# Patient Record
Sex: Female | Born: 1935 | Race: Black or African American | Hispanic: No | Marital: Single | Smoking: Never smoker
Health system: Southern US, Community
[De-identification: ages and names within clinical notes are randomized; demographics above are authoritative.]

## PROBLEM LIST (undated history)

## (undated) DIAGNOSIS — N183 Chronic kidney disease, stage 3 unspecified: Secondary | ICD-10-CM

## (undated) DIAGNOSIS — I255 Ischemic cardiomyopathy: Secondary | ICD-10-CM

## (undated) DIAGNOSIS — Z950 Presence of cardiac pacemaker: Secondary | ICD-10-CM

## (undated) DIAGNOSIS — E7800 Pure hypercholesterolemia, unspecified: Secondary | ICD-10-CM

## (undated) DIAGNOSIS — N289 Disorder of kidney and ureter, unspecified: Secondary | ICD-10-CM

## (undated) DIAGNOSIS — I350 Nonrheumatic aortic (valve) stenosis: Secondary | ICD-10-CM

## (undated) DIAGNOSIS — R011 Cardiac murmur, unspecified: Secondary | ICD-10-CM

## (undated) DIAGNOSIS — I2510 Atherosclerotic heart disease of native coronary artery without angina pectoris: Secondary | ICD-10-CM

## (undated) DIAGNOSIS — Z8719 Personal history of other diseases of the digestive system: Secondary | ICD-10-CM

## (undated) DIAGNOSIS — R0602 Shortness of breath: Secondary | ICD-10-CM

## (undated) DIAGNOSIS — I495 Sick sinus syndrome: Secondary | ICD-10-CM

## (undated) DIAGNOSIS — I4891 Unspecified atrial fibrillation: Secondary | ICD-10-CM

## (undated) DIAGNOSIS — I340 Nonrheumatic mitral (valve) insufficiency: Secondary | ICD-10-CM

## (undated) DIAGNOSIS — I499 Cardiac arrhythmia, unspecified: Secondary | ICD-10-CM

## (undated) DIAGNOSIS — I509 Heart failure, unspecified: Secondary | ICD-10-CM

## (undated) DIAGNOSIS — D649 Anemia, unspecified: Secondary | ICD-10-CM

## (undated) HISTORY — DX: Ischemic cardiomyopathy: I25.5

## (undated) HISTORY — DX: Nonrheumatic aortic (valve) stenosis: I35.0

## (undated) HISTORY — DX: Nonrheumatic mitral (valve) insufficiency: I34.0

## (undated) HISTORY — DX: Disorder of kidney and ureter, unspecified: N28.9

## (undated) HISTORY — PX: INSERT / REPLACE / REMOVE PACEMAKER: SUR710

---

## 1979-03-24 HISTORY — PX: CORONARY ARTERY BYPASS GRAFT: SHX141

## 1998-03-30 ENCOUNTER — Encounter: Payer: Self-pay | Admitting: Family Medicine

## 1998-03-30 ENCOUNTER — Ambulatory Visit (HOSPITAL_COMMUNITY): Admission: RE | Admit: 1998-03-30 | Discharge: 1998-03-30 | Payer: Self-pay | Admitting: Family Medicine

## 1998-12-29 ENCOUNTER — Ambulatory Visit (HOSPITAL_COMMUNITY): Admission: RE | Admit: 1998-12-29 | Discharge: 1998-12-29 | Payer: Self-pay | Admitting: Obstetrics & Gynecology

## 1999-04-12 ENCOUNTER — Ambulatory Visit (HOSPITAL_COMMUNITY): Admission: RE | Admit: 1999-04-12 | Discharge: 1999-04-12 | Payer: Self-pay | Admitting: Family Medicine

## 1999-04-12 ENCOUNTER — Encounter: Payer: Self-pay | Admitting: Family Medicine

## 2000-04-15 ENCOUNTER — Encounter: Payer: Self-pay | Admitting: Family Medicine

## 2000-04-15 ENCOUNTER — Ambulatory Visit (HOSPITAL_COMMUNITY): Admission: RE | Admit: 2000-04-15 | Discharge: 2000-04-15 | Payer: Self-pay | Admitting: Family Medicine

## 2001-04-18 ENCOUNTER — Encounter: Payer: Self-pay | Admitting: Family Medicine

## 2001-04-18 ENCOUNTER — Ambulatory Visit (HOSPITAL_COMMUNITY): Admission: RE | Admit: 2001-04-18 | Discharge: 2001-04-18 | Payer: Self-pay | Admitting: Family Medicine

## 2001-10-16 ENCOUNTER — Encounter: Admission: RE | Admit: 2001-10-16 | Discharge: 2001-10-16 | Payer: Self-pay | Admitting: Family Medicine

## 2002-01-20 HISTORY — PX: URETEROLITHOTOMY: SHX71

## 2002-02-19 ENCOUNTER — Ambulatory Visit (HOSPITAL_COMMUNITY): Admission: RE | Admit: 2002-02-19 | Discharge: 2002-02-19 | Payer: Self-pay | Admitting: Urology

## 2002-02-19 ENCOUNTER — Encounter: Payer: Self-pay | Admitting: Urology

## 2002-04-20 ENCOUNTER — Encounter: Payer: Self-pay | Admitting: Family Medicine

## 2002-04-20 ENCOUNTER — Ambulatory Visit (HOSPITAL_COMMUNITY): Admission: RE | Admit: 2002-04-20 | Discharge: 2002-04-20 | Payer: Self-pay | Admitting: Family Medicine

## 2003-04-16 ENCOUNTER — Other Ambulatory Visit: Admission: RE | Admit: 2003-04-16 | Discharge: 2003-04-16 | Payer: Self-pay | Admitting: Family Medicine

## 2003-04-22 ENCOUNTER — Ambulatory Visit (HOSPITAL_COMMUNITY): Admission: RE | Admit: 2003-04-22 | Discharge: 2003-04-22 | Payer: Self-pay | Admitting: Family Medicine

## 2003-04-22 ENCOUNTER — Encounter: Payer: Self-pay | Admitting: Family Medicine

## 2004-04-24 ENCOUNTER — Ambulatory Visit (HOSPITAL_COMMUNITY): Admission: RE | Admit: 2004-04-24 | Discharge: 2004-04-24 | Payer: Self-pay | Admitting: Family Medicine

## 2004-05-03 ENCOUNTER — Encounter (INDEPENDENT_AMBULATORY_CARE_PROVIDER_SITE_OTHER): Payer: Self-pay | Admitting: *Deleted

## 2004-05-03 ENCOUNTER — Encounter: Admission: RE | Admit: 2004-05-03 | Discharge: 2004-05-03 | Payer: Self-pay | Admitting: Family Medicine

## 2004-05-09 ENCOUNTER — Encounter (HOSPITAL_COMMUNITY): Admission: RE | Admit: 2004-05-09 | Discharge: 2004-08-07 | Payer: Self-pay | Admitting: General Surgery

## 2004-05-18 ENCOUNTER — Encounter: Admission: RE | Admit: 2004-05-18 | Discharge: 2004-05-18 | Payer: Self-pay | Admitting: General Surgery

## 2004-05-22 ENCOUNTER — Encounter: Admission: RE | Admit: 2004-05-22 | Discharge: 2004-05-22 | Payer: Self-pay | Admitting: General Surgery

## 2004-05-22 ENCOUNTER — Ambulatory Visit (HOSPITAL_COMMUNITY): Admission: RE | Admit: 2004-05-22 | Discharge: 2004-05-22 | Payer: Self-pay | Admitting: General Surgery

## 2004-05-22 ENCOUNTER — Ambulatory Visit (HOSPITAL_BASED_OUTPATIENT_CLINIC_OR_DEPARTMENT_OTHER): Admission: RE | Admit: 2004-05-22 | Discharge: 2004-05-22 | Payer: Self-pay | Admitting: General Surgery

## 2004-05-22 ENCOUNTER — Encounter (INDEPENDENT_AMBULATORY_CARE_PROVIDER_SITE_OTHER): Payer: Self-pay | Admitting: Specialist

## 2004-05-23 HISTORY — PX: OTHER SURGICAL HISTORY: SHX169

## 2004-05-26 ENCOUNTER — Other Ambulatory Visit: Admission: RE | Admit: 2004-05-26 | Discharge: 2004-05-26 | Payer: Self-pay | Admitting: Family Medicine

## 2004-06-09 ENCOUNTER — Encounter (INDEPENDENT_AMBULATORY_CARE_PROVIDER_SITE_OTHER): Payer: Self-pay | Admitting: Specialist

## 2004-06-09 ENCOUNTER — Ambulatory Visit (HOSPITAL_COMMUNITY): Admission: RE | Admit: 2004-06-09 | Discharge: 2004-06-09 | Payer: Self-pay | Admitting: General Surgery

## 2005-04-22 ENCOUNTER — Emergency Department (HOSPITAL_COMMUNITY): Admission: EM | Admit: 2005-04-22 | Discharge: 2005-04-22 | Payer: Self-pay | Admitting: Family Medicine

## 2005-04-25 ENCOUNTER — Encounter: Admission: RE | Admit: 2005-04-25 | Discharge: 2005-04-25 | Payer: Self-pay | Admitting: General Surgery

## 2005-07-24 ENCOUNTER — Encounter: Admission: RE | Admit: 2005-07-24 | Discharge: 2005-07-24 | Payer: Self-pay | Admitting: Family Medicine

## 2005-07-30 ENCOUNTER — Encounter: Admission: RE | Admit: 2005-07-30 | Discharge: 2005-07-30 | Payer: Self-pay | Admitting: Family Medicine

## 2005-08-10 ENCOUNTER — Encounter: Admission: RE | Admit: 2005-08-10 | Discharge: 2005-08-10 | Payer: Self-pay | Admitting: Family Medicine

## 2005-09-13 ENCOUNTER — Ambulatory Visit: Payer: Self-pay | Admitting: Oncology

## 2005-10-29 ENCOUNTER — Encounter: Admission: RE | Admit: 2005-10-29 | Discharge: 2005-10-29 | Payer: Self-pay | Admitting: Oncology

## 2006-01-21 ENCOUNTER — Inpatient Hospital Stay (HOSPITAL_COMMUNITY): Admission: RE | Admit: 2006-01-21 | Discharge: 2006-01-26 | Payer: Self-pay | Admitting: Neurosurgery

## 2006-01-22 ENCOUNTER — Ambulatory Visit: Payer: Self-pay | Admitting: Physical Medicine & Rehabilitation

## 2006-03-08 ENCOUNTER — Ambulatory Visit: Payer: Self-pay | Admitting: Oncology

## 2006-03-15 LAB — CBC WITH DIFFERENTIAL/PLATELET
Basophils Absolute: 0 10*3/uL (ref 0.0–0.1)
Eosinophils Absolute: 0.2 10*3/uL (ref 0.0–0.5)
HCT: 29.8 % — ABNORMAL LOW (ref 34.8–46.6)
HGB: 9.9 g/dL — ABNORMAL LOW (ref 11.6–15.9)
LYMPH%: 26.6 % (ref 14.0–48.0)
MCV: 92.7 fL (ref 81.0–101.0)
MONO#: 0.4 10*3/uL (ref 0.1–0.9)
MONO%: 7.5 % (ref 0.0–13.0)
NEUT#: 3.5 10*3/uL (ref 1.5–6.5)
NEUT%: 62.4 % (ref 39.6–76.8)
Platelets: 340 10*3/uL (ref 145–400)
WBC: 5.6 10*3/uL (ref 3.9–10.0)

## 2006-03-16 LAB — COMPREHENSIVE METABOLIC PANEL
BUN: 13 mg/dL (ref 6–23)
CO2: 25 mEq/L (ref 19–32)
Glucose, Bld: 112 mg/dL — ABNORMAL HIGH (ref 70–99)
Sodium: 143 mEq/L (ref 135–145)
Total Bilirubin: 0.6 mg/dL (ref 0.3–1.2)
Total Protein: 6.6 g/dL (ref 6.0–8.3)

## 2006-03-16 LAB — FERRITIN: Ferritin: 165 ng/mL (ref 10–291)

## 2006-03-16 LAB — IRON AND TIBC
Iron: 52 ug/dL (ref 42–145)
TIBC: 243 ug/dL — ABNORMAL LOW (ref 250–470)

## 2006-03-16 LAB — CANCER ANTIGEN 27.29: CA 27.29: 26 U/mL (ref 0–39)

## 2006-05-01 ENCOUNTER — Encounter: Admission: RE | Admit: 2006-05-01 | Discharge: 2006-05-01 | Payer: Self-pay | Admitting: General Surgery

## 2006-09-09 ENCOUNTER — Ambulatory Visit: Payer: Self-pay | Admitting: Oncology

## 2006-09-12 LAB — COMPREHENSIVE METABOLIC PANEL
ALT: 29 U/L (ref 0–35)
AST: 30 U/L (ref 0–37)
Albumin: 4.1 g/dL (ref 3.5–5.2)
CO2: 23 mEq/L (ref 19–32)
Calcium: 9 mg/dL (ref 8.4–10.5)
Chloride: 111 mEq/L (ref 96–112)
Creatinine, Ser: 0.82 mg/dL (ref 0.40–1.20)
Potassium: 3.8 mEq/L (ref 3.5–5.3)
Total Protein: 6.6 g/dL (ref 6.0–8.3)

## 2006-09-12 LAB — CBC WITH DIFFERENTIAL/PLATELET
Basophils Absolute: 0.1 10*3/uL (ref 0.0–0.1)
EOS%: 2.5 % (ref 0.0–7.0)
HCT: 30.5 % — ABNORMAL LOW (ref 34.8–46.6)
HGB: 10.3 g/dL — ABNORMAL LOW (ref 11.6–15.9)
MCH: 31.2 pg (ref 26.0–34.0)
MCHC: 33.6 g/dL (ref 32.0–36.0)
MCV: 92.9 fL (ref 81.0–101.0)
MONO%: 5.5 % (ref 0.0–13.0)
NEUT%: 67.2 % (ref 39.6–76.8)
RDW: 15.6 % — ABNORMAL HIGH (ref 11.3–14.5)

## 2006-09-12 LAB — CANCER ANTIGEN 27.29: CA 27.29: 22 U/mL (ref 0–39)

## 2006-09-12 LAB — IRON AND TIBC: TIBC: 286 ug/dL (ref 250–470)

## 2006-11-28 ENCOUNTER — Ambulatory Visit (HOSPITAL_COMMUNITY): Admission: RE | Admit: 2006-11-28 | Discharge: 2006-11-28 | Payer: Self-pay | Admitting: Cardiology

## 2007-05-09 ENCOUNTER — Encounter: Admission: RE | Admit: 2007-05-09 | Discharge: 2007-05-09 | Payer: Self-pay | Admitting: General Surgery

## 2007-09-09 ENCOUNTER — Ambulatory Visit: Payer: Self-pay | Admitting: Oncology

## 2007-09-11 LAB — COMPREHENSIVE METABOLIC PANEL
AST: 20 U/L (ref 0–37)
Albumin: 4 g/dL (ref 3.5–5.2)
Alkaline Phosphatase: 57 U/L (ref 39–117)
Calcium: 8.9 mg/dL (ref 8.4–10.5)
Chloride: 108 mEq/L (ref 96–112)
Potassium: 3.7 mEq/L (ref 3.5–5.3)
Sodium: 143 mEq/L (ref 135–145)
Total Protein: 6.5 g/dL (ref 6.0–8.3)

## 2007-09-11 LAB — CBC & DIFF AND RETIC
Basophils Absolute: 0.1 10*3/uL (ref 0.0–0.1)
Eosinophils Absolute: 0.2 10*3/uL (ref 0.0–0.5)
HGB: 11.5 g/dL — ABNORMAL LOW (ref 11.6–15.9)
LYMPH%: 25.4 % (ref 14.0–48.0)
MCV: 91.7 fL (ref 81.0–101.0)
MONO#: 0.4 10*3/uL (ref 0.1–0.9)
NEUT#: 3.5 10*3/uL (ref 1.5–6.5)
Platelets: 293 10*3/uL (ref 145–400)
RBC: 3.71 10*6/uL (ref 3.70–5.32)
RDW: 15.6 % — ABNORMAL HIGH (ref 11.3–14.5)
RETIC #: 30.4 10*3/uL (ref 19.7–115.1)
Retic %: 0.8 % (ref 0.4–2.3)
WBC: 5.5 10*3/uL (ref 3.9–10.0)

## 2008-05-10 ENCOUNTER — Encounter: Admission: RE | Admit: 2008-05-10 | Discharge: 2008-05-10 | Payer: Self-pay | Admitting: General Surgery

## 2008-09-07 ENCOUNTER — Ambulatory Visit: Payer: Self-pay | Admitting: Oncology

## 2008-11-03 ENCOUNTER — Ambulatory Visit: Payer: Self-pay | Admitting: Oncology

## 2008-11-05 LAB — COMPREHENSIVE METABOLIC PANEL
Alkaline Phosphatase: 46 U/L (ref 39–117)
BUN: 15 mg/dL (ref 6–23)
CO2: 24 mEq/L (ref 19–32)
Glucose, Bld: 93 mg/dL (ref 70–99)
Sodium: 144 mEq/L (ref 135–145)
Total Bilirubin: 0.9 mg/dL (ref 0.3–1.2)
Total Protein: 6.4 g/dL (ref 6.0–8.3)

## 2008-11-05 LAB — CBC WITH DIFFERENTIAL/PLATELET
Basophils Absolute: 0 10*3/uL (ref 0.0–0.1)
Eosinophils Absolute: 0.1 10*3/uL (ref 0.0–0.5)
HCT: 33.3 % — ABNORMAL LOW (ref 34.8–46.6)
HGB: 11 g/dL — ABNORMAL LOW (ref 11.6–15.9)
LYMPH%: 27.6 % (ref 14.0–49.7)
MCV: 94.1 fL (ref 79.5–101.0)
MONO#: 0.2 10*3/uL (ref 0.1–0.9)
MONO%: 3.6 % (ref 0.0–14.0)
NEUT#: 3.1 10*3/uL (ref 1.5–6.5)
NEUT%: 65.6 % (ref 38.4–76.8)
Platelets: 180 10*3/uL (ref 145–400)
WBC: 4.7 10*3/uL (ref 3.9–10.3)

## 2009-03-27 ENCOUNTER — Ambulatory Visit: Payer: Self-pay | Admitting: Internal Medicine

## 2009-03-27 ENCOUNTER — Inpatient Hospital Stay (HOSPITAL_COMMUNITY): Admission: EM | Admit: 2009-03-27 | Discharge: 2009-03-31 | Payer: Self-pay | Admitting: Emergency Medicine

## 2009-03-29 ENCOUNTER — Encounter (INDEPENDENT_AMBULATORY_CARE_PROVIDER_SITE_OTHER): Payer: Self-pay | Admitting: Cardiology

## 2009-03-30 HISTORY — PX: PACEMAKER INSERTION: SHX728

## 2009-03-31 ENCOUNTER — Encounter: Payer: Self-pay | Admitting: Internal Medicine

## 2009-04-07 ENCOUNTER — Encounter: Payer: Self-pay | Admitting: Internal Medicine

## 2009-04-14 ENCOUNTER — Encounter: Payer: Self-pay | Admitting: Internal Medicine

## 2009-04-14 ENCOUNTER — Ambulatory Visit: Payer: Self-pay

## 2009-05-06 ENCOUNTER — Ambulatory Visit: Payer: Self-pay | Admitting: Oncology

## 2009-05-10 LAB — CBC WITH DIFFERENTIAL/PLATELET
BASO%: 1 % (ref 0.0–2.0)
EOS%: 1.5 % (ref 0.0–7.0)
HGB: 11.3 g/dL — ABNORMAL LOW (ref 11.6–15.9)
MCH: 32.7 pg (ref 25.1–34.0)
MCHC: 33.8 g/dL (ref 31.5–36.0)
RBC: 3.45 10*6/uL — ABNORMAL LOW (ref 3.70–5.45)
RDW: 16.4 % — ABNORMAL HIGH (ref 11.2–14.5)
lymph#: 0.9 10*3/uL (ref 0.9–3.3)

## 2009-05-10 LAB — COMPREHENSIVE METABOLIC PANEL
Alkaline Phosphatase: 36 U/L — ABNORMAL LOW (ref 39–117)
CO2: 21 mEq/L (ref 19–32)
Creatinine, Ser: 1.18 mg/dL (ref 0.40–1.20)
Glucose, Bld: 137 mg/dL — ABNORMAL HIGH (ref 70–99)
Sodium: 143 mEq/L (ref 135–145)
Total Bilirubin: 0.9 mg/dL (ref 0.3–1.2)
Total Protein: 6 g/dL (ref 6.0–8.3)

## 2009-05-11 ENCOUNTER — Ambulatory Visit: Payer: Self-pay | Admitting: Internal Medicine

## 2009-05-11 ENCOUNTER — Encounter: Admission: RE | Admit: 2009-05-11 | Discharge: 2009-05-11 | Payer: Self-pay | Admitting: Oncology

## 2009-05-11 DIAGNOSIS — I472 Ventricular tachycardia, unspecified: Secondary | ICD-10-CM | POA: Insufficient documentation

## 2009-05-11 DIAGNOSIS — R259 Unspecified abnormal involuntary movements: Secondary | ICD-10-CM | POA: Insufficient documentation

## 2009-05-11 DIAGNOSIS — I428 Other cardiomyopathies: Secondary | ICD-10-CM | POA: Insufficient documentation

## 2009-05-11 DIAGNOSIS — I4821 Permanent atrial fibrillation: Secondary | ICD-10-CM

## 2009-05-17 LAB — CONVERTED CEMR LAB: TSH: 1.57 microintl units/mL (ref 0.35–5.50)

## 2009-05-24 ENCOUNTER — Ambulatory Visit: Payer: Self-pay | Admitting: Internal Medicine

## 2009-06-09 ENCOUNTER — Ambulatory Visit: Payer: Self-pay | Admitting: Internal Medicine

## 2009-06-09 DIAGNOSIS — I5032 Chronic diastolic (congestive) heart failure: Secondary | ICD-10-CM

## 2009-06-28 ENCOUNTER — Encounter (INDEPENDENT_AMBULATORY_CARE_PROVIDER_SITE_OTHER): Payer: Self-pay | Admitting: *Deleted

## 2009-06-28 LAB — CONVERTED CEMR LAB
AST: 40 units/L — ABNORMAL HIGH (ref 0–37)
Alkaline Phosphatase: 55 units/L (ref 39–117)
Basophils Absolute: 0.1 10*3/uL (ref 0.0–0.1)
Bilirubin, Direct: 0.2 mg/dL (ref 0.0–0.3)
GFR calc non Af Amer: 60.05 mL/min (ref >60)
Glucose, Bld: 109 mg/dL — ABNORMAL HIGH (ref 70–99)
HCT: 36.2 % (ref 36.0–46.0)
Lymphs Abs: 0.8 10*3/uL (ref 0.7–4.0)
MCHC: 33.3 g/dL (ref 30.0–36.0)
MCV: 99.4 fL (ref 78.0–100.0)
Monocytes Absolute: 0.2 10*3/uL (ref 0.1–1.0)
Neutro Abs: 7.5 10*3/uL (ref 1.4–7.7)
Platelets: 311 10*3/uL (ref 150.0–400.0)
Potassium: 3.1 meq/L — ABNORMAL LOW (ref 3.5–5.1)
RDW: 14.2 % (ref 11.5–14.6)
Sodium: 143 meq/L (ref 135–145)
Total Bilirubin: 1.2 mg/dL (ref 0.3–1.2)

## 2009-07-01 ENCOUNTER — Encounter: Admission: RE | Admit: 2009-07-01 | Discharge: 2009-07-01 | Payer: Self-pay | Admitting: Cardiology

## 2009-07-04 ENCOUNTER — Ambulatory Visit: Payer: Self-pay | Admitting: Internal Medicine

## 2009-07-04 DIAGNOSIS — I498 Other specified cardiac arrhythmias: Secondary | ICD-10-CM | POA: Insufficient documentation

## 2009-07-04 LAB — CONVERTED CEMR LAB
Calcium: 8.7 mg/dL (ref 8.4–10.5)
Creatinine, Ser: 1 mg/dL (ref 0.4–1.2)
GFR calc non Af Amer: 67.02 mL/min (ref >60)
Sodium: 145 meq/L (ref 135–145)

## 2010-02-20 ENCOUNTER — Encounter (INDEPENDENT_AMBULATORY_CARE_PROVIDER_SITE_OTHER): Payer: Self-pay | Admitting: *Deleted

## 2010-04-21 ENCOUNTER — Encounter (INDEPENDENT_AMBULATORY_CARE_PROVIDER_SITE_OTHER): Payer: Self-pay | Admitting: *Deleted

## 2010-05-15 ENCOUNTER — Encounter: Admission: RE | Admit: 2010-05-15 | Discharge: 2010-05-15 | Payer: Self-pay | Admitting: Family Medicine

## 2010-08-12 ENCOUNTER — Encounter: Payer: Self-pay | Admitting: Family Medicine

## 2010-08-18 ENCOUNTER — Encounter
Admission: RE | Admit: 2010-08-18 | Discharge: 2010-08-18 | Payer: Self-pay | Source: Home / Self Care | Attending: Family Medicine | Admitting: Family Medicine

## 2010-08-22 NOTE — Letter (Signed)
Summary: Device-Delinquent Check  [facility], Main Office  [address]   [place], [place] [address]   Phone: [phone]  Fax: [phone]     April 21, 2010 MRN: [id number]   [name] [address] [place], [place]  [address]   Dear Ms. Tadlock,  According to our records, you have not had your implanted device checked in the recommended period of time.  We are unable to determine appropriate device function without checking your device on a regular basis.  Please call our office to schedule an appointment with Dr Johney Frame ,  as soon as possible.  If you are having your device checked by another physician, please call us so that we may update our records.  Thank you,  Letta Moynahan, EMT  April 21, 2010 9:49 AM  [facility]

## 2010-08-22 NOTE — Letter (Signed)
Summary: Appointment - Reminder 2  [facility], Main Office  [address]   [place], [place] [address]   Phone: [phone]  Fax: [phone]     February 20, 2010 MRN: [id number]   NINFA GIANNELLI [address] [place], [place]  [address]   Dear Ms. Dennen,  Our records indicate that it is time to schedule a follow-up appointment.  Dr.Allred recommended that you follow up with [place] in June 2011. It is very important that we reach you to schedule this appointment. We look forward to participating in your health care needs. Please contact us at the number listed above at your earliest convenience to schedule your appointment.  If you are unable to make an appointment at this time, give [place] a call so we can update our records.     Sincerely,  Ruel Favors Scheduling Team

## 2010-08-23 HISTORY — PX: EXPLORATORY LAPAROTOMY W/ BOWEL RESECTION: SHX1544

## 2010-08-30 ENCOUNTER — Emergency Department (HOSPITAL_COMMUNITY): Payer: MEDICARE

## 2010-08-30 ENCOUNTER — Inpatient Hospital Stay (HOSPITAL_COMMUNITY)
Admission: EM | Admit: 2010-08-30 | Discharge: 2010-09-18 | DRG: 329 | Disposition: A | Discharge status: Skilled Nursing Facility | Payer: MEDICARE | Attending: Family Medicine | Admitting: Family Medicine

## 2010-08-30 DIAGNOSIS — I472 Ventricular tachycardia, unspecified: Secondary | ICD-10-CM | POA: Diagnosis not present

## 2010-08-30 DIAGNOSIS — N289 Disorder of kidney and ureter, unspecified: Secondary | ICD-10-CM | POA: Diagnosis not present

## 2010-08-30 DIAGNOSIS — A419 Sepsis, unspecified organism: Secondary | ICD-10-CM | POA: Diagnosis not present

## 2010-08-30 DIAGNOSIS — E876 Hypokalemia: Secondary | ICD-10-CM | POA: Diagnosis not present

## 2010-08-30 DIAGNOSIS — E785 Hyperlipidemia, unspecified: Secondary | ICD-10-CM | POA: Diagnosis present

## 2010-08-30 DIAGNOSIS — I2589 Other forms of chronic ischemic heart disease: Secondary | ICD-10-CM | POA: Diagnosis present

## 2010-08-30 DIAGNOSIS — I2510 Atherosclerotic heart disease of native coronary artery without angina pectoris: Secondary | ICD-10-CM | POA: Diagnosis present

## 2010-08-30 DIAGNOSIS — R5381 Other malaise: Secondary | ICD-10-CM | POA: Diagnosis not present

## 2010-08-30 DIAGNOSIS — D62 Acute posthemorrhagic anemia: Secondary | ICD-10-CM | POA: Diagnosis not present

## 2010-08-30 DIAGNOSIS — J9600 Acute respiratory failure, unspecified whether with hypoxia or hypercapnia: Secondary | ICD-10-CM | POA: Diagnosis not present

## 2010-08-30 DIAGNOSIS — R6520 Severe sepsis without septic shock: Secondary | ICD-10-CM | POA: Diagnosis not present

## 2010-08-30 DIAGNOSIS — K659 Peritonitis, unspecified: Secondary | ICD-10-CM | POA: Diagnosis not present

## 2010-08-30 DIAGNOSIS — D72829 Elevated white blood cell count, unspecified: Secondary | ICD-10-CM | POA: Diagnosis present

## 2010-08-30 DIAGNOSIS — I10 Essential (primary) hypertension: Secondary | ICD-10-CM | POA: Diagnosis present

## 2010-08-30 DIAGNOSIS — I5043 Acute on chronic combined systolic (congestive) and diastolic (congestive) heart failure: Secondary | ICD-10-CM | POA: Diagnosis not present

## 2010-08-30 DIAGNOSIS — K560 Paralytic ileus: Secondary | ICD-10-CM | POA: Diagnosis not present

## 2010-08-30 DIAGNOSIS — N179 Acute kidney failure, unspecified: Secondary | ICD-10-CM | POA: Diagnosis not present

## 2010-08-30 DIAGNOSIS — J189 Pneumonia, unspecified organism: Secondary | ICD-10-CM | POA: Diagnosis not present

## 2010-08-30 DIAGNOSIS — I4891 Unspecified atrial fibrillation: Secondary | ICD-10-CM | POA: Diagnosis present

## 2010-08-30 DIAGNOSIS — I359 Nonrheumatic aortic valve disorder, unspecified: Secondary | ICD-10-CM | POA: Diagnosis present

## 2010-08-30 DIAGNOSIS — I4729 Other ventricular tachycardia: Secondary | ICD-10-CM | POA: Diagnosis not present

## 2010-08-30 DIAGNOSIS — Z7901 Long term (current) use of anticoagulants: Secondary | ICD-10-CM

## 2010-08-30 DIAGNOSIS — Z950 Presence of cardiac pacemaker: Secondary | ICD-10-CM

## 2010-08-30 DIAGNOSIS — K559 Vascular disorder of intestine, unspecified: Principal | ICD-10-CM | POA: Diagnosis present

## 2010-08-30 DIAGNOSIS — I509 Heart failure, unspecified: Secondary | ICD-10-CM | POA: Diagnosis present

## 2010-08-30 DIAGNOSIS — Z951 Presence of aortocoronary bypass graft: Secondary | ICD-10-CM

## 2010-08-30 DIAGNOSIS — E43 Unspecified severe protein-calorie malnutrition: Secondary | ICD-10-CM | POA: Diagnosis not present

## 2010-08-30 LAB — DIFFERENTIAL
Basophils Absolute: 0 10*3/uL (ref 0.0–0.1)
Basophils Relative: 0 % (ref 0–1)
Eosinophils Relative: 0 % (ref 0–5)
Lymphocytes Relative: 12 % (ref 12–46)
Neutro Abs: 8 10*3/uL — ABNORMAL HIGH (ref 1.7–7.7)

## 2010-08-30 LAB — LACTIC ACID, PLASMA: Lactic Acid, Venous: 2.5 mmol/L — ABNORMAL HIGH (ref 0.5–2.2)

## 2010-08-30 LAB — TROPONIN I: Troponin I: 0.01 ng/mL (ref 0.00–0.06)

## 2010-08-30 LAB — POCT CARDIAC MARKERS: Myoglobin, poc: 72.5 ng/mL (ref 12–200)

## 2010-08-30 LAB — URINALYSIS, ROUTINE W REFLEX MICROSCOPIC
Bilirubin Urine: NEGATIVE
Hgb urine dipstick: NEGATIVE
Ketones, ur: 15 mg/dL — AB
Nitrite: NEGATIVE
Protein, ur: NEGATIVE mg/dL
Specific Gravity, Urine: 1.014 (ref 1.005–1.030)
Urobilinogen, UA: 1 mg/dL (ref 0.0–1.0)

## 2010-08-30 LAB — CBC
HCT: 30.7 % — ABNORMAL LOW (ref 36.0–46.0)
Hemoglobin: 9.9 g/dL — ABNORMAL LOW (ref 12.0–15.0)
MCHC: 32.2 g/dL (ref 30.0–36.0)
RDW: 15.2 % (ref 11.5–15.5)
WBC: 9.6 10*3/uL (ref 4.0–10.5)

## 2010-08-30 LAB — CK TOTAL AND CKMB (NOT AT ARMC)
CK, MB: 1.2 ng/mL (ref 0.3–4.0)
Relative Index: INVALID (ref 0.0–2.5)
Total CK: 99 U/L (ref 7–177)

## 2010-08-30 LAB — COMPREHENSIVE METABOLIC PANEL
Albumin: 3.2 g/dL — ABNORMAL LOW (ref 3.5–5.2)
Alkaline Phosphatase: 85 U/L (ref 39–117)
BUN: 25 mg/dL — ABNORMAL HIGH (ref 6–23)
Calcium: 9.1 mg/dL (ref 8.4–10.5)
Glucose, Bld: 191 mg/dL — ABNORMAL HIGH (ref 70–99)
Potassium: 4.6 mEq/L (ref 3.5–5.1)
Total Protein: 6.7 g/dL (ref 6.0–8.3)

## 2010-08-30 LAB — LIPASE, BLOOD: Lipase: 16 U/L (ref 11–59)

## 2010-08-30 LAB — APTT: aPTT: 31 seconds (ref 24–37)

## 2010-08-30 LAB — PROTIME-INR
INR: 2.09 — ABNORMAL HIGH (ref 0.00–1.49)
Prothrombin Time: 23.6 seconds — ABNORMAL HIGH (ref 11.6–15.2)

## 2010-08-30 LAB — BRAIN NATRIURETIC PEPTIDE: Pro B Natriuretic peptide (BNP): 1023 pg/mL — ABNORMAL HIGH (ref 0.0–100.0)

## 2010-08-31 ENCOUNTER — Inpatient Hospital Stay (HOSPITAL_COMMUNITY): Payer: MEDICARE

## 2010-08-31 ENCOUNTER — Other Ambulatory Visit: Payer: Self-pay | Admitting: General Surgery

## 2010-08-31 LAB — PROTIME-INR
INR: 3.81 — ABNORMAL HIGH (ref 0.00–1.49)
INR: 1.92 — ABNORMAL HIGH (ref 0.00–1.49)
Prothrombin Time: 22.1 seconds — ABNORMAL HIGH (ref 11.6–15.2)

## 2010-08-31 LAB — GLUCOSE, CAPILLARY
Glucose-Capillary: 143 mg/dL — ABNORMAL HIGH (ref 70–99)
Glucose-Capillary: 128 mg/dL — ABNORMAL HIGH (ref 70–99)

## 2010-08-31 LAB — BASIC METABOLIC PANEL
Calcium: 8 mg/dL — ABNORMAL LOW (ref 8.4–10.5)
GFR calc Af Amer: 36 mL/min — ABNORMAL LOW (ref >60)
GFR calc non Af Amer: 30 mL/min — ABNORMAL LOW (ref >60)
Glucose, Bld: 135 mg/dL — ABNORMAL HIGH (ref 70–99)
Potassium: 3.9 mEq/L (ref 3.5–5.1)
Sodium: 138 mEq/L (ref 135–145)

## 2010-08-31 LAB — COMPREHENSIVE METABOLIC PANEL
ALT: 22 U/L (ref 0–35)
AST: 34 U/L (ref 0–37)
Albumin: 2.7 g/dL — ABNORMAL LOW (ref 3.5–5.2)
CO2: 26 mEq/L (ref 19–32)
Calcium: 8.8 mg/dL (ref 8.4–10.5)
Chloride: 103 mEq/L (ref 96–112)
Creatinine, Ser: 1.61 mg/dL — ABNORMAL HIGH (ref 0.4–1.2)
GFR calc Af Amer: 36 mL/min — ABNORMAL LOW (ref >60)
Sodium: 141 mEq/L (ref 135–145)
Total Bilirubin: 1.1 mg/dL (ref 0.3–1.2)

## 2010-08-31 LAB — SURGICAL PCR SCREEN
MRSA, PCR: NEGATIVE
Staphylococcus aureus: NEGATIVE

## 2010-08-31 LAB — CBC
HCT: 29.8 % — ABNORMAL LOW (ref 36.0–46.0)
HCT: 26 % — ABNORMAL LOW (ref 36.0–46.0)
Hemoglobin: 8.3 g/dL — ABNORMAL LOW (ref 12.0–15.0)
MCV: 97.1 fL (ref 78.0–100.0)
RBC: 3.07 MIL/uL — ABNORMAL LOW (ref 3.87–5.11)
RBC: 2.65 MIL/uL — ABNORMAL LOW (ref 3.87–5.11)
RDW: 15.1 % (ref 11.5–15.5)
WBC: 24.4 10*3/uL — ABNORMAL HIGH (ref 4.0–10.5)
WBC: 16.1 10*3/uL — ABNORMAL HIGH (ref 4.0–10.5)

## 2010-08-31 LAB — POCT I-STAT 3, ART BLOOD GAS (G3+)
pCO2 arterial: 45.1 mmHg — ABNORMAL HIGH (ref 35.0–45.0)
pH, Arterial: 7.359 (ref 7.350–7.400)

## 2010-08-31 LAB — APTT: aPTT: 45 seconds — ABNORMAL HIGH (ref 24–37)

## 2010-09-01 ENCOUNTER — Inpatient Hospital Stay (HOSPITAL_COMMUNITY): Payer: MEDICARE

## 2010-09-01 DIAGNOSIS — J9600 Acute respiratory failure, unspecified whether with hypoxia or hypercapnia: Secondary | ICD-10-CM

## 2010-09-01 DIAGNOSIS — R579 Shock, unspecified: Secondary | ICD-10-CM

## 2010-09-01 DIAGNOSIS — A419 Sepsis, unspecified organism: Secondary | ICD-10-CM

## 2010-09-01 LAB — PROTIME-INR
INR: 1.74 — ABNORMAL HIGH (ref 0.00–1.49)
Prothrombin Time: 20.5 seconds — ABNORMAL HIGH (ref 11.6–15.2)
Prothrombin Time: 21.2 seconds — ABNORMAL HIGH (ref 11.6–15.2)

## 2010-09-01 LAB — POCT I-STAT 3, ART BLOOD GAS (G3+)
O2 Saturation: 99 %
pCO2 arterial: 34.8 mmHg — ABNORMAL LOW (ref 35.0–45.0)
pH, Arterial: 7.454 — ABNORMAL HIGH (ref 7.350–7.400)

## 2010-09-01 LAB — CBC
HCT: 22.9 % — ABNORMAL LOW (ref 36.0–46.0)
MCH: 31.8 pg (ref 26.0–34.0)
MCH: 31.8 pg (ref 26.0–34.0)
MCHC: 33 g/dL (ref 30.0–36.0)
MCHC: 32.3 g/dL (ref 30.0–36.0)
MCV: 96.2 fL (ref 78.0–100.0)
MCV: 96.2 fL (ref 78.0–100.0)
Platelets: 232 10*3/uL (ref 150–400)
Platelets: 215 10*3/uL (ref 150–400)
Platelets: 221 10*3/uL (ref 150–400)
RBC: 2.42 MIL/uL — ABNORMAL LOW (ref 3.87–5.11)
RDW: 15.2 % (ref 11.5–15.5)
RDW: 15.3 % (ref 11.5–15.5)

## 2010-09-01 LAB — COMPREHENSIVE METABOLIC PANEL
ALT: 22 U/L (ref 0–35)
AST: 41 U/L — ABNORMAL HIGH (ref 0–37)
Albumin: 2.3 g/dL — ABNORMAL LOW (ref 3.5–5.2)
Calcium: 8 mg/dL — ABNORMAL LOW (ref 8.4–10.5)
Chloride: 108 mEq/L (ref 96–112)
Creatinine, Ser: 1.71 mg/dL — ABNORMAL HIGH (ref 0.4–1.2)
GFR calc Af Amer: 34 mL/min — ABNORMAL LOW (ref >60)
Sodium: 140 mEq/L (ref 135–145)

## 2010-09-01 LAB — PREPARE FRESH FROZEN PLASMA: Unit division: 0

## 2010-09-01 LAB — BASIC METABOLIC PANEL
BUN: 33 mg/dL — ABNORMAL HIGH (ref 6–23)
BUN: 35 mg/dL — ABNORMAL HIGH (ref 6–23)
CO2: 23 mEq/L (ref 19–32)
CO2: 24 mEq/L (ref 19–32)
Calcium: 8 mg/dL — ABNORMAL LOW (ref 8.4–10.5)
Chloride: 109 mEq/L (ref 96–112)
Creatinine, Ser: 1.77 mg/dL — ABNORMAL HIGH (ref 0.4–1.2)
Creatinine, Ser: 1.78 mg/dL — ABNORMAL HIGH (ref 0.4–1.2)
GFR calc non Af Amer: 27 mL/min — ABNORMAL LOW (ref >60)
Glucose, Bld: 104 mg/dL — ABNORMAL HIGH (ref 70–99)
Sodium: 139 mEq/L (ref 135–145)

## 2010-09-01 LAB — GLUCOSE, CAPILLARY
Glucose-Capillary: 106 mg/dL — ABNORMAL HIGH (ref 70–99)
Glucose-Capillary: 81 mg/dL (ref 70–99)
Glucose-Capillary: 96 mg/dL (ref 70–99)

## 2010-09-01 LAB — URINE MICROSCOPIC-ADD ON

## 2010-09-01 LAB — LACTIC ACID, PLASMA: Lactic Acid, Venous: 1.8 mmol/L (ref 0.5–2.2)

## 2010-09-01 LAB — URINALYSIS, ROUTINE W REFLEX MICROSCOPIC
Bilirubin Urine: NEGATIVE
Protein, ur: 30 mg/dL — AB
Urine Glucose, Fasting: NEGATIVE mg/dL

## 2010-09-01 LAB — OSMOLALITY, URINE: Osmolality, Ur: 477 mOsm/kg (ref 390–1090)

## 2010-09-01 LAB — BRAIN NATRIURETIC PEPTIDE: Pro B Natriuretic peptide (BNP): 2708 pg/mL — ABNORMAL HIGH (ref 0.0–100.0)

## 2010-09-02 ENCOUNTER — Inpatient Hospital Stay (HOSPITAL_COMMUNITY): Payer: MEDICARE

## 2010-09-02 LAB — POCT I-STAT 3, ART BLOOD GAS (G3+)
TCO2: 24 mmol/L (ref 0–100)
pCO2 arterial: 37.6 mmHg (ref 35.0–45.0)
pH, Arterial: 7.395 (ref 7.350–7.400)

## 2010-09-02 LAB — GLUCOSE, CAPILLARY
Glucose-Capillary: 86 mg/dL (ref 70–99)
Glucose-Capillary: 86 mg/dL (ref 70–99)
Glucose-Capillary: 115 mg/dL — ABNORMAL HIGH (ref 70–99)

## 2010-09-02 LAB — CBC
HCT: 20 % — ABNORMAL LOW (ref 36.0–46.0)
Hemoglobin: 8.7 g/dL — ABNORMAL LOW (ref 12.0–15.0)
MCV: 95.2 fL (ref 78.0–100.0)
Platelets: 219 10*3/uL (ref 150–400)
RBC: 2.1 MIL/uL — ABNORMAL LOW (ref 3.87–5.11)
RBC: 2.78 MIL/uL — ABNORMAL LOW (ref 3.87–5.11)
WBC: 11.4 10*3/uL — ABNORMAL HIGH (ref 4.0–10.5)
WBC: 16.7 10*3/uL — ABNORMAL HIGH (ref 4.0–10.5)

## 2010-09-02 LAB — BRAIN NATRIURETIC PEPTIDE: Pro B Natriuretic peptide (BNP): 361 pg/mL — ABNORMAL HIGH (ref 0.0–100.0)

## 2010-09-02 LAB — BASIC METABOLIC PANEL
BUN: 37 mg/dL — ABNORMAL HIGH (ref 6–23)
CO2: 22 mEq/L (ref 19–32)
Chloride: 112 mEq/L (ref 96–112)
Glucose, Bld: 93 mg/dL (ref 70–99)
Potassium: 3.8 mEq/L (ref 3.5–5.1)

## 2010-09-03 ENCOUNTER — Inpatient Hospital Stay (HOSPITAL_COMMUNITY): Payer: MEDICARE

## 2010-09-03 LAB — BASIC METABOLIC PANEL
BUN: 33 mg/dL — ABNORMAL HIGH (ref 6–23)
Calcium: 7.9 mg/dL — ABNORMAL LOW (ref 8.4–10.5)
Chloride: 111 mEq/L (ref 96–112)
Chloride: 109 mEq/L (ref 96–112)
Creatinine, Ser: 1.55 mg/dL — ABNORMAL HIGH (ref 0.4–1.2)
GFR calc Af Amer: 38 mL/min — ABNORMAL LOW (ref >60)
GFR calc non Af Amer: 33 mL/min — ABNORMAL LOW (ref >60)
Glucose, Bld: 117 mg/dL — ABNORMAL HIGH (ref 70–99)
Potassium: 3.3 mEq/L — ABNORMAL LOW (ref 3.5–5.1)
Sodium: 142 mEq/L (ref 135–145)

## 2010-09-03 LAB — PHOSPHORUS: Phosphorus: 3.3 mg/dL (ref 2.3–4.6)

## 2010-09-03 LAB — GLUCOSE, CAPILLARY
Glucose-Capillary: 111 mg/dL — ABNORMAL HIGH (ref 70–99)
Glucose-Capillary: 97 mg/dL (ref 70–99)
Glucose-Capillary: 112 mg/dL — ABNORMAL HIGH (ref 70–99)
Glucose-Capillary: 92 mg/dL (ref 70–99)

## 2010-09-03 LAB — CULTURE, RESPIRATORY W GRAM STAIN

## 2010-09-03 LAB — CBC
MCH: 31.4 pg (ref 26.0–34.0)
MCHC: 33.3 g/dL (ref 30.0–36.0)
MCV: 94.1 fL (ref 78.0–100.0)
Platelets: 213 10*3/uL (ref 150–400)
RBC: 2.55 MIL/uL — ABNORMAL LOW (ref 3.87–5.11)

## 2010-09-03 LAB — MAGNESIUM: Magnesium: 2.1 mg/dL (ref 1.5–2.5)

## 2010-09-03 NOTE — H&P (Signed)
NAMEJASMAINE, [name]:  [id number]  MEDICAL RECORD NO.:  [id number]           PATIENT TYPE:  LOCATION:                                 FACILITY:  PHYSICIAN:  Vania Rea, M.D.      DATE OF BIRTH:  DATE OF ADMISSION:  08/30/2010 DATE OF DISCHARGE:                             HISTORY & PHYSICAL   PRIMARY CARE PHYSICIAN:  Dr. Charlesetta Shanks at [facility].  CARDIOLOGIST:  Thereasa Solo. Little, MD  CHIEF COMPLAINT:  Abdominal pain and swelling since 2 o'clock this morning.  HISTORY OF PRESENT ILLNESS:  This is a 77 year old African American lady who is very hard of hearing, but does not suffer with dementia in fact she is still driving, teaches at a daycare and volunteers at the [facility].  She does have a history of hypertension, chronic atrial fibrillation, questionable coronary artery disease status post CABG and she had a pacemaker put in 2010 for tachybrady syndrome.  The patient has been feeling quite healthy and with no problems until she woke at about 2:00 a.m. this morning with severe right lower quadrant pain.  It progressed to swelling.  She was associated with nausea, but no vomiting.  She denied any colicky symptoms.  She has been passing gas this morning.  Because the patient is very hard of hearing it is unable to get a history of a colonoscopy, but patient's niece who is present says she has never had a colonoscopy.  The patient has never had any abdominal surgery.  She has been no passage of bloody or black stool. No diarrhea as noted.  There is no history of weight loss.  PAST MEDICAL HISTORY:  Coronary artery disease status post CABG in 1983, hypertension, hyperlipidemia, permanent atrial fibrillation, tachybrady syndrome, history of ventricular tachycardia status post pacemaker placement.  PAST SURGICAL HISTORY:  Lumpectomy by Dr. Leonie Man in 2005, cystoscopy by Dr. Lindaann Slough, ureteroscopy, laser stone  extraction. No history of abdominal surgery.  MEDICATIONS: 1. Amiodarone 200 mg twice daily. 2. Coumadin 5 mg daily. 3. Potassium 10 mEq daily. 4. Lasix 40 mEq daily. 5. Toprol-XL 25 mg daily. 6. Zetia 10 mg daily. 7. Iron tablets daily.  ALLERGIES:  ASPIRIN and PENICILLIN.  SOCIAL HISTORY:  No history of tobacco, alcohol or illicit drug use. She works as a Runner, broadcasting/film/video.  She also volunteers at [facility].  She continues to drive.  She ambulates without difficulty.  She is extremely hard-of-hearing, wears a bilateral hearing aids.  FAMILY HISTORY:  She does not know much about the medical problems in her family.  REVIEW OF SYSTEMS:  Other than noted above, significant only for no urine passed since cardiac catheterization.  PHYSICAL EXAMINATION:  GENERAL:  Small-framed elderly African American lady lying in the stretcher, dozing off. VITAL SIGNS:  Temperature is 97.2, pulse 60, respirations 16, blood pressure 148/66.  She is saturating at 99% on room air. HEENT:  Pupils are round and equal.  Mucous membranes are pink, dry, anicteric. NECK:  No cervical lymphadenopathy or thyromegaly.  No carotid bruit. CHEST:  Clear to auscultation bilaterally. CARDIOVASCULAR:  Regular rhythm.  No murmur heard. ABDOMEN:  Upper part is soft.  The lower abdomen is markedly distended and she is hard and tender, more so on the right.  Bowel sounds were not heard for the time that I listened. EXTREMITIES:  Without edema.  Toes are somewhat cold, but there is no duskiness or cyanosis.  She has arthritic deformity of the hands, knees, ankles. CENTRAL NERVOUS SYSTEM:  Apart from being extremely hard of hearing, cranial nerves II through XII are grossly intact.  She has no focal lateralizing signs.  LABORATORY DATA:  White count is 9.6, hemoglobin 9.9, MCV 98.4, platelets 371.  She had 83% neutrophils.  Her sodium is 141, potassium 4.6, chloride 102, CO2 of 25, glucose 191, BUN 25,  creatinine 1.8, total bilirubin is 1.3.  AST is slightly elevated at 38, albumin is 3.2, calcium 9.1.  Liver functions are otherwise unremarkable.  Her lipase is normal at 16.  Her lactic acid is only slightly elevated at 2.5. Cardiac enzymes are completely normal.  Troponin is 0.01.  Urinalysis shows 15 ketones, proteins, and blood negative.  Nitrite and leukocyte esterase negative.  B-type natriuretic peptide was elevated of 1023.  DIAGNOSTIC STUDIES:  CT scan was done without contrast that shows extensive atherosclerosis, but no air in the wall of the bowel suggestive of partial small-bowel obstruction with small-to-moderate amount of abdominal and pelvic ascites, borderline right hydronephrosis, cardiomegaly and probable pulmonary edema and bibasilar atelectasis, small bilateral renal calculi.  ASSESSMENT: 1. Partial small-bowel obstruction. 2. Atrial fibrillation with chronic Coumadin anticoagulation. 3. Atherosclerotic disease. 4. Mild hydronephrosis. 5. Coronary artery disease. 6. Hypertension. 7. Hyperlipidemia.  PLAN: 1. We will admit this lady for hydration and management of small-bowel     obstruction.  We will consult Surgery, but because of this lady's     age and fact that she has never had a colonoscopy, never had     surgery, there is a high likelihood that this obstruction is being     caused by a mass.  We will hydrate her cautiously despite evidence     of pulmonary edema.  We will monitor oxygenation.  We will get a 2-     D echo and if her renal function improves, we may get a CT scan     with contrast, but she will eventually need surgery to identify the     cause of this obstruction. 2. We will hold her Coumadin for now and allow it to drift down.     Other plans as per orders.     Vania Rea, M.D.     LC/MEDQ  D:  08/30/2010  T:  08/30/2010  Job:  [id number]  cc:   Thereasa Solo. Little, M.D.  Electronically Signed by Vania Rea M.D. on  09/03/2010 01:18:40 AM

## 2010-09-03 NOTE — H&P (Signed)
  Duplicated dictation Electronically Signed by Vania Rea M.D. on 09/03/2010 01:19:09 AM

## 2010-09-04 ENCOUNTER — Inpatient Hospital Stay (HOSPITAL_COMMUNITY): Payer: MEDICARE

## 2010-09-04 LAB — CBC
HCT: 25.8 % — ABNORMAL LOW (ref 36.0–46.0)
Hemoglobin: 8.4 g/dL — ABNORMAL LOW (ref 12.0–15.0)
MCH: 31.2 pg (ref 26.0–34.0)
MCV: 95.9 fL (ref 78.0–100.0)
RBC: 2.69 MIL/uL — ABNORMAL LOW (ref 3.87–5.11)

## 2010-09-04 LAB — GLUCOSE, CAPILLARY
Glucose-Capillary: 97 mg/dL (ref 70–99)
Glucose-Capillary: 101 mg/dL — ABNORMAL HIGH (ref 70–99)

## 2010-09-04 LAB — CROSSMATCH
Unit division: 0
Unit division: 0

## 2010-09-04 LAB — BASIC METABOLIC PANEL
BUN: 29 mg/dL — ABNORMAL HIGH (ref 6–23)
Chloride: 113 mEq/L — ABNORMAL HIGH (ref 96–112)
Creatinine, Ser: 1.37 mg/dL — ABNORMAL HIGH (ref 0.4–1.2)
Glucose, Bld: 95 mg/dL (ref 70–99)
Potassium: 3.2 mEq/L — ABNORMAL LOW (ref 3.5–5.1)

## 2010-09-04 LAB — PROTIME-INR: Prothrombin Time: 22.4 seconds — ABNORMAL HIGH (ref 11.6–15.2)

## 2010-09-05 LAB — RENAL FUNCTION PANEL
Albumin: 2.1 g/dL — ABNORMAL LOW (ref 3.5–5.2)
CO2: 29 mEq/L (ref 19–32)
Calcium: 8.3 mg/dL — ABNORMAL LOW (ref 8.4–10.5)
GFR calc Af Amer: 47 mL/min — ABNORMAL LOW (ref >60)
GFR calc non Af Amer: 39 mL/min — ABNORMAL LOW (ref >60)
Phosphorus: 2.4 mg/dL (ref 2.3–4.6)
Sodium: 147 mEq/L — ABNORMAL HIGH (ref 135–145)

## 2010-09-05 LAB — PROTIME-INR: INR: 3.11 — ABNORMAL HIGH (ref 0.00–1.49)

## 2010-09-05 LAB — PROCALCITONIN: Procalcitonin: 0.95 ng/mL

## 2010-09-06 ENCOUNTER — Inpatient Hospital Stay (HOSPITAL_COMMUNITY): Payer: MEDICARE

## 2010-09-06 DIAGNOSIS — K55059 Acute (reversible) ischemia of intestine, part and extent unspecified: Secondary | ICD-10-CM

## 2010-09-06 DIAGNOSIS — R5381 Other malaise: Secondary | ICD-10-CM

## 2010-09-06 LAB — BASIC METABOLIC PANEL
BUN: 28 mg/dL — ABNORMAL HIGH (ref 6–23)
Calcium: 8 mg/dL — ABNORMAL LOW (ref 8.4–10.5)
Creatinine, Ser: 1.27 mg/dL — ABNORMAL HIGH (ref 0.4–1.2)
GFR calc non Af Amer: 40 mL/min — ABNORMAL LOW (ref >60)
Glucose, Bld: 116 mg/dL — ABNORMAL HIGH (ref 70–99)

## 2010-09-06 LAB — PROTIME-INR
INR: 4.29 — ABNORMAL HIGH (ref 0.00–1.49)
Prothrombin Time: 41.1 seconds — ABNORMAL HIGH (ref 11.6–15.2)

## 2010-09-06 LAB — CBC
Hemoglobin: 9.5 g/dL — ABNORMAL LOW (ref 12.0–15.0)
MCH: 31.1 pg (ref 26.0–34.0)
MCHC: 32.4 g/dL (ref 30.0–36.0)
Platelets: 315 10*3/uL (ref 150–400)
RDW: 15.6 % — ABNORMAL HIGH (ref 11.5–15.5)

## 2010-09-06 LAB — MAGNESIUM: Magnesium: 1.9 mg/dL (ref 1.5–2.5)

## 2010-09-07 LAB — BASIC METABOLIC PANEL
BUN: 22 mg/dL (ref 6–23)
CO2: 27 mEq/L (ref 19–32)
Calcium: 8.5 mg/dL (ref 8.4–10.5)
GFR calc non Af Amer: 37 mL/min — ABNORMAL LOW (ref >60)
Glucose, Bld: 157 mg/dL — ABNORMAL HIGH (ref 70–99)
Sodium: 139 mEq/L (ref 135–145)

## 2010-09-07 LAB — CBC
HCT: 27.4 % — ABNORMAL LOW (ref 36.0–46.0)
Hemoglobin: 9.1 g/dL — ABNORMAL LOW (ref 12.0–15.0)
MCHC: 33.2 g/dL (ref 30.0–36.0)
MCV: 94.8 fL (ref 78.0–100.0)
RDW: 15.2 % (ref 11.5–15.5)

## 2010-09-07 LAB — CULTURE, BLOOD (ROUTINE X 2): Culture  Setup Time: 201202100216

## 2010-09-08 ENCOUNTER — Inpatient Hospital Stay (HOSPITAL_COMMUNITY): Payer: MEDICARE

## 2010-09-08 LAB — CBC
HCT: 21.8 % — ABNORMAL LOW (ref 36.0–46.0)
HCT: 25.5 % — ABNORMAL LOW (ref 36.0–46.0)
Hemoglobin: 7.3 g/dL — ABNORMAL LOW (ref 12.0–15.0)
MCH: 31.6 pg (ref 26.0–34.0)
MCH: 31.2 pg (ref 26.0–34.0)
MCHC: 33.5 g/dL (ref 30.0–36.0)
MCHC: 32.9 g/dL (ref 30.0–36.0)
MCV: 94.8 fL (ref 78.0–100.0)
Platelets: 322 10*3/uL (ref 150–400)
RDW: 15.5 % (ref 11.5–15.5)
RDW: 15.6 % — ABNORMAL HIGH (ref 11.5–15.5)

## 2010-09-08 LAB — BASIC METABOLIC PANEL
BUN: 28 mg/dL — ABNORMAL HIGH (ref 6–23)
Chloride: 104 mEq/L (ref 96–112)
Creatinine, Ser: 1.35 mg/dL — ABNORMAL HIGH (ref 0.4–1.2)
Glucose, Bld: 124 mg/dL — ABNORMAL HIGH (ref 70–99)
Potassium: 3.6 mEq/L (ref 3.5–5.1)

## 2010-09-08 LAB — PROTIME-INR
INR: 5.4 (ref 0.00–1.49)
Prothrombin Time: 49.1 seconds — ABNORMAL HIGH (ref 11.6–15.2)

## 2010-09-09 LAB — COMPREHENSIVE METABOLIC PANEL
ALT: 16 U/L (ref 0–35)
CO2: 29 mEq/L (ref 19–32)
Calcium: 7.9 mg/dL — ABNORMAL LOW (ref 8.4–10.5)
Creatinine, Ser: 1.41 mg/dL — ABNORMAL HIGH (ref 0.4–1.2)
GFR calc non Af Amer: 35 mL/min — ABNORMAL LOW (ref >60)
Glucose, Bld: 103 mg/dL — ABNORMAL HIGH (ref 70–99)
Sodium: 136 mEq/L (ref 135–145)

## 2010-09-09 LAB — CBC
MCH: 30.6 pg (ref 26.0–34.0)
MCV: 89 fL (ref 78.0–100.0)
Platelets: 268 10*3/uL (ref 150–400)
RDW: 17.4 % — ABNORMAL HIGH (ref 11.5–15.5)

## 2010-09-09 LAB — MAGNESIUM: Magnesium: 1.7 mg/dL (ref 1.5–2.5)

## 2010-09-10 LAB — BASIC METABOLIC PANEL
BUN: 31 mg/dL — ABNORMAL HIGH (ref 6–23)
Calcium: 8.2 mg/dL — ABNORMAL LOW (ref 8.4–10.5)
Creatinine, Ser: 1.5 mg/dL — ABNORMAL HIGH (ref 0.4–1.2)
GFR calc non Af Amer: 33 mL/min — ABNORMAL LOW (ref >60)
Glucose, Bld: 106 mg/dL — ABNORMAL HIGH (ref 70–99)
Sodium: 137 mEq/L (ref 135–145)

## 2010-09-10 LAB — CBC
MCH: 30.1 pg (ref 26.0–34.0)
Platelets: 292 10*3/uL (ref 150–400)
RBC: 3.26 MIL/uL — ABNORMAL LOW (ref 3.87–5.11)
RDW: 16.6 % — ABNORMAL HIGH (ref 11.5–15.5)

## 2010-09-10 LAB — PREPARE FRESH FROZEN PLASMA: Unit division: 0

## 2010-09-10 LAB — PROTIME-INR: Prothrombin Time: 20.5 seconds — ABNORMAL HIGH (ref 11.6–15.2)

## 2010-09-10 LAB — CROSSMATCH: Unit division: 0

## 2010-09-11 LAB — CBC
MCH: 29.8 pg (ref 26.0–34.0)
MCHC: 33.5 g/dL (ref 30.0–36.0)
MCV: 89.2 fL (ref 78.0–100.0)
Platelets: 349 10*3/uL (ref 150–400)
RDW: 16.4 % — ABNORMAL HIGH (ref 11.5–15.5)
WBC: 16.4 10*3/uL — ABNORMAL HIGH (ref 4.0–10.5)

## 2010-09-11 LAB — BASIC METABOLIC PANEL
BUN: 25 mg/dL — ABNORMAL HIGH (ref 6–23)
Calcium: 7.9 mg/dL — ABNORMAL LOW (ref 8.4–10.5)
Creatinine, Ser: 1.22 mg/dL — ABNORMAL HIGH (ref 0.4–1.2)
GFR calc non Af Amer: 41 mL/min — ABNORMAL LOW (ref >60)
Glucose, Bld: 117 mg/dL — ABNORMAL HIGH (ref 70–99)

## 2010-09-11 NOTE — Consult Note (Signed)
[name], [name]:  [id number]  MEDICAL RECORD NO.:  [id number]           PATIENT TYPE:  E  LOCATION:  [facility]                         FACILITY:  [facility]  PHYSICIAN:  Mary Sella. Andrey Campanile, MD     DATE OF BIRTH:  1917-12-27  DATE OF CONSULTATION: DATE OF DISCHARGE:                                CONSULTATION   REFERRING ER PHYSICIAN:  Kennon Rounds, MD  HOSPITALIST:  Dr. Orvan Falconer  CARDIOLOGIST:  Thereasa Solo. Little, MD  REASON FOR CONSULTATION:  Abdominal pain.  BRIEF HISTORY:  The patient is a 77 year old African American female who complained of pain which started about 2:00 a.m.  She had nausea, but no vomiting.  She said her last bowel movement was yesterday.  She denied any discomfort prior to that, but her niece who is with her says she has been feeling bad for the last 2 days, although no one is really sure what was bothering her.  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  She was ultimately brought to the emergency room for evaluation.  In the ER, lab shows her white count is 9.6, hemoglobin 9.9, hematocrit 30, platelets are 371,000. Urinalysis was normal.  Troponin was 0.01.  CK was 99, CK-MB is 1.2. Lactic acid is slightly elevated at 2.5.  Lipase is 16.  Sodium is 146, potassium is 4.6, chloride is 102, CO2 is 25, BUN is 25, creatinine is 1.82, total bilirubin 1.3, alk phos is 85.  SGOT and SGPT are 38 and 37 respectively.  Protime was 23 seconds with an INR of 2.09, PTT was 31. Albumin is 3.2.  Lipase is 16.  Flat plate acute abdomen series shows question of mild pulmonary edema.  There was left basilar atelectasis, constipation.  CT shows ascites and abdominal fluid.  There is bladder distention, borderline hydronephrosis.  The loop of the small bowel are dilated.  There is stranding in the mesentery.  Liver density appears to be up and there are some renal stones present.  IV contrast was not given  secondary to her renal function.  She has been admitted by the hospitalist.  We were asked see in consultation.  PAST MEDICAL HISTORY: 1. Hypertension. 2. She has a history of coronary artery disease with history of CABG. 3. Last cardiac catheterization was March 29, 2009 which showed     total occlusion of the LAD, circ, on RCA.  Saphenous vein graft to     the LAD and diagonal was patent.  Saphenous vein graft to OM1 and     OM2 are patent.  The saphenous vein graft PDA was patent.  EF was     40%.  2-D echo September 2010 shows an EF of 50% to 55%, mild     aortic stenosis, trivial AR, mild MR. 4. History of V-tach and tachy-brady syndrome. 5. History of atrial fibrillation with currently on amiodarone and     Coumadin therapy. 6. Permanent transvenous pacemaker in September 2010. 7. History of syncope. 8. Full code.  PAST SURGICAL HISTORY:  She has history of coronary  bypass grafting, nephrolithiasis, tooth extraction July 2003.  She has history of back surgery and screws are visible on the x-rays.  There is a questionable history of an appendectomy, but we saw no scar on evaluation.  FAMILY HISTORY:  Her parents are essentially unknown.  She has one brother who died in the hospital of "strangulation."  One sister in her 37s who is apparently in fairly good health.  SOCIAL HISTORY:  Tobacco:  None.  Alcohol:  None.  Drugs:  None.  She is widowed, lives alone, still working today as noted above.  REVIEW OF SYSTEMS:  CV:  Negative for headache, dizziness, syncope.  No history of stroke or seizure.  PULMONARY:  Positive for dyspnea on exertion.  No orthopnea, some occasional PND.  CARDIAC:  She does have occasional chest pain, the last was about 3 days ago, usually goes away with a single nitro.  GI:  Positive for pain since 2 a.m.  She had a bowel movement yesterday.  Positive for nausea, no vomiting.  No diarrhea or constipation reported.  GU:  Trouble voiding today.  She  has been to void since she took the oral contrast.  LOWER EXTREMITIES:  No edema.  She ambulates well.  SKIN:  No rashes.  FEVER:  None known. PSYCH:  No changes.  CURRENT MEDICATIONS: 1. Lasix 40 mg daily. 2. Lopressor ER 25 mg daily. 3. Zetia 10 mg daily. 4. Coumadin 5 mg daily. 5. Amiodarone 200 mg b.i.d. 6. Sublingual nitro p.r.n. 7. Potassium 20 mEq daily. 8. Iron 325 mg one daily.  ALLERGIES:  PENICILLIN, ASPIRIN, and BEE STING all cause rashes.  PHYSICAL EXAMINATION:  GENERAL:  This is a thin African American female 77 years old in extreme discomfort.  Pain is all in the right lower quadrant and the patient says feels like I have a baby. VITAL SIGNS:  Temperature on admission is 97.9, heart rate is 64, respiratory rate is 22, blood pressure is 168/73, sats 96% on room air at 06:51.  Last vital sign is around 2.  Temperature is 97.2, heart rate was 60, blood pressure is 148/66, respiratory rate is 16, sats of 99% on 2 liters although in the room she was on room air. HEAD:  Normocephalic. EARS:  She is extremely hard-of-hearing.  She has hearing aids in each ear and she still cannot hear [place]. NOSE, THROAT AND MOUTH:  All within normal limits.  Dentition is fair, good repair. NECK:  Trachea is in the midline.  Positive for JVD.  There is no palpable thyromegaly or lymphadenopathy.  No bruits. RESPIRATORY:  Effort is normal. CHEST:  Clear to auscultation.  It hurts her to move at all so I did not set her up for pulmonary exam. CARDIAC:  S1 and S2, positive S3.  There is also a murmur which is loudest at the apex.  Pulses are present both upper extremities. Femorals slightly diminished in the lower extremities. CHEST:  Nontender. ABDOMEN:  Slightly distended, extremely tender especially in the right lower quadrant.  There is no hernia or abscess, but can palpate hard and tender area in the right lower quadrant. GU/RECTAL:  Deferred. LYMPHADENOPATHY:  No palpable axillary  or femoral lymphadenopathy. MUSCULOSKELETAL:  No joint abnormalities noted. SKIN:  No rashes or skin changes. NEUROLOGIC:  She is alert, oriented, and cooperative.  Cranial nerves are intact. PSYCHIATRIC:  The patient is perfectly oriented and cooperative.  She is very hard of hearing.  IMPRESSION: 1. Abdominal pain of uncertain etiology.  2. Rule out obstruction versus ischemia versus infarct. 3. Dehydration with elevated creatinine. 4. Coronary artery disease status post CABG with stable cath in 2010. 5. History of ventricular tachycardia and tachy-brady syndrome. 6. Atrial fibrillation, on Coumadin and amiodarone. 7. Hypertension. 8. History of breast cancer. 9. History of nephrolithiasis. 10.History of back surgery.  PLAN:  The patient was seen and evaluated by Dr. Andrey Campanile.  We will plan NG placement, hydration.  We will repeat her labs.  Hold her Coumadin and repeat her films in the a.m.  Further workup and evaluation as indicated.   Alert 77yo AAF with poor hearing.  distened abdomen with TTP on Rt side. no RT. no peritoneal signs. will plan ng tube decompression, gentle IVF resuscitation overnight given underlying heart failure in setting of high BNP.  if pain worsens, develops an  elevated wbc, or becomes acidotic will require exploratory laparotomy.     Eber Hong, P.A.   ______________________________ Mary Sella. Andrey Campanile, MD    WDJ/MEDQ  D:  08/30/2010  T:  08/30/2010  Job:  [id number]  cc:   Dr. Hassan Buckler B. Little, M.D.  Electronically Signed by Sherrie George P.A. on 09/04/2010 02:49:59 PM Electronically Signed by Gaynelle Adu M.D. on 09/11/2010 08:47:59 AM

## 2010-09-11 NOTE — Op Note (Signed)
NAMEJAILANI, [name]:  [id number]  MEDICAL RECORD NO.:  [id number]           PATIENT TYPE:  I  LOCATION:  2110                         FACILITY:  [facility]  PHYSICIAN:  Mary Sella. Andrey Campanile, MD     DATE OF BIRTH:  May 28, 1918  DATE OF PROCEDURE:  08/31/2010 DATE OF DISCHARGE:                              OPERATIVE REPORT   PREOPERATIVE DIAGNOSES: 1. Partial small-bowel obstruction. 2. Peritonitis.  POSTOPERATIVE DIAGNOSIS:  Ischemic small bowel secondary to closed loop obstruction secondary to internal hernia.  PROCEDURES: 1. Exploratory laparotomy. 2. Small-bowel resection with a primary side-to-side anastomosis.  SURGEON:  Mary Sella. Andrey Campanile, MD  ASSISTANT:  Letha Cape, PA-C  ANESTHESIA:  General.  SPECIMEN:  Mid small bowel which was sent to pathology.  ESTIMATED BLOOD LOSS:  Minimal.  FINDINGS:  The midportion of the patient's small bowel was ischemic due to a closed loop obstruction due to an internal hernia involving the mesocolon to the right abdominal wall.  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  She had some intermittent discomfort for the past several days.  However, it worsened around 2:00 a.m. on August 30, 2010.  She came to the emergency room later that morning.  She did not have a white count.  She was not acidotic but the CT scan did show evidence of a partial small- bowel obstruction.  At that time, we recommended observation with bowel rest with n.p.o. and IV fluids.  There was no wall thickening or pneumatosis on her CT scan.  However, this morning on physical exam, she was found to have peritonitis and her white count had increased to 24,000.  We urgently reversed her anticoagulation and gave her 2 units of FFPs, vitamin K, and brought her down to the operating room.   I discussed the risks and benefits with the patient as well as the patient's granddaughter.  I discussed the risk of possible bleeding, infection, injury to surrounding structures, possible need for bowel resection, possible anastomotic complications such as stricture or leakage, incisional hernia, DVT occurrence/stroke, renal failure, pneumonia, prolonged ventilatory dependence, prolonged hospitalization, and death.  She elected to proceed to the operating room.  DESCRIPTION OF PROCEDURE:  After obtaining informed consent, the patient was taken to the operating room and placed supine on the operating table.  General endotracheal anesthesia was established.  Surgical time- out was performed.  Sequential compression devices had been placed.  She received Cipro and Flagyl prior to skin incision.  Her abdomen was prepped and draped in the usual standard surgical fashion.  A midline incision was made sharply with a #10 blade.  The subcutaneous tissue was divided with electrocautery.  The fascia was incised with electrocautery.  I was able to then get my index finger end-to-side of her abdomen.  There was no signs of pneumoperitoneum.  I then opened up the fascia longitudinally with Bovie electrocautery.  There were few omental attachments to the fascia which were taken down with electrocautery.  The bulk of problem was on the right side of her abdomen in the right lower quadrant.  It appeared that she had mesocolon as well as some omentum which was adhered to her right lateral abdominal wall.  I was able to get my hand underneath this and lift up and confirming that this was just omentum.  I bovied through with electrocautery.  This freed up the midportion of her small bowel. Unfortunately, the portion of small bowel contained in this internal hernia sac was nonviable.  It was a closed Hydrographic surveyor.  There was clear area demarcation between viable bowel and nonviable bowel.   There appeared to be adequate proximal small bowel as well as distal small bowel.  Using a hemostat, I made a window in the mesentery just proximal to the area where the ischemic change was.  We then divided this small bowel which was the jejunum with the GIA linear cutter stapler with the 65-mm blue load.  We then turned our attention to the distal ischemic bowel.  Created another window in the mesentery just distal to the area of ischemic change and divided it with blue load of GIA linear cutter stapler.  We then took down the mesentery in a serial fashion using the LigaSure device.  There was one large mesenteric vessel that I put a clamp across, LigaSured it distally, and then used a 2-0 tie to tie off underneath the [place].  The specimen was then freed.  It was a little bit over a foot in length.  The remaining small bowel was viable.  There was no sign of perforation.  There were some ascites.  The hepatic flexure looked hyperemic.  There were also some peritoneal adhesions from her lateral abdominal wall to the cecum and ascending colon.  I mobilized the cecum and ascending colon to make sure that it was viable.  This was done with both blunt fashion as well as with electrocautery taking down the white line of Toldt.  The cecum and ascending colon were viable. The terminal ileum and the appendix looked normal.  The area of hyperemia was mainly in the hepatic flexure and proximal transverse colon.  Using a Doppler, I confirmed there was excellent biphasic signal in the area of the hepatic flexure and proximal transverse colon.  I then placed the small bowel in a side-to-side fashion ensuring that the mesentery was not twisted.  I used a 3-0 silk suture to align the staple line.  I then placed the 3-0 silk suture several inches distally to serve as a crotch stitch.  I then transected the corner of the staple line with electrocautery.  Then one limb of GIA linear cutter, 65-mm stapler  with a blue load was placed through each of the enterotomies. The stapler was brought together and fired to create a common channel. There was no sign of bleeding along the staple line.  I then closed the enterotomy by placing Allis as across the staple line and fired it TX60 stapler with a blue load across it.  I placed another 3-0 silk as a tension relieving suture at the end of the staple line.  The anastomoses were widely patent.  The staple line appeared viable.  The bowel looked viable as well.  I then reapproximated the mesenteric defect with figure- of-eight 3-0 silk sutures.  The mesenteric defect was closed in its entirety.  I then irrigated the abdomen copiously with 6 L of saline.  I confirmed NG tube placement in  the midbody of the stomach.  I then began closing.  Prior to closing, I did place several pieces of Seprafilm on top of the omentum.  We then reapproximated the fascia with two running #1 looped PDS sutures.  The subcutaneous tissue was irrigated.  The skin was reapproximated with skin staples and Telfa wicks were placed between some of the skin staples.  We then placed dressing and tape.  The patient remained intubated and taken to the ICU in stable condition.  All needle, instrument, and sponge counts were correct x2.  There were no immediate complications.  The patient tolerated the procedure well.     Mary Sella. Andrey Campanile, MD     EMW/MEDQ  D:  08/31/2010  T:  09/01/2010  Job:  [id number]  Electronically Signed by Gaynelle Adu M.D. on 09/11/2010 08:48:14 AM

## 2010-09-12 ENCOUNTER — Inpatient Hospital Stay (HOSPITAL_COMMUNITY): Payer: MEDICARE

## 2010-09-12 LAB — EXPECTORATED SPUTUM ASSESSMENT W GRAM STAIN, RFLX TO RESP C

## 2010-09-12 LAB — CBC
MCHC: 33.1 g/dL (ref 30.0–36.0)
Platelets: 355 10*3/uL (ref 150–400)
RDW: 16.4 % — ABNORMAL HIGH (ref 11.5–15.5)
WBC: 21.5 10*3/uL — ABNORMAL HIGH (ref 4.0–10.5)

## 2010-09-12 LAB — COMPREHENSIVE METABOLIC PANEL
AST: 34 U/L (ref 0–37)
Albumin: 2 g/dL — ABNORMAL LOW (ref 3.5–5.2)
BUN: 18 mg/dL (ref 6–23)
CO2: 26 mEq/L (ref 19–32)
Calcium: 8.1 mg/dL — ABNORMAL LOW (ref 8.4–10.5)
Creatinine, Ser: 1.14 mg/dL (ref 0.4–1.2)
GFR calc Af Amer: 54 mL/min — ABNORMAL LOW (ref >60)
GFR calc non Af Amer: 45 mL/min — ABNORMAL LOW (ref >60)
Total Bilirubin: 0.7 mg/dL (ref 0.3–1.2)

## 2010-09-12 LAB — PROTIME-INR: INR: 2.24 — ABNORMAL HIGH (ref 0.00–1.49)

## 2010-09-13 LAB — CBC
Hemoglobin: 8.6 g/dL — ABNORMAL LOW (ref 12.0–15.0)
MCH: 30 pg (ref 26.0–34.0)
MCHC: 33 g/dL (ref 30.0–36.0)
MCV: 90.9 fL (ref 78.0–100.0)
RBC: 2.87 MIL/uL — ABNORMAL LOW (ref 3.87–5.11)

## 2010-09-13 LAB — BASIC METABOLIC PANEL
BUN: 16 mg/dL (ref 6–23)
CO2: 27 mEq/L (ref 19–32)
Chloride: 103 mEq/L (ref 96–112)
Glucose, Bld: 101 mg/dL — ABNORMAL HIGH (ref 70–99)
Potassium: 3.6 mEq/L (ref 3.5–5.1)
Sodium: 135 mEq/L (ref 135–145)

## 2010-09-13 LAB — PROTIME-INR
INR: 1.84 — ABNORMAL HIGH (ref 0.00–1.49)
Prothrombin Time: 21.4 s — ABNORMAL HIGH (ref 11.6–15.2)

## 2010-09-14 LAB — CBC
HCT: 24.3 % — ABNORMAL LOW (ref 36.0–46.0)
Hemoglobin: 8.1 g/dL — ABNORMAL LOW (ref 12.0–15.0)
MCH: 30.2 pg (ref 26.0–34.0)
MCHC: 33.3 g/dL (ref 30.0–36.0)
MCV: 90.7 fL (ref 78.0–100.0)
RDW: 16.2 % — ABNORMAL HIGH (ref 11.5–15.5)

## 2010-09-14 LAB — PROTIME-INR: INR: 1.6 — ABNORMAL HIGH (ref 0.00–1.49)

## 2010-09-15 LAB — CBC
MCH: 30.2 pg (ref 26.0–34.0)
MCHC: 33.2 g/dL (ref 30.0–36.0)
MCV: 91 fL (ref 78.0–100.0)
Platelets: 386 10*3/uL (ref 150–400)
RBC: 2.88 MIL/uL — ABNORMAL LOW (ref 3.87–5.11)

## 2010-09-15 LAB — URINALYSIS, ROUTINE W REFLEX MICROSCOPIC
Bilirubin Urine: NEGATIVE
Hgb urine dipstick: NEGATIVE
Ketones, ur: NEGATIVE mg/dL
Specific Gravity, Urine: 1.014 (ref 1.005–1.030)
pH: 5.5 (ref 5.0–8.0)

## 2010-09-15 LAB — BASIC METABOLIC PANEL
BUN: 15 mg/dL (ref 6–23)
CO2: 27 mEq/L (ref 19–32)
Calcium: 8.3 mg/dL — ABNORMAL LOW (ref 8.4–10.5)
Glucose, Bld: 95 mg/dL (ref 70–99)
Sodium: 139 mEq/L (ref 135–145)

## 2010-09-16 LAB — CBC
Hemoglobin: 9 g/dL — ABNORMAL LOW (ref 12.0–15.0)
MCH: 29.9 pg (ref 26.0–34.0)
MCV: 90.7 fL (ref 78.0–100.0)
RBC: 3.01 MIL/uL — ABNORMAL LOW (ref 3.87–5.11)

## 2010-09-16 LAB — BASIC METABOLIC PANEL
CO2: 26 mEq/L (ref 19–32)
Chloride: 104 mEq/L (ref 96–112)
Glucose, Bld: 95 mg/dL (ref 70–99)
Potassium: 3.8 mEq/L (ref 3.5–5.1)
Sodium: 139 mEq/L (ref 135–145)

## 2010-09-16 LAB — URINE CULTURE
Colony Count: 75000
Culture  Setup Time: 201202241642

## 2010-09-17 LAB — BASIC METABOLIC PANEL
CO2: 28 mEq/L (ref 19–32)
Calcium: 8.4 mg/dL (ref 8.4–10.5)
Creatinine, Ser: 1.21 mg/dL — ABNORMAL HIGH (ref 0.4–1.2)
GFR calc Af Amer: 51 mL/min — ABNORMAL LOW (ref >60)

## 2010-09-17 LAB — PROTIME-INR
INR: 1.86 — ABNORMAL HIGH (ref 0.00–1.49)
Prothrombin Time: 21.6 seconds — ABNORMAL HIGH (ref 11.6–15.2)

## 2010-09-19 NOTE — Progress Notes (Signed)
[name], [name]:  [id number]  MEDICAL RECORD NO.:  [id number]           PATIENT TYPE:  I  LOCATION:  2110                         FACILITY:  [facility]  PHYSICIAN:  Lonia Blood, M.D.DATE OF BIRTH:  07-03-1918                                PROGRESS NOTE   PRIMARY CARE PHYSICIAN: Charlesetta Shanks.  PRIMARY CARDIOLOGIST: Thereasa Solo. Little, M.D.  CONSULTANTS THIS ADMISSION: 1. Nelda Bucks, MD with Pulmonary Critical Care Medicine 2. Mary Sella. Andrey Campanile, MD with General Surgery.  CHIEF COMPLAINT/REASON FOR ADMISSION: Ms. Lope is a very independent 77 year old female patient who has multiple medical problems.  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, work at a local daycare and continues to drive.  She presented to the ER because of complaints of abdominal pain and distention since 2 o'clock in the morning.  Pain was focally located in the right lower quadrant and awakened her from sleep.  The pain was associated with nausea without vomiting.  Evaluation in the ER demonstrated a white count of 9600, hemoglobin 9.9, neutrophils 83%. Sodium 141, potassium 4.6, glucose 191, BUN 25, creatinine 1.8, total bilirubin 1.3.  Lipase 16.  Lactic acid 2.5.  Normal cardiac isoenzymes. Normal urinalysis.  BNP was elevated at 1023.  A CT without contrast was done that showed partial small-bowel obstruction with small-to-moderate amount of abdominopelvic ascites, borderline right hydronephrosis, possible pulmonary edema, and bibasilar atelectasis.  On exam, Dr. Orvan Falconer found the patient that was afebrile, normotensive with a BP of 148/66, pulse 60, respirations 16, and O2 saturations of 99% on room air.  Physical exam was unremarkable except for abdominal distention with rigidity and tenderness especially on the right, positive for guarding, uncertain if rebounding, bowel sounds were absent.  PAST MEDICAL HISTORY: 1.  Hypertension. 2. Chronic atrial fibrillation with history of tachybrady syndrome     status post pacemaker. 3. History of CAD, prior CABG. 4. Chronic systemic anticoagulation, on Coumadin. 5. Apparent history of ventricular tachycardia. 6. Dyslipidemia. 7. History of renal calculi status post cystoscopy and laser stone     extraction by Dr. Brunilda Payor in the past.  ADMITTING DIAGNOSES: 1. Abdominal pain and nausea, vomiting secondary to partial small-     bowel obstruction. 2. Atrial fibrillation, chronic on systemic Coumadin anticoagulation. 3. Known coronary artery disease and prior CABG, currently     asymptomatic. 4. Mild hydronephrosis seen on CT scan in a patient with prior renal     calculi. 5. Hypertension. 6. Dyslipidemia.  DIAGNOSTICS: 1. Acute abdominal series on August 30, 2010 shows a question of mild     constipation.  Suspect mild pulmonary edema on the chest x-ray and     associated left basilar atelectasis.  There is no small bowel     dilatation seen. 2. CT of the abdomen and pelvis without contrast on August 30, 2010     which showed a symmetrical prominent dilated loops of small bowel     in the right lower quadrant with associated stranding in the     mesentery concerning for developing small bowel obstruction.  The  possibility of bowel ischemia should be considered given the     patient's extensive atherosclerotic disease also seen.  No definite     pneumatosis or free air identified.  There was also found to be a     small-to-moderate amount of abdominal pelvic ascites.  In addition,     the radiologist could not exclude a tiny locule of gas in the right     renal collecting system.  There was also seen mild borderline right     hydronephrosis without definite cause identified.  There was also     high density of the liver parenchyme and question if the patient is     on chronic amiodarone therapy.  Cardiomegaly with probable     pulmonary edema and  bibasilar atelectasis was also seen. 3. Portable chest x-ray on August 31, 2010 shows cardiomegaly with     possible developing pulmonary edema and/or bibasilar infection and     trace pleural effusions. 4. Portable chest x-ray on February 9 postop shows endotracheal tube     in appropriate position, appropriate positioning of right jugular     central venous catheter.  Interval worsening of interstitial and     airspace pulmonary edema and worsening dense atelectasis in the     lower lobe, stable right pleural effusion. 5. Portable chest x-ray on February 10 shows no significant change in     pulmonary edema, pleural effusions and bibasilar atelectasis. 6. Portable chest x-ray on February 11 shows little significant change     compared to the previous day. 7. Portable chest x-ray on September 03, 2010 shows extubation was     slightly worsening asymmetric bibasilar airspace disease,     persistent right pleural effusion. 8. Portable chest x-ray on September 04, 2010 shows stable support     apparatus, cardiomegaly, and pulmonary vascular congestion     unchanged, ongoing bibasilar air space opacities is nonspecific     with small pleural effusions.  LABORATORY DATA: BNP at presentation was 1023, on September 01, 2010 peaked at 2708, and last checked on September 02, 2010 was down to 361.  Lactic acid at presentation was 2.5 and on September 01, 2010 postoperative this was down to 1.8 with a procalcitonin of 13.32 on September 01, 2010.  This has trended downward postoperatively and last check on September 04, 2010 was down to 2.09.  Respiratory culture showed nonpathogenic oropharyngeal type flora.  Blood cultures x2 on February 9 showed no growth.  Most recent labs from today, September 04, 2010, pro-time 22.4, INR 1.95, white count 12,500, hemoglobin 8.4, hematocrit 25.8, platelets 230,000.  Phosphorus 2.4, magnesium 2.0, sodium 149, potassium 3.2, chloride 113, CO2 of 28, glucose 95, BUN  29, creatinine 1.37.  PROCEDURES: Exploratory laparotomy with small bowel resection primary side-to-side anastomosis on August 31, 2010 for Dr. Gaynelle Adu.  HOSPITAL COURSE: 1. Symptomatic small bowel obstruction.  The patient was admitted,     Surgery was consulted, Dr. Andrey Campanile evaluated the patient.  An NG     tube was placed for bowel decompression.  Unfortunately within 24     hours of admission, although radiographic findings showed no     concerning problems such as pneumatosis or wall thickening on     physical exam, she was found to have symptoms consistent with     peritonitis and her white count had increased at 24,000.  This     prompted a trip to the operating room.  Preoperatively because  of     therapeutic INR levels, she was given vitamin K and 2 units of FFP     for reversal of Coumadin.  The patient was found to have a segment     of small bowel that was ischemic from a closed-loop obstruction due     to internal hernia.  She underwent a small bowel resection with     primary side-to-side anastomosis.  At the present time, Surgery     continues to follow the patient.  Her incision is clean, dry, and     intact, although she was not passing flatus, she has normoactive     bowel sounds and Surgery is slowly advancing her diet and beginning     to mobilize the patient.  As of today, her white count is 12,500. 2. Ventilatory dependent respiratory failure postop in the setting of     septic shock.  The patient was intubated in the operating room and     remained intubated in the immediate postop period.  At this point     after transfer to the 2100 unit, Dr. Tyson Alias with Pulmonary     Critical Care Medicine assumed care of this patient.  She was     extubated on February 11 and now she is maintaining saturations of     98% on room air.  She still has persistent bibasilar atelectasis     with small effusions and she is utilizing flutter valve for     pulmonary toileting.   We are also encouraging mobilization as well.     Sputum cultures were negative for any growth and no evidence of     hospital acquired pneumonia has been observed. 3. CHF exacerbation, multifactorial secondary to diastolic dysfunction     and volume overload.  Due to the patient's abdominal surgery and     sepsis, she was aggressively fluid resuscitated with expected fluid     shifting in the immediate postop period.  She was followed very     closely by the Critical Care Medicine team.  CVPs were monitored     and they were as high as 9-10.  The patient was having minimal     urine output and her BNP became markedly elevated at 2700 prompting     initiation of IV Lasix 40 mg q.12 h. to promote diuresis.  She has     begun to diurese, but still appears volume overloaded as evidenced     by persistent anemia and abnormal x-ray findings.  We will continue     this Lasix for now and follow her BMET.  We will also check a BNP     in the morning.  Please note that after initiation of Lasix     therapy, the patient's BNP decreased down to just above 300. 4. Atrial fibrillation chronic with history of tachybrady syndrome     with pacemaker.  The patient was on amiodarone and Coumadin prior     to admission.  Due to presentation with a bowel obstruction, she     was placed on n.p.o. status.  In the immediate postop period, the     patient developed rapid ventricular response prompting initiation     of IV amiodarone.  Now she is tolerating oral intake and has been     transitioned back to her usual amiodarone dose.  Currently, she is     maintaining an accelerated junctional rhythm with intermittent  ventricular pacing and she is normotensive. 5. Hypokalemia.  Due to the Lasix the patient has been receiving as     well as fluid shifting, she has been somewhat hypokalemic.     Potassium has been replaced orally.  Please note that phosphorus is     normal, but we will repeat a renal panel in  the morning to ensure     phosphorus does not shift with a low potassium. 6. Acute renal insufficiency.  The patient presented with an elevated     creatinine of 1.8 with restoration of normotensive state and     aggressive fluid hydration in the setting of an acute bowel     obstruction.  The patient's creatinine has continued to trend     downward and as of today is 1.37.  Peak creatinine was 1.82. 7. Anemia.  In review of the patient's old record, her hemoglobin in     2010 was 12.  She presented with a hemoglobin of 9.9.  At the     present time, feel that current anemia is multifactorial secondary     to some blood loss in surgery as well as a dilutional factors.  We     will continue to monitor her hemoglobin. 8. Coronary artery disease and history of CABG.  The patient is     currently stable.  No apparent complaints of chest pain and cardiac     isoenzymes have been normal this admission.  Her EKG remains stable     as well. 9. Hyperglycemia.  This is multifactorial secondary to stress.     Currently the patient's glucoses are now in the normal range.  We     will discontinue CBG checks today.  DISPOSITION: At the present time, the patient is appropriate to transfer to the surgical floor, prefer 5100 if be available.  PLAN: At this time include to discharge home if possible.  The patient was very independent prior to admission.  Continue to drive.  Continue to volunteer and works part-time at [employer]. Please also note that the patient is very hard of hearing and wears bilateral hearing aids.     Allison L. Rennis Harding, N.P.   ______________________________ Lonia Blood, M.D.    ALE/MEDQ  D:  09/04/2010  T:  09/04/2010  Job:  [id number]  cc:   Charlesetta Shanks  Electronically Signed by Junious Silk N.P. on 09/05/2010 03:17:35 PM Electronically Signed by Jetty Duhamel M.D. on 09/19/2010 06:10:26 PM

## 2010-09-21 NOTE — Progress Notes (Signed)
[name], [name]:  [id number]  MEDICAL RECORD NO.:  [id number]           PATIENT TYPE:  I  LOCATION:  5127                         FACILITY:  [facility]  PHYSICIAN:  Shanequa Whitenight Lavera Guise, M.D.       DATE OF BIRTH:  1917/12/22                                PROGRESS NOTE   PRIMARY CARE PHYSICIAN: Dr. Charlesetta Shanks  CURRENT ACTIVE DIAGNOSES: 1. Small bowel obstruction with ischemic small bowel secondary to a     closed-loop obstruction due to an internal hernia status post     exploratory laparotomy and small bowel resection with primary side-     to-side anastomosis by Dr. Gaynelle Adu on September 01, 2010. 2. Ventilator dependent respiratory failure postoperative in the     setting of septic shock from the small bowel necrosis status post     extubation. 3. Suspected healthcare-acquired pneumonia. 4. Acute on chronic congestive heart failure exacerbation diastolic in     nature. 5. Moderate aortic stenosis. 6. Gastrointestinal bleeding most likely due to bleeding at the     anastomotic site in the setting of high INR. 7. Coronary artery disease status post coronary artery bypass     grafting. 8. Ventricular tachycardia and atrial fibrillation status post     permanent pacemaker placement, September 2010.  9.  Status post     coronary artery bypass grafting in 1983. 9. Acute blood loss anemia status post 3 units packed red blood cells. 10.Hypokalemia, repleted.  PROCEDURES: 1. During the period covered by this progress note; on September 08, 2010, the patient underwent a CT scan of abdomen and pelvis with     findings of worsening bibasilar pulmonary consolidations, left     pleural effusion, improved ascites, result small bowel distention,     contrast as pass through the colon anasarca. 2. Bilateral pleural effusions with bilateral airspace opacity.  CONSULTATIONS: During this admission, [facility] is consulting on this patient.  For  hospital course between February 9 and February 13 referred to the previous dictated progress note.  I have started seeing [name] on September 05, 2010.  She initially was progressing slowly with a mild postoperative ileus.  On September 08, 2010, it was noted that the patient was looking ill with increased work of breathing, increased coughing and producing dark sputum.  Chest x-ray on the day showed increased bilateral infiltrates.  The patient was started on empiric antibiotics with cefepime and vancomycin.  She also underwent an emergent CT scan of her abdomen and pelvis, which did not show any worsening intra-abdominal pathology and namely no intra-abdominal abscess.  The patient was diagnosed with healthcare-acquired pneumonia treated with appropriate antibiotics.  Her ileus eventually resolved and she was advanced to a regular diet currently.  She was also diuresed intermittently for volume overload.  She progressed nicely all the way up to September 11, 2010.  At that time, her white blood cell count was down to 16.4.  Today, at September 12, 2010, the white blood cell count is again rising 21.5.  The patient's chest x-ray did show a  certainly a clear improvement and there was actually a left-sided pleural effusion. Pulmonary consultation will be called on September 13, 2010, to help with this patient.  If she continues to have worsening respiratory symptoms and rising white blood cell count. 1. Acute blood loss anemia.  This is secondary to gastrointestinal     bleed and probably occur to the anastomotic site on September 09, 2010.  On that day, the patient was transferred 2 units packed red     blood cells and she was reversed with the fresh frozen plasma.  Of     note, the patient's INR was steadily rising during this admission     despite the fact that we have been holding the Coumadin.  I     attribute that to the concomitant amiodarone use and lack of proper      nutrition. 2. Severe weakness and deconditioning.  Currently, [name] is being     evaluated for possible skilled nursing home placement. 3. Ischemic cardiomyopathy.  The patient has done remarkably well     during this stressful times without any myocardial infarction being     seen. 4. Acute on chronic systolic and diastolic congestive heart failure.     KatherineMathis's measured BNP on September 06, 2010, was 1018.  She was     diuresed as needed throughout this admission.     Lonia Blood, M.D.     SL/MEDQ  D:  09/12/2010  T:  09/12/2010  Job:  [id number]  Electronically Signed by Lonia Blood M.D. on 09/21/2010 05:34:44 PM

## 2010-09-28 NOTE — Discharge Summary (Signed)
[name], [name]:  [id number]  MEDICAL RECORD NO.:  [id number]           PATIENT TYPE:  I  LOCATION:  5127                         FACILITY:  [facility]  PHYSICIAN:  Brendia Sacks, MD    DATE OF BIRTH:  05/08/1918  DATE OF ADMISSION:  08/30/2010 DATE OF DISCHARGE:  09/18/2010                        DISCHARGE SUMMARY - REFERRING   PRIMARY CARE PHYSICIAN:  Dr. Charlesetta Shanks  CONDITION ON DISCHARGE:  Improved.  The patient is being transferred to a skilled facility for rehab.  DISCHARGE DIAGNOSES: 1. Status post small-bowel obstruction with ischemic small bowel     secondary to a closed loop obstruction due to an internal hernia. 2. Status post exploratory laparotomy and small-bowel resection with     primary side-to-side anastomosis by Dr. Gaynelle Adu September 01, 2010. 3. Ventilatory-dependent respiratory failure postoperatively.  In the     setting of septic shock with small bowel necrosis. 4. Atrial fibrillation with rapid ventricular response, resolved. 5. Acute-on-chronic diastolic congestive heart failure, now     compensated. 6. Healthcare-acquired pneumonia. 7. Gastrointestinal bleeding most likely due to bleeding at the     anastomotic site in the setting of a high INR, resolved.     Hemoglobin stable. 8. History of moderate aortic stenosis and coronary artery disease,     stable. 9. History of ventricular tachycardia and atrial fibrillation status     post permanent pacemaker placement in September 2010. 10.Acute blood loss anemia, status post 3 units of packed red blood     cells.  HISTORY OF PRESENT ILLNESS:  This is a 77 year old woman who presented to the Emergency Room with abdominal pain.  She was seen in consultation with Surgery and was taken urgently to the operating room for exploratory laparotomy.  HOSPITAL COURSE: 1. Small-bowel obstruction with ischemic small bowel.  The patient was     taken to the operating room  where an exploratory laparotomy was     performed for small-bowel resection with primary side-to-side     anastomosis.  In the postoperative setting, the patient remained in     the ICU for a ventilatory-dependent respiratory failure, which     resolved with treatment of septic shock for a small bowel necrosis.     She continued to progress well, is eating and bowels are moving and     she has been cleared from a surgical standpoint for a discharge. 2. Atrial fibrillation with rapid ventricular response.  This was in     the setting of sepsis and ventilatory-dependent respiratory failure     and resolved with appropriate pharmacologic treatment. 3. Acute-on-chronic congestive heart failure, diastolic.  Well-     compensated now.  Continue Lasix. 4. Healthcare-acquired pneumonia, fully treated.  This was treated     with 9 days of vancomycin and 10 days of cefepime.  The patient's     oxygen requirement is normal.  She is ambulating well with physical     therapy.  She is getting guaifenesin for cough in the convalescent. 5. Gastrointestinal bleeding.  The patient did have an episode of  gastrointestinal bleeding, which was felt to be most likely at the     anastomotic site in the setting of a high INR.  This quickly     resolved and the patient has had no further bleeding.  Hemoglobin     has remained stable, status post 3 units of packed red blood cells. 6. History of coronary artery disease.  This has remained stable     during this hospitalization.  CONSULTATIONS: 1. General Surgery. 2. Pulmonary Critical Care Medicine.  PROCEDURES: 1. Exploratory laparotomy with small-bowel resection with a primary     side-to-side anastomosis by Dr. Gaynelle Adu September 01, 2010. 2. Transfusion of packed red blood cells as noted above.  IMAGING STUDIES:  CT of the abdomen and pelvis February 8 prominent and dilated loops of small bowel in the right lower quadrant.  Question of developing  small-bowel obstruction.  Bowel ischemia must be considered. The CT abdomen and pelvis February 17:  Bibasilar pulmonary consolidation.  Improved ascites.  Resolve small-bowel obstruction. Anasarca.  Chest x-ray February 21:  Bilateral pleural effusions.  Bilateral airspace opacities seen.  MICROBIOLOGY: 1. Respiratory culture February 9, non pathogenic oral pharyngeal     flora. 2. Blood cultures February 9, no growth times 5 days. 3. Urine culture February 24, yeast.  PERTINENT LABORATORY STUDIES: 1. CBC on discharge; white blood cell count stable at 11.5, hemoglobin     stable at 9.0, normal platelet count. 2. INR on discharge is 2.64 from today's date February 27, previous     date February 26 was 1.86.  The patient have received 5 mg of     Coumadin yesterday. 3. Basic metabolic panel notable for creatinine 1.21 and stable on     discharge.  PHYSICAL EXAMINATION ON DISCHARGE:  The patient is feeling well.  No complaints at this point.  She did have some pain under her left breast earlier today from coughing, but this has resolved. VITAL SIGNS:  Temperature 98.0, pulse 60, respirations 20, blood pressure 130/58, 74% on room air. GENERAL: Well-developed, well-nourished woman in no acute distress.  She is well appearing. CARDIOVASCULAR:  Regular rate and rhythm, 2/6 systolic murmur.  No rub or gallop. EXTREMITIES:  2+ pedal edema rather. RESPIRATORY:  Clear to auscultation bilaterally.  No wheezes, rales, or rhonchi.  Normal respiratory effort.  DISCHARGE INSTRUCTIONS:  The patient will be discharged [facility] today for skilled rehab.  She may increase her activity slowly, walk with assistance, using a rolling walker.  DIET:  Low-sodium heart-healthy  WOUND CARE:  Per [facility]  FOLLOWUP:  Follow up with [facility].  Call for an appointment in approximately 2 weeks.  Follow up with primary care physician after discharge from skilled  facility.  DISCHARGE MEDICATIONS: 1. Lactinex pack p.o. daily. 2. Amiodarone 100 mg p.o. daily. 3. Lasix 40 mg p.o. b.i.d. 4. Guaifenesin 600 mg p.o. b.i.d. 5. Resource peach liquid p.o. t.i.d. 6. Polyethylene glycol 17 g p.o. daily as needed for constipation. 7. Potassium chloride 20 mEq p.o. daily. 8. Psyllium fiber pack 1 packet daily. 9. Coumadin per pharmacy.  Note:  The above in regard to her INR and     Coumadin dosing.  She continues on chronic warfarin for atrial     fibrillation. 10.Iron 325 mg p.o. daily. 11.Nitroglycerin sublingual 0.4 mg in the tongue every 5 minutes as     needed for chest pain up to 3 doses. 12.Zetia 10 mg p.o. daily. 13.Diflucan 100 mg p.o.  daily x3 days for yeast in the urine.  Discontinue metoprolol.  Things to follow up in the outpatient setting. 1. PT/INR.  Goal INR is 2 to 3 for atrial fibrillation. 2. Chronic diastolic congestive heart failure.  Appears to be stable     at this point.  Continue on amiodarone.  Continue follow her pedal     edema.  Suggest at least intermittent weights.  She is currently on     b.i.d. dosing of Lasix, but suggest repeat basic metabolic panel on     February 29 or March 1 and adjust Lasix as clinically indicated.  Time coordinating discharge 30 minutes.   Brendia Sacks, MD     DG/MEDQ  D:  09/18/2010  T:  09/18/2010  Job:  [id number]  cc:   [facility]  Electronically Signed by Brendia Sacks  on 09/27/2010 09:12:01 PM

## 2010-10-27 LAB — BASIC METABOLIC PANEL
BUN: 10 mg/dL (ref 6–23)
CO2: 22 mEq/L (ref 19–32)
CO2: 23 mEq/L (ref 19–32)
CO2: 25 mEq/L (ref 19–32)
CO2: 25 mEq/L (ref 19–32)
Calcium: 8.8 mg/dL (ref 8.4–10.5)
Calcium: 8.8 mg/dL (ref 8.4–10.5)
Calcium: 9.2 mg/dL (ref 8.4–10.5)
Chloride: 110 mEq/L (ref 96–112)
Chloride: 111 mEq/L (ref 96–112)
Creatinine, Ser: 0.91 mg/dL (ref 0.4–1.2)
GFR calc Af Amer: >60 mL/min (ref >60)
GFR calc Af Amer: >60 mL/min (ref >60)
GFR calc non Af Amer: >60 mL/min (ref >60)
Glucose, Bld: 147 mg/dL — ABNORMAL HIGH (ref 70–99)
Glucose, Bld: 97 mg/dL (ref 70–99)
Glucose, Bld: 99 mg/dL (ref 70–99)
Potassium: 4 mEq/L (ref 3.5–5.1)
Sodium: 140 mEq/L (ref 135–145)
Sodium: 143 mEq/L (ref 135–145)

## 2010-10-27 LAB — PROTIME-INR
INR: 1.6 — ABNORMAL HIGH (ref 0.00–1.49)
INR: 1.7 — ABNORMAL HIGH (ref 0.00–1.49)
Prothrombin Time: 17.4 seconds — ABNORMAL HIGH (ref 11.6–15.2)
Prothrombin Time: 19.4 seconds — ABNORMAL HIGH (ref 11.6–15.2)

## 2010-10-27 LAB — CARDIAC PANEL(CRET KIN+CKTOT+MB+TROPI)
CK, MB: 5.7 ng/mL — ABNORMAL HIGH (ref 0.3–4.0)
CK, MB: 7.7 ng/mL — ABNORMAL HIGH (ref 0.3–4.0)
Relative Index: 5.3 — ABNORMAL HIGH (ref 0.0–2.5)
Total CK: 130 U/L (ref 7–177)
Troponin I: 0.4 ng/mL — ABNORMAL HIGH (ref 0.00–0.06)

## 2010-10-27 LAB — CBC
HCT: 33.3 % — ABNORMAL LOW (ref 36.0–46.0)
Hemoglobin: 11.2 g/dL — ABNORMAL LOW (ref 12.0–15.0)
Hemoglobin: 12.1 g/dL (ref 12.0–15.0)
MCHC: 32.8 g/dL (ref 30.0–36.0)
MCHC: 33 g/dL (ref 30.0–36.0)
MCHC: 33.8 g/dL (ref 30.0–36.0)
MCHC: 34.9 g/dL (ref 30.0–36.0)
MCV: 96 fL (ref 78.0–100.0)
Platelets: 141 10*3/uL — ABNORMAL LOW (ref 150–400)
RBC: 3.47 MIL/uL — ABNORMAL LOW (ref 3.87–5.11)
RBC: 3.72 MIL/uL — ABNORMAL LOW (ref 3.87–5.11)
RDW: 16.3 % — ABNORMAL HIGH (ref 11.5–15.5)
RDW: 16.4 % — ABNORMAL HIGH (ref 11.5–15.5)

## 2010-10-27 LAB — POCT CARDIAC MARKERS
CKMB, poc: 1.5 ng/mL (ref 1.0–8.0)
Troponin i, poc: <0.05 ng/mL (ref 0.00–0.09)

## 2010-10-27 LAB — POCT I-STAT 3, VENOUS BLOOD GAS (G3P V): pH, Ven: 7.381 — ABNORMAL HIGH (ref 7.250–7.300)

## 2010-10-27 LAB — DIFFERENTIAL
Basophils Absolute: 0 10*3/uL (ref 0.0–0.1)
Basophils Relative: 0 % (ref 0–1)
Eosinophils Absolute: 0.1 10*3/uL (ref 0.0–0.7)
Eosinophils Relative: 1 % (ref 0–5)
Monocytes Absolute: 0.3 10*3/uL (ref 0.1–1.0)
Neutro Abs: 6.4 10*3/uL (ref 1.7–7.7)

## 2010-10-27 LAB — URINE CULTURE

## 2010-10-27 LAB — APTT
aPTT: 133 seconds — ABNORMAL HIGH (ref 24–37)
aPTT: 31 seconds (ref 24–37)

## 2010-10-27 LAB — GLUCOSE, CAPILLARY: Glucose-Capillary: 128 mg/dL — ABNORMAL HIGH (ref 70–99)

## 2010-10-27 LAB — URINE MICROSCOPIC-ADD ON

## 2010-10-27 LAB — URINALYSIS, ROUTINE W REFLEX MICROSCOPIC
Bilirubin Urine: NEGATIVE
Hgb urine dipstick: NEGATIVE
Protein, ur: NEGATIVE mg/dL
Specific Gravity, Urine: 1.014 (ref 1.005–1.030)
Urobilinogen, UA: 0.2 mg/dL (ref 0.0–1.0)
pH: 7 (ref 5.0–8.0)

## 2010-10-27 LAB — LIPID PANEL
Cholesterol: 115 mg/dL (ref 0–200)
HDL: 48 mg/dL (ref >39)
Total CHOL/HDL Ratio: 2.1 RATIO
Total CHOL/HDL Ratio: 2.2 RATIO
Triglycerides: 59 mg/dL (ref <150)
VLDL: 11 mg/dL (ref 0–40)
VLDL: 12 mg/dL (ref 0–40)

## 2010-10-27 LAB — POCT I-STAT 3, ART BLOOD GAS (G3+)
Acid-base deficit: 1 mmol/L (ref 0.0–2.0)
pCO2 arterial: 34 mmHg — ABNORMAL LOW (ref 35.0–45.0)
pO2, Arterial: 60 mmHg — ABNORMAL LOW (ref 80.0–100.0)

## 2010-10-27 LAB — TSH: TSH: 1.635 u[IU]/mL (ref 0.350–4.500)

## 2010-11-24 ENCOUNTER — Other Ambulatory Visit: Payer: Self-pay | Admitting: Internal Medicine

## 2010-12-08 NOTE — Op Note (Signed)
NAMEDENNIS, KILLILEA NO.:  [id number]   MEDICAL RECORD NO.:  [id number]          PATIENT TYPE:  AMB   LOCATION:  DSC                          FACILITY:  [facility]   PHYSICIAN:  Leonie Man, M.D.   DATE OF BIRTH:  08/20/1917   DATE OF PROCEDURE:  05/22/2004  DATE OF DISCHARGE:                                 OPERATIVE REPORT   PREOPERATIVE DIAGNOSIS:  Invasive carcinoma of the right breast.   POSTOPERATIVE DIAGNOSIS:  Invasive carcinoma of the right breast.   PROCEDURE:  Right breast lumpectomy, following needle localization.   SURGEON:  Leonie Man, M.D.   ASSISTANT:  Nurse.   ANESTHESIA:  General.   INDICATIONS FOR PROCEDURE:  The patient is an 77 year old lady presenting  with an abnormal mammogram which on core biopsy shows a invasive carcinoma  in the upper outer quadrant of the right breast.  She comes to the operating  room now after the risks and benefits of surgery have been fully discussed,  as well as the alternatives to a lumpectomy.  We did not plan a sentinel  lymph node biopsy on this occasion, because of the patient's age,  and the  unlikely circumstance that she would require chemotherapy.  She understands  these risks and benefits and alternatives, and comes to the operating room  having given a consent.   DESCRIPTION OF PROCEDURE:  Following the induction of satisfactory general  anesthesia with the patient positioned supinely, the right breast is prepped  and draped, to be included in a sterile operative field.  An incision is  carried down around the localizing needle, deepening this through the skin,  the subcutaneous tissues, and following the needle down, taking a wide cone  of tissue around the needle, carrying this down to the chest wall.  The  entire specimen was removed, and forwarded for specimen analysis by specimen  mammography.  Specimen mammography shows the lesion to be well contained  within the specimen.  The specimen  was then forwarded for pathologic  evaluation.  The pathology is pending.  The breast cavity is inspected for hemostasis and additional bleeding points  treated with electrocautery.  The sponge, instrument and sharp counts are  verified.  The subcutaneous tissue is closed with interrupted #3-0 Vicryl  sutures.  The skin was closed with a running #5-0 Monocryl and then  reinforced with Steri-Strips.  Sterile dressings applied.  The anesthetic  was reversed.  The patient was removed from the operating room to the recovery room in  stable condition.  She tolerated the procedure well.      Patr  PB/MEDQ  D:  05/22/2004  T:  05/22/2004  Job:  [id number]

## 2010-12-08 NOTE — Cardiovascular Report (Signed)
Katherine, [name]:  [id number]   MEDICAL RECORD NO.:  [id number]          PATIENT TYPE:  OIB   LOCATION:  2853                         FACILITY:  [facility]   PHYSICIAN:  Madaline Savage, M.D.DATE OF BIRTH:  [birth year]   DATE OF PROCEDURE:  11/28/2006  DATE OF DISCHARGE:                            CARDIAC CATHETERIZATION   PROCEDURES PERFORMED:  1. Selective coronary angiography by Judkins technique.  2. Retrograde left heart catheterization.  3. Left ventricular angiography.  4. Selective visualization of multiple saphenous vein grafts.  5. Right heart catheterization with thermodilution cardiac output      determinations.   ENTRY SITE:  Right femoral artery and vein.   DYE USED:  Omnipaque.   COMPLICATIONS:  None.   PATIENT PROFILE:  The patient is an 77 year old single African-American  female who is very active, mentally alert and very gregarious woman who  has been recently shown to have atrial fibrillation which is a new  finding.  It was detected by Dr. Concepcion Mathis and the patient was started on  Coumadin.  The patient saw [place] sometime later, and when we realized she  was in atrial fibrillation, we realized that we needed to do cardiac  coronary angiogram to see if there had been any problems with her 74-  year-old saphenous vein grafts which were placed in 1983 by Dr. Particia Mathis.  She had three vein grafts, all three of which were Y-grafts to  the recipient vessels.  The patient has had recent stress testing which  has shown mild ischemia to the RCA territory, which is an unchanged  finding, an EF of 55% when she was in sinus rhythm and there was noted  to be some moderate dilatation in her left atrium.  The aortic valve was  sclerotic without stenosis.  The patient's medications prior to cardiac  catheterization was Zetia 10 mg a day, tamoxifen 20 mg a day, Sular 10  mg a day, calcium 1200 mg a day, simvastatin 40 mg a day and Coumadin 5  mg  a day.  Today's cardiac catheterization was performed without  complications.   RESULTS:  Pressures:  Right atrial mean pressure 8, right ventricular  pressure 47/5.  Pulmonary artery pressure 45/16.  Pulmonary capillary  wedge pressure 15.  Thermodilution cardiac output 3.7.  Thermodilution  cardiac index 2.2.  Fick cardiac output 3.1.  Fick cardiac index 1.8.  Left ventricular ejection fraction 55% with inferior basal severe  hypokinesis compatible with old infarction.  The patient had trivial  mitral regurgitation.   Coronary arteriography showed the following findings:  1. 100% occlusion of the LAD just after the takeoff from the left main      coronary artery.  2. 100% occlusion of the circumflex almost flush with the left main.  3. 100% occlusion of the RCA about 5-7 mm away from the origin with      some attempts at homocollateralization but no flow beyond the      midportion of the vessel.  4. Saphenous vein graft to left circumflex patent with filling of two  obtuse marginal branches and then a distal circumflex which      collateralized the distal right coronary artery.  5. A Y-graft to the LAD and diagonal with scattered disease throughout      but overall looking good.  6. Saphenous vein graft to RCA occluded.  It should be noted that the      RCA achieves collateralization through the distal LAD which is fed      by patent vein graft and also through the distal circumflex.   LV ejection fraction was 55% with inferobasal hypokinesis.   PLAN:  The patient will be treated medically for her coronary disease.  She is asymptomatic.  The atrial fib is new and that will be dealt with.  She is a candidate for discharge in a few hours.           ______________________________  Madaline Savage, M.D.     WHG/MEDQ  D:  11/28/2006  T:  11/28/2006  Job:  [id number]   cc:   Fleet Contras, M.D.  Madaline Savage, M.D.

## 2010-12-08 NOTE — Op Note (Signed)
TNAMESORCHA, ROTUNNO                       ACCOUNT NO.:  [id number]   MEDICAL RECORD NO.:  [id number]                   PATIENT TYPE:  AMB   LOCATION:  DAY                                  FACILITY:  [facility]   PHYSICIAN:  Lindaann Slough, M.D.               DATE OF BIRTH:  Apr 09, 1918   DATE OF PROCEDURE:  02/19/2002  DATE OF DISCHARGE:                                 OPERATIVE REPORT   PREOPERATIVE DIAGNOSIS:  Right ureteral stone.   POSTOPERATIVE DIAGNOSIS:  Right ureteral stone.   PROCEDURES:  1. Cystoscopy.  2. Right retrograde pyelogram.  3. Ureteroscopy.  4. Holmium laser of ureteral stone with stone extraction.  5. Insertion of double-J cathter.   SURGEON:  Lindaann Slough, M.D.   ASSISTANT:  Crecencio Mc, M.D.   ANESTHESIA:  General.   INDICATIONS:  The patient is 77 year old female who has a history of kidney  stones.  She has been complaining of right flank and right lower quadrant  pain for the past five days.  The pain was severe on February 18, 2002, and a CT  scan of the abdomen and pelvis showed an 8 mm stone in the right upper  ureter with hydronephrosis.  She is scheduled today for cystoscopy and stone  extraction.   DESCRIPTION OF PROCEDURE:  Under general anesthesia, the patient was prepped  and draped and placed in the dorsolithotomy position.  A #22 Wappler  cystoscope was inserted in the bladder.  The bladder mucosa was normal.  There was no stone or tumor in the bladder.  The ureteral orifices were in  normal position and shape with clear efflux.  A cone-tipped catheter was  passed through the cystoscope in the right ureteral orifice.  Contrast was  then injected through the ureteral catheter.  The distal ureter appeared  normal.  There was moderate dilation of the upper ureter without definite  evidence of a filling defect in the ureter.  There was pink of the upper  ureter in the UPJ.  The cone-tipped catheter was then removed.  A guide wire  was  then passed through the cystoscope into the ureter, but the guide wire  could not be passed beyond the upper ureteral kink.  The guide wire was then  removed.  A glide wire was then passed through the cystoscope into the  ureter and advanced into the renal pelvis.  An open-ended catheter was  passed over the glide wire and the glide wire was replaced with a guide  wire.  The cystoscope was then removed.  A 6.5 French rigid ureteroscope was  passed in the bladder and into the ureter.  There was a stone at the  junction of the mid and upper ureter.  A stone basket was passed up through  the ureteroscope, but the stone could not be extracted.  The stone basket  was then removed.  With a holmium laser, the stone  was fragmented in  multiple small fragments and then the stone fragments were extracted with  the stone basket.  The ureteroscope was then advanced up into the upper  ureter and there was no evidence of other stones in the ureter.  The  ureteroscope was then removed.  The guide wire was then backloaded into the  cystoscope and a #6-26 [place] double-J catheter was  passed over the guide wire.  Proximal coil of the double-J catheter was in  the renal pelvis.  The distal end was in the bladder.  The bladder was then  emptied and the cystoscope and guide wire were removed.   The patient tolerated the procedure well and left the OR in satisfactory  condition to the postanesthesia care unit.                                               Lindaann Slough, M.D.    MN/MEDQ  D:  02/19/2002  T:  02/24/2002  Job:  [id number]   cc:   Thelma Barge P. Modesto Charon, M.D.

## 2010-12-08 NOTE — Op Note (Signed)
NAMEJUDI, JAFFE NO.:  [id number]   MEDICAL RECORD NO.:  [id number]          PATIENT TYPE:  INP   LOCATION:  3101                         FACILITY:  [facility]   PHYSICIAN:  Cristi Loron, M.D.DATE OF BIRTH:  Nov 22, 1917   DATE OF PROCEDURE:  01/21/2006  DATE OF DISCHARGE:                                 OPERATIVE REPORT   BRIEF HISTORY:  The patient is an 77 year old black female who suffered from  neurogenic claudication.  She failed medical management and worked up with a  lumbar MRI which demonstrated severe spinal stenosis at L4-5 and L5-S1 with  spondylolisthesis at both these levels.  I discussed the various treatment  with the patient including surgery.  The patient has weighed the risks,  benefits and alternatives to surgery and will proceed with a L4-5 and L5-S1  decompression and fusion.   PREOPERATIVE DIAGNOSIS:  L4-5 and L5-S1 grade 1 acquired spondylolisthesis;  spinal stenosis; lumbago, lumbar radiculopathy.   POSTOPERATIVE DIAGNOSIS:  L4-5 and L5-S1 grade 1 acquired spondylolisthesis;  spinal stenosis; lumbago, lumbar radiculopathy.   PROCEDURE:  L4 and L5 Gill procedure; L4-5 and L5-S1 posterior segmental  instrumentation with Legacy titanium pedicle screws and rods; L4-5, L5-S1  posterolateral arthrodesis with local morselized autograft bone and VITOSS  bone graft extender.  Bilateral L3 laminotomies.   SURGEON:  Dr. Delma Officer   ASSISTANT:  Dr. Maeola Harman   ANESTHESIA:  General endotracheal.   ESTIMATED BLOOD LOSS:  250 mL.   SPECIMENS:  None.   DRAINS:  None.   COMPLICATIONS:  None.   PROCEDURE:  The patient is brought to operating room by anesthesia team.  General endotracheal anesthesia was induced.  The patient was turned prone  position on the Wilson frame.  Lumbosacral region was then prepared with  Betadine scrub and Betadine solution.  Sterile drapes were applied and I  then injected area to be incised with  Marcaine with epinephrine solution and  I used a scalpel to make a linear midline incision over the L4-5 and L5-S1  interspace. Used electrocautery to perform bilateral subperiosteal  dissection exposing spinous process and lamina of L3-4-5 the upper sacrum.  I obtained intraoperative radiograph to confirm our location and then used  the scalpel to incise the L3-4 and 4-5 5-1 interspinous ligament. I used  Leksell rongeur to remove the spinous process and part of the lamina at L4-  L5.  We saved this bone and later cleared it of soft tissue, morselized it,  using it in the fusion process as autograft bone.  I then used a high-speed  drill to perform bilateral L4 and L5 laminotomies.  I completed the  laminectomies at L4 and L5 using the Kerrison punch, completing and then  removed the ligament flavum at L5-S1, L4-5, L3-4.  I then performed generous  foraminotomy about the bilateral L5-S1 nerve roots removing the medial  aspect of the L4-5 L5-S1 facette joints in order to decompress the bilateral  L4 nerve roots.  I performed bilateral L3 laminotomies.  I  performed  foraminotomy about the bilateral L4 nerve roots and then  this completed  decompression.  We inspected the L3-4, 4-5, 5-1 intervertebral disks and  noted there was bulging but there is no herniations and we had good  decompression at this point.   We now turned attention to the posterior segmental instrumentation. Under  fluoroscopic guidance.  I cannulated bilateral 4, 5, S1 pedicles with the  bone probe and tapped pedicles with 5.5 mm tap, then probed inside the  tapped pedicles around cortical breeches and then inserted 6.5 x 55 mm  pedicle screws bilaterally L4-L5 and 6.5 x 50 mm screws bilaterally at S1.  Under fluoroscopic guidance.  Then palpated along the medial aspect of  bilateral 4, 5, S1 pedicles and noted was no cortical breeches and the nerve  roots were not injured.  I connected unilateral screws with a lordotic  rod  which we held in place with the caps and then placed cross connector. We  tightened all the connections appropriately completing the instrumentation.   We now turned attention to posterolateral arthrodesis.  I used high-speed  drill to decorticate the remainder of the L4-5, L5-S1 facet joints to pars  regions and decorticate the upper sacrum and then laid a combination of  VITOSS and local autograft bone over these decorticated structures as well  as bone morphogenic protein over these decorticated structures, completing  posterolateral arthrodesis.   I then inspected the thecal sac and bilateral L4-5 and S1 nerve roots and  noted they were well decompressed.  We obtained hemostasis using bipolar  cautery.  I then removed the [facility] retractor which had in place for  exposure and then reapproximated the patient's thoracolumbar fascia with  interrupted #1 Vicryl suture, subcutaneous tissue with 2-0 Vicryl suture and  skin with Steri-Strips and Benzoin.  The wound was then coated with  bacitracin ointment.  A sterile dressing applied.  The drapes were removed  and the patient was subsequently returned to supine position where she was  extubated by anesthesia team and transported to post anesthesia care unit in  stable condition.  All sponge, instrument and needle counts were correct at  the end of this case.      Cristi Loron, M.D.  Electronically Signed     JDJ/MEDQ  D:  01/21/2006  T:  01/21/2006  Job:  [id number]

## 2010-12-08 NOTE — Discharge Summary (Signed)
NAMEMARYLENE, MASEK NO.:  [id number]   MEDICAL RECORD NO.:  [id number]          PATIENT TYPE:  INP   LOCATION:  3004                         FACILITY:  [facility]   PHYSICIAN:  Danae Orleans. Venetia Maxon, M.D.  DATE OF BIRTH:  1948-03-21   DATE OF ADMISSION:  01/21/2006  DATE OF DISCHARGE:  01/26/2006                                 DISCHARGE SUMMARY   REASON FOR ADMISSION:  1.  Lumbar spondylolisthesis  2.  Spinal stenosis.  3.  Lumbago  4.  Lumbar radiculopathy.   DISCHARGE DIAGNOSES:  1.  Lumbar spondylolisthesis  2.  Spinal stenosis.  3.  Lumbago  4.  Lumbar radiculopathy.   HISTORY OF PRESENT ILLNESS:  [name] is an 77 year old woman with  neurogenic claudication who has failed medical management and had an MRI  scan which demonstrates severe spinal stenosis at L4-L5 and L5-S1 with  spondylolisthesis at both of these levels.   HOSPITAL COURSE:  It was elected to admit her to the hospital; and she  underwent decompression and fusion to these affected levels.  She had  significant relief of her pain, was gradually mobilized and did well  postoperatively; was taking Tylenol for pain; and was cleared for discharge  home by physical therapy.  She was discharged home on the 7th in stable and  satisfactory condition, having tolerating her operation and hospitalization  well.   DISCHARGE INSTRUCTIONS:  No lifting, bending, twisting or driving.  Follow  up with Dr. Lovell Sheehan in three weeks in the office.   MEDICATIONS ON DISCHARGE:  She was discharged home on her regular  medications which include Lipitor 80 mg p.o. at night; Zetia 10 mg at night;  calcium 1200 mg daily; Sular 10 mg in the morning; tamoxifen 20 mg at night;  and Tylenol as needed for pain.      Danae Orleans. Venetia Maxon, M.D.  Electronically Signed     JDS/MEDQ  D:  01/26/2006  T:  01/26/2006  Job:  [id number]

## 2010-12-08 NOTE — Cardiovascular Report (Signed)
NAMEMAHUM, BETTEN NO.:  [id number]   MEDICAL RECORD NO.:  [id number]          PATIENT TYPE:  OIB   LOCATION:  2853                         FACILITY:  [facility]   PHYSICIAN:  Madaline Savage, M.D.DATE OF BIRTH:  [birth year]   DATE OF PROCEDURE:  11/28/2006  DATE OF DISCHARGE:                            CARDIAC CATHETERIZATION   PROCEDURES PERFORMED:  1. Selective coronary angiography by Judkins technique.  2. Retrograde left heart catheterization.  3. Left ventricular angiography.  4. Selective visualization of multiple saphenous vein grafts.  5. Right heart catheterization with cardiac output determinations.   COMPLICATIONS:  None.   ENTRY SITE:  Right femoral.   DYE USED:  Omnipaque.   PATIENT PROFILE:  Ms. Tilmon is an 77 year old single African-American  female who is a physically active, robust and energetic patient who I am  fortunate enough to know as her doctor as well as a   Dictation ended at this point.           ______________________________  Madaline Savage, M.D.     WHG/MEDQ  D:  11/28/2006  T:  11/28/2006  Job:  [id number]

## 2010-12-08 NOTE — Op Note (Signed)
NAMELORANDA, MASTEL NO.:  [id number]   MEDICAL RECORD NO.:  [id number]          PATIENT TYPE:  AMB   LOCATION:  DAY                          [facility]ACILITY[facility]   PHYSICIAN:  Leonie Man, M.D.   DATE OF BIRTH:  1918-05-13   DATE OF PROCEDURE:  06/09/2004  DATE OF DISCHARGE:                                 OPERATIVE REPORT   PREOPERATIVE DIAGNOSIS:  Carcinoma of the right breast, status post  lumpectomy with close anterior margins.   POSTOPERATIVE DIAGNOSIS:  Carcinoma of the right breast, status post  lumpectomy with close anterior margins.   PROCEDURE:  Re-excision of right breast biopsy cavity for margins.   SURGEON:  Leonie Man, M.D.   ASSISTANT:  Nurse.   ANESTHESIA:  General.   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  She underwent  needle localization and lumpectomy of the right breast.  On final pathology  report, she is noted to have close margins on the anterior portion of the  specimen.  She returns to the operating room now for re-excision of the  cavity for margins, particularly for anterior margins.  She understands the  risks and potential benefits of surgery, and she gives consent.   PROCEDURE:  Following the induction of satisfactory general anesthesia with  the patient positioned supinely, the area of the right breast is prepped and  draped, to be included in the sterile operative field.  An elliptical  incision is carried down over the previous scar cicatrix, leading through  the skin down to the subcutaneous tissues.  Superomedial, lateral, and  inferior flaps are raised, to excise the entire previous cavity.  The cavity  is excised all the way down to the pectoralis muscle.  The previous  lumpectomy site included the anterior rectus sheath.  Did not take any  additional posterior margin.  The entire specimen has been removed and  forwarded for pathologic evaluation after a marking suture had been placed  strategically.  The wound was then thoroughly irrigated with multiple  aliquots of normal saline.  Sponge, instrument, and sharp counts were  verified.  Hemostasis was assured with electrocautery, and the wound closed  in layers, as follows:  The subcutaneous tissue was closed with interrupted  3-0 Vicryl sutures, the skin was closed with running 5-0 Monocryl suture and  then reinforced with Steri-Strips.  Sterile dressings were applied to the  wound.  The anesthetic was reversed, and patient was moved from the  operating room to the recovery room in stable condition.  She tolerated the  procedure well.     Patr   PB/MEDQ  D:  06/09/2004  T:  06/09/2004  Job:  [id number]

## 2011-01-08 ENCOUNTER — Other Ambulatory Visit: Payer: Self-pay | Admitting: Family Medicine

## 2011-01-08 ENCOUNTER — Ambulatory Visit
Admission: RE | Admit: 2011-01-08 | Discharge: 2011-01-08 | Disposition: A | Discharge status: Home / Self Care | Payer: Medicare Other | Source: Ambulatory Visit | Attending: Family Medicine | Admitting: Family Medicine

## 2011-01-08 DIAGNOSIS — R609 Edema, unspecified: Secondary | ICD-10-CM

## 2011-01-30 ENCOUNTER — Ambulatory Visit (HOSPITAL_COMMUNITY)
Admission: RE | Admit: 2011-01-30 | Discharge: 2011-01-30 | Disposition: A | Discharge status: Home / Self Care | Payer: Medicare Other | Source: Ambulatory Visit | Attending: Cardiovascular Disease | Admitting: Cardiovascular Disease

## 2011-01-30 DIAGNOSIS — I059 Rheumatic mitral valve disease, unspecified: Secondary | ICD-10-CM | POA: Insufficient documentation

## 2011-04-10 ENCOUNTER — Other Ambulatory Visit: Payer: Self-pay | Admitting: Family Medicine

## 2011-04-10 DIAGNOSIS — Z853 Personal history of malignant neoplasm of breast: Secondary | ICD-10-CM

## 2011-04-10 DIAGNOSIS — Z87898 Personal history of other specified conditions: Secondary | ICD-10-CM

## 2011-04-17 ENCOUNTER — Ambulatory Visit
Admission: RE | Admit: 2011-04-17 | Discharge: 2011-04-17 | Disposition: A | Discharge status: Home / Self Care | Payer: Medicare Other | Source: Ambulatory Visit | Attending: Family Medicine | Admitting: Family Medicine

## 2011-04-17 ENCOUNTER — Other Ambulatory Visit: Payer: Self-pay | Admitting: Family Medicine

## 2011-04-17 DIAGNOSIS — Z853 Personal history of malignant neoplasm of breast: Secondary | ICD-10-CM

## 2011-05-09 ENCOUNTER — Inpatient Hospital Stay (HOSPITAL_COMMUNITY)
Admission: AD | Admit: 2011-05-09 | Discharge: 2011-05-12 | DRG: 293 | Disposition: A | Discharge status: Home Health | Payer: Medicare Other | Source: Ambulatory Visit | Attending: Internal Medicine | Admitting: Internal Medicine

## 2011-05-09 ENCOUNTER — Inpatient Hospital Stay (HOSPITAL_COMMUNITY): Payer: Medicare Other

## 2011-05-09 DIAGNOSIS — I4891 Unspecified atrial fibrillation: Secondary | ICD-10-CM | POA: Diagnosis present

## 2011-05-09 DIAGNOSIS — Z950 Presence of cardiac pacemaker: Secondary | ICD-10-CM

## 2011-05-09 DIAGNOSIS — I495 Sick sinus syndrome: Secondary | ICD-10-CM | POA: Diagnosis present

## 2011-05-09 DIAGNOSIS — I2589 Other forms of chronic ischemic heart disease: Secondary | ICD-10-CM | POA: Diagnosis present

## 2011-05-09 DIAGNOSIS — I2510 Atherosclerotic heart disease of native coronary artery without angina pectoris: Secondary | ICD-10-CM | POA: Diagnosis present

## 2011-05-09 DIAGNOSIS — Z9119 Patient's noncompliance with other medical treatment and regimen: Secondary | ICD-10-CM

## 2011-05-09 DIAGNOSIS — Z886 Allergy status to analgesic agent status: Secondary | ICD-10-CM

## 2011-05-09 DIAGNOSIS — N289 Disorder of kidney and ureter, unspecified: Secondary | ICD-10-CM | POA: Diagnosis not present

## 2011-05-09 DIAGNOSIS — Z79899 Other long term (current) drug therapy: Secondary | ICD-10-CM

## 2011-05-09 DIAGNOSIS — I5023 Acute on chronic systolic (congestive) heart failure: Principal | ICD-10-CM | POA: Diagnosis present

## 2011-05-09 DIAGNOSIS — D638 Anemia in other chronic diseases classified elsewhere: Secondary | ICD-10-CM | POA: Diagnosis present

## 2011-05-09 DIAGNOSIS — Z91199 Patient's noncompliance with other medical treatment and regimen due to unspecified reason: Secondary | ICD-10-CM

## 2011-05-09 DIAGNOSIS — Z91038 Other insect allergy status: Secondary | ICD-10-CM

## 2011-05-09 DIAGNOSIS — Z7901 Long term (current) use of anticoagulants: Secondary | ICD-10-CM

## 2011-05-09 DIAGNOSIS — N183 Chronic kidney disease, stage 3 unspecified: Secondary | ICD-10-CM | POA: Diagnosis present

## 2011-05-09 DIAGNOSIS — Z880 Allergy status to penicillin: Secondary | ICD-10-CM

## 2011-05-09 DIAGNOSIS — Z951 Presence of aortocoronary bypass graft: Secondary | ICD-10-CM

## 2011-05-09 LAB — CBC
HCT: 27.2 % — ABNORMAL LOW (ref 36.0–46.0)
Hemoglobin: 8.9 g/dL — ABNORMAL LOW (ref 12.0–15.0)
MCH: 30.9 pg (ref 26.0–34.0)
MCHC: 32.7 g/dL (ref 30.0–36.0)
MCV: 94.4 fL (ref 78.0–100.0)

## 2011-05-09 LAB — DIFFERENTIAL
Lymphocytes Relative: 19 % (ref 12–46)
Monocytes Absolute: 0.6 10*3/uL (ref 0.1–1.0)
Monocytes Relative: 10 % (ref 3–12)
Neutro Abs: 4.3 10*3/uL (ref 1.7–7.7)

## 2011-05-09 LAB — CARDIAC PANEL(CRET KIN+CKTOT+MB+TROPI)
CK, MB: 2.3 ng/mL (ref 0.3–4.0)
Relative Index: INVALID (ref 0.0–2.5)
Total CK: 96 U/L (ref 7–177)
Total CK: 84 U/L (ref 7–177)
Troponin I: <0.3 ng/mL (ref <0.30)

## 2011-05-09 LAB — COMPREHENSIVE METABOLIC PANEL
Alkaline Phosphatase: 176 U/L — ABNORMAL HIGH (ref 39–117)
BUN: 22 mg/dL (ref 6–23)
Calcium: 9 mg/dL (ref 8.4–10.5)
Creatinine, Ser: 1.17 mg/dL — ABNORMAL HIGH (ref 0.50–1.10)
GFR calc Af Amer: 46 mL/min — ABNORMAL LOW (ref >90)
Glucose, Bld: 92 mg/dL (ref 70–99)
Potassium: 4.5 mEq/L (ref 3.5–5.1)
Total Protein: 6.9 g/dL (ref 6.0–8.3)

## 2011-05-09 LAB — PRO B NATRIURETIC PEPTIDE: Pro B Natriuretic peptide (BNP): 20934 pg/mL — ABNORMAL HIGH (ref 0–450)

## 2011-05-10 LAB — CBC
Platelets: 220 10*3/uL (ref 150–400)
RDW: 17.2 % — ABNORMAL HIGH (ref 11.5–15.5)
WBC: 5.5 10*3/uL (ref 4.0–10.5)

## 2011-05-10 LAB — BASIC METABOLIC PANEL
CO2: 21 mEq/L (ref 19–32)
Calcium: 9.1 mg/dL (ref 8.4–10.5)
Creatinine, Ser: 1.33 mg/dL — ABNORMAL HIGH (ref 0.50–1.10)
GFR calc Af Amer: 39 mL/min — ABNORMAL LOW (ref >90)
GFR calc non Af Amer: 34 mL/min — ABNORMAL LOW (ref >90)

## 2011-05-10 LAB — PROTIME-INR
INR: 2.63 — ABNORMAL HIGH (ref 0.00–1.49)
Prothrombin Time: 28.5 seconds — ABNORMAL HIGH (ref 11.6–15.2)

## 2011-05-10 LAB — PRO B NATRIURETIC PEPTIDE: Pro B Natriuretic peptide (BNP): 20708 pg/mL — ABNORMAL HIGH (ref 0–450)

## 2011-05-11 LAB — CBC
MCV: 93.3 fL (ref 78.0–100.0)
Platelets: 237 10*3/uL (ref 150–400)
RDW: 17 % — ABNORMAL HIGH (ref 11.5–15.5)
WBC: 6.8 10*3/uL (ref 4.0–10.5)

## 2011-05-11 LAB — BASIC METABOLIC PANEL
CO2: 26 mEq/L (ref 19–32)
Calcium: 8.9 mg/dL (ref 8.4–10.5)
Creatinine, Ser: 1.6 mg/dL — ABNORMAL HIGH (ref 0.50–1.10)

## 2011-05-11 LAB — PROTIME-INR: INR: 2.37 — ABNORMAL HIGH (ref 0.00–1.49)

## 2011-05-12 LAB — BASIC METABOLIC PANEL
BUN: 27 mg/dL — ABNORMAL HIGH (ref 6–23)
CO2: 23 mEq/L (ref 19–32)
Chloride: 104 mEq/L (ref 96–112)
Creatinine, Ser: 1.48 mg/dL — ABNORMAL HIGH (ref 0.50–1.10)
Glucose, Bld: 98 mg/dL (ref 70–99)

## 2011-05-12 LAB — CBC
HCT: 24.4 % — ABNORMAL LOW (ref 36.0–46.0)
Hemoglobin: 8 g/dL — ABNORMAL LOW (ref 12.0–15.0)
MCH: 30.8 pg (ref 26.0–34.0)
MCHC: 32.8 g/dL (ref 30.0–36.0)

## 2011-05-12 LAB — POTASSIUM: Potassium: 4.3 mEq/L (ref 3.5–5.1)

## 2011-05-12 NOTE — H&P (Addendum)
[name], [name] NO.:  [id number]  MEDICAL RECORD NO.:  [id number]  LOCATION:  [facility]                       FACILITY:  [facility]  PHYSICIAN:  Katherine Rosalia Mcavoy, MD         DATE OF BIRTH:  Jun 11, 1918  DATE OF ADMISSION:  01/30/2011 DATE OF DISCHARGE:  01/30/2011                             HISTORY & PHYSICAL   CHIEF COMPLAINT:  Shortness of breath.  HISTORY OF PRESENT ILLNESS:  [name] is a 77 year old female, who presented to the [facility] office today with increasing shortness of breath over the past 4-5 days.  She had recently seen Dr. Royann Shivers, in the office a month ago and was doing quite well; however and at that time, he had decreased her metoprolol in the setting near 100% ventricular pacing.  She is known to have an ischemic cardiomyopathy with EF of 35%.  He was on a higher dose diuretics including metolazone 5 mg daily and Lasix 40-80 mg twice daily.  She did admit that over the past few weeks or 2 weeks that she has missed "lost or misplaced" her pills that she takes twice daily, this would be her Lasix and subsequently has presented what appears to be heart failure exacerbation.  It is noted that today she has gained 14 pounds and does have mild cough and short of breath.  In the office, she ambulated with a pulse oximeter and oxygen saturations dropped to 86% and given his symptoms it was felt that she should be referred to the hospital for admission.  PAST MEDICAL HISTORY:  As mentioned include; 1. Hypertension. 2. Coronary artery disease status post CABG. 3. Ischemic cardiomyopathy, EF of 35%. 4. Mild aortic stenosis. 5. History of VT and tachy-brady syndrome status post pacemaker near     100% ventricular pacing. 6. History of atrial fibrillation on Coumadin. 7. History of syncope.  FAMILY HISTORY:  Nonsignificant for coronary disease.  Her brother had died of strangulation apparently and her sister was supposedly in  good health.  SOCIAL HISTORY:  She is a nonsmoker.  Denies alcohol, drugs, and is widowed.  Lives alone and does still drive for herself including today to the appointment.  REVIEW OF SYSTEMS:  As per HPI; otherwise, 10-point review of systems is negative.  ALLERGIES: 1. PENICILLIN. 2. ASPIRIN. 3. BEE STINGS. 4. Intolerances to LISINOPRIL, which causes cough. 5. SIMVASTATIN, which causes muscle aches. 6. AMLODIPINE, which causes rash.  MEDICATIONS: 1. Zetia 10 mg daily. 2. Coumadin adjusted per INR. 3. Tylenol p.r.n. 4. Metoprolol succinate 12.5 mg daily. 5. Nitroglycerin p.r.n. 6. Potassium 20 mEq b.i.d. 7. Lasix 80 mg b.i.d. (not taking). 8. Diovan 40 mg daily. 9. Metolazone 5 mg daily.  PHYSICAL EXAMINATION:  VITAL SIGNS:  Weight is today 124, pulse is 68, heart rate is 132/70, and height 5 feet 7 inches. GENERAL:  Awake.  Appears in some mild respiratory distress. HEENT:  Notable for dry mucous membranes.  Pupils equal, round, and reactive to light and accommodation. NECK:  Supple.  Jugular veins were distended to 13 cm of water. HEART:  Regular rate and rhythm.  Normal S1 and S2.  There is a 2-3/6 holosystolic murmur  heard best at the apex. LUNGS:  Notable for bibasilar crackles greater on the left than the right for some dullness at the left base. ABDOMEN:  Soft and mildly protuberant. EXTREMITIES:  Notable for trace 1+ to 2+ pitting edema, slightly worse on the left than the right.  IMPRESSION: 1. Acute systolic heart failure exacerbation. 2. Ischemic cardiomyopathy, EF 35%. 3. Chronic systolic heart failure with [employer] Heart Association     Class 4 symptoms. 4. History of ventricular tachycardia/atrial fibrillation and     tachybrady syndrome status post permanent pacemaker placement. 5. Hypertension. 6. Dyslipidemia.  PLAN:  Ms. Bosler presents today in mild extremities with shortness of breath and low oxygen saturations with ambulation.  She has had  14 pound weight gain and signs and symptoms of heart failure exacerbation.  I discussed with her that she would benefit most from IV diuretics and reestablishment of her home diuretics, which she has simply forgot to take.  I do not feel that there needs to be any further workup as far as the repeat echocardiogram as this exacerbation is most likely due to the patient's medication noncompliance.  In addition, there could potentially be a need for biventricular pacing in the future.  I would defer this to Dr. Royann Shivers. Plan is to admit her directly to 4700 and understand she will be going to 4714.     Katherine Zuhair Lariccia, MD     CH/MEDQ  D:  05/09/2011  T:  05/09/2011  Job:  [id number]  cc:   Katherine Shanks, MD [name], M.D. [name], M.D. Katherine Fair, MD  Electronically Signed by Kirtland Bouchard. [name] M.D. on 05/23/2011 08:18:39 AM

## 2011-05-15 NOTE — Discharge Summary (Signed)
[name], [name]:  [id number]  MEDICAL RECORD NO.:  [id number]  LOCATION:  4714                         FACILITY:  [facility]  PHYSICIAN:  [place] Nena Hampe, MD         DATE OF BIRTH:  [birth year]  DATE OF ADMISSION:  05/09/2011 DATE OF DISCHARGE:  05/12/2011                              DISCHARGE SUMMARY   DISCHARGE DIAGNOSES: 1. Acute on chronic systolic heart failure secondary to medication     noncompliance. 2. Ischemic cardiomyopathy, EF 30-35% on 2D echo in July 2012. 3. Chronic atrial fibrillation, on anticoagulation with Coumadin. 4. Sick sinus syndrome with history of permanent pacemaker placement. 5. History of ventricular tachycardia. 6. Chronic kidney disease, stage III.     a.     Acute on chronic renal insufficiency secondary to diuresing,      now improved. 7. Chronic anemia.  DISCHARGE CONDITION:  Improved.  Weight prior to discharge was 50.7 kilos.  DISCHARGE MEDICATIONS:  See medication reconciliation sheet.  Metoprolol was changed to Coreg and at discharge, she was on furosemide 80 mg b.i.d. and metolazone as well.  BRIEF DISCHARGE INSTRUCTIONS: 1. Increase activity slowly. 2. Low-sodium heart healthy diet. 3. No more than 2000 mg of sodium daily. 4. Follow up with Dr. Royann Shivers this week, the office will call with     date and time. 5. Have a Coumadin check at your primary care office on Tuesday,     June 15, 2011. 6. Call if weight increases greater than 3 pounds.  HOSPITAL COURSE:  The patient was admitted on May 09, 2011, from the office after she presented with increased dyspnea and oxygen saturations 86% with ambulation in the office.  She has also had a nonproductive cough and a 14-pound weight gain.  She had positive JVD 13 cm water and bibasilar crackles in her lungs as well as 1-2+ pitting edema of lower extremities.  She was admitted for diuresing.  The patient does have a history of  bradycardia and has a  permanent pacemaker of [employer].  She has an ischemic cardiomyopathy with EF 30- 35% from July 2012.  She has had coronary disease with bypass grafting. Her last cath was in 2010 and medical therapy was recommended.  She has a history of ventricular tachycardia with syncope.  She was diuresed during hospital course as stated, was diuresed with improvement of her status.  We continued her Coumadin during hospitalization.  This is an independent 77 year old and she still drives.  At discharge, her breathing was back to baseline.  She is anemic and this is chronic for her. At discharge, blood pressure was 143/70 to 110/60, pulse 60, respirations 14, temp 98.7, oxygen saturation 95%.  Heart S1, S2 with a 2-3/6 systolic murmur.  Lungs with crackles only in the bases.  Abdomen, positive bowel sounds, soft, nontender.  Extremities without edema at all.  She was seen by Dr. Royann Shivers and he felt she was stable and ready for discharge home.  He felt the RV pacing appears to worsen her EF secondary to mitral regurg and heart failure.  He also felt anemia was a contributing factor to her heart failure exacerbation, but  hemoglobin 7.6 to 9.8, is where she stays most of the year on hemoglobin level.  Please note B12 level was done prior to discharge and those results are not back yet.  LABS AT DISCHARGE:  Sodium 138, potassium 4.2, BUN 27, creatinine 1.48, glucose 98, ProTime 26, INR 2.34, magnesium 2.1.  Hemoglobin 8, hematocrit 24.4, platelets 237, WBC 5.3.  Chest x-ray on admission cardiomegaly, COPD with chronic changes.  No definite acute process. The patient will follow up as instructed.     Darcella Gasman. Annie Paras, N.P.   ______________________________ [place] Trayvion Embleton, MD    LRI/MEDQ  D:  05/13/2011  T:  05/14/2011  Job:  [id number]  cc:   Thurmon Fair, MD  Electronically Signed by Nada Boozer N.P. on 05/14/2011 01:34:43 PM Electronically Signed by Kirtland Bouchard. Beniah Magnan M.D. on 05/15/2011 08:23:09 AM

## 2011-05-18 ENCOUNTER — Ambulatory Visit
Admission: RE | Admit: 2011-05-18 | Discharge: 2011-05-18 | Disposition: A | Discharge status: Home / Self Care | Payer: Medicare Other | Source: Ambulatory Visit | Attending: Family Medicine | Admitting: Family Medicine

## 2011-05-18 DIAGNOSIS — Z853 Personal history of malignant neoplasm of breast: Secondary | ICD-10-CM

## 2011-06-28 ENCOUNTER — Ambulatory Visit
Admission: RE | Admit: 2011-06-28 | Discharge: 2011-06-28 | Disposition: A | Discharge status: Home / Self Care | Payer: Medicare Other | Source: Ambulatory Visit | Attending: Family Medicine | Admitting: Family Medicine

## 2011-06-28 ENCOUNTER — Other Ambulatory Visit: Payer: Self-pay | Admitting: Family Medicine

## 2011-06-28 DIAGNOSIS — R05 Cough: Secondary | ICD-10-CM

## 2011-07-02 ENCOUNTER — Encounter: Payer: Self-pay | Admitting: *Deleted

## 2011-07-02 ENCOUNTER — Emergency Department (HOSPITAL_COMMUNITY): Payer: Medicare Other

## 2011-07-02 ENCOUNTER — Inpatient Hospital Stay (HOSPITAL_COMMUNITY)
Admission: EM | Admit: 2011-07-02 | Discharge: 2011-07-04 | DRG: 683 | Disposition: A | Discharge status: Home / Self Care | Payer: Medicare Other | Source: Ambulatory Visit | Attending: Internal Medicine | Admitting: Internal Medicine

## 2011-07-02 ENCOUNTER — Other Ambulatory Visit: Payer: Self-pay

## 2011-07-02 DIAGNOSIS — I495 Sick sinus syndrome: Secondary | ICD-10-CM | POA: Insufficient documentation

## 2011-07-02 DIAGNOSIS — I4891 Unspecified atrial fibrillation: Secondary | ICD-10-CM | POA: Diagnosis present

## 2011-07-02 DIAGNOSIS — Z7901 Long term (current) use of anticoagulants: Secondary | ICD-10-CM

## 2011-07-02 DIAGNOSIS — I5032 Chronic diastolic (congestive) heart failure: Secondary | ICD-10-CM | POA: Diagnosis present

## 2011-07-02 DIAGNOSIS — I509 Heart failure, unspecified: Secondary | ICD-10-CM | POA: Diagnosis present

## 2011-07-02 DIAGNOSIS — N289 Disorder of kidney and ureter, unspecified: Secondary | ICD-10-CM

## 2011-07-02 DIAGNOSIS — N179 Acute kidney failure, unspecified: Principal | ICD-10-CM | POA: Diagnosis present

## 2011-07-02 DIAGNOSIS — J069 Acute upper respiratory infection, unspecified: Secondary | ICD-10-CM

## 2011-07-02 DIAGNOSIS — I2510 Atherosclerotic heart disease of native coronary artery without angina pectoris: Secondary | ICD-10-CM | POA: Insufficient documentation

## 2011-07-02 HISTORY — DX: Cardiac murmur, unspecified: R01.1

## 2011-07-02 HISTORY — DX: Chronic kidney disease, stage 3 unspecified: N18.30

## 2011-07-02 HISTORY — DX: Chronic kidney disease, stage 3 (moderate): N18.3

## 2011-07-02 HISTORY — DX: Sick sinus syndrome: I49.5

## 2011-07-02 HISTORY — DX: Anemia, unspecified: D64.9

## 2011-07-02 HISTORY — DX: Atherosclerotic heart disease of native coronary artery without angina pectoris: I25.10

## 2011-07-02 HISTORY — DX: Heart failure, unspecified: I50.9

## 2011-07-02 HISTORY — DX: Pure hypercholesterolemia, unspecified: E78.00

## 2011-07-02 HISTORY — DX: Unspecified atrial fibrillation: I48.91

## 2011-07-02 LAB — CBC
HCT: 38 % (ref 36.0–46.0)
Hemoglobin: 12.7 g/dL (ref 12.0–15.0)
MCHC: 33.4 g/dL (ref 30.0–36.0)
WBC: 11.1 10*3/uL — ABNORMAL HIGH (ref 4.0–10.5)

## 2011-07-02 LAB — URINALYSIS, ROUTINE W REFLEX MICROSCOPIC
Glucose, UA: NEGATIVE mg/dL
Leukocytes, UA: NEGATIVE
Nitrite: NEGATIVE
Specific Gravity, Urine: 1.012 (ref 1.005–1.030)
pH: 5 (ref 5.0–8.0)

## 2011-07-02 LAB — DIFFERENTIAL
Basophils Absolute: 0 10*3/uL (ref 0.0–0.1)
Basophils Relative: 0 % (ref 0–1)
Lymphocytes Relative: 11 % — ABNORMAL LOW (ref 12–46)
Monocytes Absolute: 0.9 10*3/uL (ref 0.1–1.0)
Monocytes Relative: 8 % (ref 3–12)
Neutro Abs: 9 10*3/uL — ABNORMAL HIGH (ref 1.7–7.7)
Neutrophils Relative %: 81 % — ABNORMAL HIGH (ref 43–77)

## 2011-07-02 LAB — BASIC METABOLIC PANEL
CO2: 22 mEq/L (ref 19–32)
Chloride: 94 mEq/L — ABNORMAL LOW (ref 96–112)
GFR calc Af Amer: 16 mL/min — ABNORMAL LOW (ref >90)
Potassium: 4.9 mEq/L (ref 3.5–5.1)

## 2011-07-02 LAB — INFLUENZA PANEL BY PCR (TYPE A & B)
Influenza A By PCR: NEGATIVE
Influenza B By PCR: NEGATIVE

## 2011-07-02 LAB — DIGOXIN LEVEL: Digoxin Level: 1.8 ng/mL (ref 0.8–2.0)

## 2011-07-02 LAB — TROPONIN I: Troponin I: <0.3 ng/mL (ref <0.30)

## 2011-07-02 LAB — PROTIME-INR
INR: 2.18 — ABNORMAL HIGH (ref 0.00–1.49)
Prothrombin Time: 24.6 seconds — ABNORMAL HIGH (ref 11.6–15.2)

## 2011-07-02 MED ORDER — NITROGLYCERIN 0.4 MG SL SUBL: 0.4000 mg | SUBLINGUAL_TABLET | SUBLINGUAL | Status: DC | PRN

## 2011-07-02 MED ORDER — PANTOPRAZOLE SODIUM 40 MG PO TBEC
40.0000 mg | DELAYED_RELEASE_TABLET | Freq: Two times a day (BID) | ORAL | Status: DC
  Administered 2011-07-03 – 2011-07-04 (×3): 40 mg via ORAL
  Filled 2011-07-02 (×3): qty 1

## 2011-07-02 MED ORDER — HEPARIN SODIUM (PORCINE) 5000 UNIT/ML IJ SOLN
5000.0000 [IU] | Freq: Three times a day (TID) | INTRAMUSCULAR | Status: DC
  Filled 2011-07-02 (×2): qty 1

## 2011-07-02 MED ORDER — DIGOXIN 0.0625 MG HALF TABLET
62.5000 ug | ORAL_TABLET | Freq: Every day | ORAL | Status: DC
  Administered 2011-07-03: 11:00:00 via ORAL
  Administered 2011-07-04: 62.5 ug via ORAL
  Filled 2011-07-02 (×2): qty 1

## 2011-07-02 MED ORDER — EZETIMIBE 10 MG PO TABS
10.0000 mg | ORAL_TABLET | Freq: Every day | ORAL | Status: DC
  Administered 2011-07-03 – 2011-07-04 (×2): 10 mg via ORAL
  Filled 2011-07-02 (×2): qty 1

## 2011-07-02 MED ORDER — SODIUM CHLORIDE 0.9 % IV SOLN
INTRAVENOUS | Status: AC
  Administered 2011-07-02 – 2011-07-03 (×2): via INTRAVENOUS

## 2011-07-02 MED ORDER — CARVEDILOL 3.125 MG PO TABS
3.1250 mg | ORAL_TABLET | Freq: Two times a day (BID) | ORAL | Status: DC
  Administered 2011-07-03 – 2011-07-04 (×3): 3.125 mg via ORAL
  Filled 2011-07-02 (×5): qty 1

## 2011-07-02 MED ORDER — SODIUM CHLORIDE 0.9 % IV SOLN
INTRAVENOUS | Status: DC
  Administered 2011-07-02: 12:00:00 via INTRAVENOUS

## 2011-07-02 MED ORDER — POTASSIUM CHLORIDE CRYS ER 20 MEQ PO TBCR
20.0000 meq | EXTENDED_RELEASE_TABLET | Freq: Every day | ORAL | Status: DC
  Administered 2011-07-03 – 2011-07-04 (×2): 20 meq via ORAL
  Filled 2011-07-02 (×2): qty 1

## 2011-07-02 MED ORDER — VALSARTAN 40 MG PO TABS
40.0000 mg | ORAL_TABLET | Freq: Every day | ORAL | Status: DC
  Administered 2011-07-03: 11:00:00 via ORAL
  Filled 2011-07-02: qty 1

## 2011-07-02 MED ORDER — NON FORMULARY: 40.0000 mg | Freq: Every day | Status: DC

## 2011-07-02 MED ORDER — WARFARIN SODIUM 2.5 MG PO TABS
2.5000 mg | ORAL_TABLET | Freq: Once | ORAL | Status: DC
  Filled 2011-07-02: qty 1

## 2011-07-02 NOTE — ED Notes (Signed)
MD office drew labs last week, pt in today for recheck of labs, pt has low K+  and High Creatinine.Has been weak, not been eating well, some confusion. Pt is from home. Labs sent over K 4.4, creat 3.6

## 2011-07-02 NOTE — ED Notes (Signed)
admitting MD at bedside

## 2011-07-02 NOTE — ED Notes (Signed)
MD at bedside. 

## 2011-07-02 NOTE — ED Notes (Signed)
Patient transported to X-ray 

## 2011-07-02 NOTE — ED Notes (Signed)
Pt granddaughter, Lynden Ang, can be reached at [phone]

## 2011-07-02 NOTE — H&P (Signed)
Hospital Admission Note Date: 07/02/2011  Patient name: [name] Medical record number: [id number] Date of birth: August 13, 1917 Age: 77 y.o. Gender: female PCP: Thurmon Fair, MD  Medical Service: Internal Medicine Teaching Service  Chief Complaint: Cough for 5 days  History of Present Illness: Patient is a 77 year old woman who is very functional at baseline. 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 She presented to her primary care doctor on Friday after 2-3 days of cough and he obtained lab tests and a chest X-ray. When she followed up today at his office, he sent her to the hospital via ambulance. Patient was recently hospitalized for a CHF exacerbation in October which occurred in the setting of her not taking her Lasix.  Her dry weight was 50 kilograms at that time, her current weight is 45.6 kilograms. She was discharged on an increased dose of lasix 80mg  BID and 5mg  of metolazone which appears to be new. She reports that she can currently walk about 20 ft before she gets out of breath. She previously was able to walk all around the hospital with no problems. She reports that she never felt 100% better after her October admission and she has not returned to her normal volunteer activities since that time.  Meds: Prescriptions prior to admission  Medication Sig Dispense Refill  . carvedilol (COREG) 3.125 MG tablet Take 3.125 mg by mouth 2 (two) times daily with a meal.        . digoxin (LANOXIN) 0.125 MG tablet Take 62.5 mcg by mouth daily.        Marland Kitchen ezetimibe (ZETIA) 10 MG tablet Take 10 mg by mouth daily.        . metolazone (ZAROXOLYN) 5 MG tablet Take 5 mg by mouth daily. 30 minutes before the morning dose of furosemide.       . pantoprazole (PROTONIX) 40 MG tablet Take 40 mg by mouth 2 (two) times daily.        . potassium chloride SA (K-DUR,KLOR-CON) 20 MEQ tablet Take 20 mEq by mouth daily.        . valsartan (DIOVAN) 40 MG tablet Take 40 mg by  mouth daily.        . nitroGLYCERIN (NITROSTAT) 0.4 MG SL tablet Place 0.4 mg under the tongue every 5 (five) minutes x 3 doses as needed. For chest pain        Allergies: Ace inhibitors; Bee; Food; Penicillins; and Aspirin  Past Medical History  Diagnosis Date  . CHF (congestive heart failure)     EF 30-35% on 2D Echo in July 2012  . High cholesterol   . Atrial fibrillation     rate controlled, on chronic coumadin therapy  . Sick sinus syndrome     s/p pacemaker  . Chronic kidney disease (CKD), stage III (moderate)   . Anemia     baseline 7.8-9.0  . Coronary artery disease     stent in 2008 shows CABG to LAD, RCA and circumflex with 100% occlusion   Past Surgical History  Procedure Date  . Exploratory laparotomy w/ bowel resection 2/12    in setting of SBO  . Pacemaker insertion 03/2009  . R breast lumpectomy 05/2004  . Ureterolithotomy 01/2002   No family history on file. History   Social History  . Marital Status: Single    Spouse Name: N/A    Number of Children: N/A  . Years of Education: N/A   Occupational History  .  Not on file.   Social History Main Topics  . Smoking status: Never Smoker   . Smokeless tobacco: Not on file  . Alcohol Use: No  . Drug Use:   . Sexually Active:    Other Topics Concern  . 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 rate 20, height 5\' 6"  (1.676 m), weight 100 lb 8.5 oz (45.6 kg), SpO2 93.00%. General: frail appearing elderly woman resting in bed HEENT: PERRL, EOMI, no scleral icterus, patient has decreased hearing Cardiac: RRR, no rubs, murmurs or gallops Pulm: mild inspiratory rhonchi bilaterally, moving normal volumes of air Abd: soft, nontender, nondistended, BS present Ext: warm and well perfused, no  pedal edema Neuro: alert and oriented X3, cranial nerves II-XII grossly intact  Lab results: Basic Metabolic Panel:  Basename 07/02/11 1152  NA 135  K 4.9  CL 94*  CO2 22  GLUCOSE 118*  BUN 92*  CREATININE 2.76*  CALCIUM 10.1  MG --  PHOS --   CBC:  Basename 07/02/11 1152  WBC 11.1*  NEUTROABS 9.0*  HGB 12.7  HCT 38.0  MCV 94.1  PLT 302   Cardiac Enzymes:  Basename 07/02/11 1152  CKTOTAL --  CKMB --  CKMBINDEX --  TROPONINI <0.30   Coagulation:  Basename 07/02/11 1152  LABPROT 24.6*  INR 2.18*   Urinalysis:   Ref. Range 07/02/2011 12:19  Color, Urine Latest Range: YELLOW  YELLOW  APPearance Latest Range: CLEAR  CLEAR  Specific Gravity, Urine Latest Range: 1.005-1.030  1.012  pH Latest Range: 5.0-8.0  5.0  Glucose, UA Latest Range: NEGATIVE mg/dL NEGATIVE  Bilirubin Urine Latest Range: NEGATIVE  NEGATIVE  Ketones, ur Latest Range: NEGATIVE mg/dL NEGATIVE  Protein Latest Range: NEGATIVE mg/dL NEGATIVE  Urobilinogen, UA Latest Range: 0.0-1.0 mg/dL 0.2  Nitrite Latest Range: NEGATIVE  NEGATIVE  Leukocytes, UA Latest Range: NEGATIVE  NEGATIVE  Hgb urine dipstick Latest Range: NEGATIVE  NEGATIVE   Imaging results:  Dg Chest 2 View  07/02/2011  *RADIOLOGY REPORT*  Clinical Data: Cough  CHEST - 2 VIEW  Comparison: 06/28/2011  Findings: Cardiomediastinal silhouette is stable.  Cardiomegaly again noted.  Stable central bronchitic changes single lead cardiac pacemaker is unchanged in position.  No acute infiltrate or pulmonary edema.  Osteopenia and mild degenerative changes thoracic spine.  IMPRESSION: No acute infiltrate or pulmonary edema.  Stable central bronchitic changes.  Cardiomegaly again noted.   Other results: EKG:   Assessment & Plan by Problem: Patient is a 77 year old woman who presents for 5 days of cough to PCP. Found to have acute kidney injury and was admitted. Likely occurred in the setting of decreased PO intake since she wasn't feeling well  and had low appetite. Patient lives alone so is at risk of not getting enough fluids.  AKI:  -admit as observation overnight -patient's creatinine is elevated from baseline of 1.0-1.5 to 2.78, will recheck in AM -likely prerenal in setting of increased Lasix and new metalozone and decreased PO intake -will replete fluid with 75 mL per hour -will hold lasix, metolazone overnight  CHF: -will continue digoxin, coreg -strict ins and outs, daily weights, low salt diet  Atrial Fibrillation: -continue home coumadin, is currently in therapeutic range  CAD: -continue home Zetia, nitroglycerin PRN and  AM EKG  Upper respiratory Infection: -symptomatic management, consider mucinex  SignedMargorie John 07/02/2011, 8:21 PM

## 2011-07-02 NOTE — ED Provider Notes (Signed)
History     CSN: [id number] Arrival date & time: 07/02/2011 11:29 AM   First MD Initiated Contact with Patient 07/02/11 1137      Chief Complaint  Patient presents with  . Abnormal Lab  . Altered Mental Status   level V caveat due to altered mental status.  (Consider location/radiation/quality/duration/timing/severity/associated sxs/prior treatment) Patient is a 77 y.o. female presenting with altered mental status. The history is provided by the patient and the nursing home.  Altered Mental Status   patient was reportedly sent in for low potassium and worsening creatinine. She's reportedly lost weight and been more confused. His lab work from 4 days ago with her. She's a history of CHF with stopping her diuretic in the past. She states she's been coughing up she does not have pneumonia. No chest pain. She has mild confusion.  Past Medical History  Diagnosis Date  . CHF (congestive heart failure)   . High cholesterol     History reviewed. No pertinent past surgical history.  No family history on file.  History  Substance Use Topics  . Smoking status: Never Smoker   . Smokeless tobacco: Not on file  . Alcohol Use: No    OB History    Grav Para Term Preterm Abortions TAB SAB Ect Mult Living                  Review of Systems  Unable to perform ROS: Mental status change  Psychiatric/Behavioral: Positive for altered mental status.    Allergies  Ace inhibitors; Bee; Food; Penicillins; and Aspirin  Home Medications   Current Outpatient Rx  Name Route Sig Dispense Refill  . CARVEDILOL 3.125 MG PO TABS Oral Take 3.125 mg by mouth 2 (two) times daily with a meal.      . DIGOXIN 0.125 MG PO TABS Oral Take 62.5 mcg by mouth daily.      Marland Kitchen EZETIMIBE 10 MG PO TABS Oral Take 10 mg by mouth daily.      Marland Kitchen METOLAZONE 5 MG PO TABS Oral Take 5 mg by mouth daily. 30 minutes before the morning dose of furosemide.     Marland Kitchen PANTOPRAZOLE SODIUM 40 MG PO TBEC Oral Take 40 mg by mouth 2  (two) times daily.      Marland Kitchen POTASSIUM CHLORIDE CRYS CR 20 MEQ PO TBCR Oral Take 20 mEq by mouth daily.      Marland Kitchen VALSARTAN 40 MG PO TABS Oral Take 40 mg by mouth daily.      . WARFARIN SODIUM 5 MG PO TABS Oral Take 2.5-5 mg by mouth daily. Takes 1 tablet on Monday and Thursday. 0.5 tablet on all other days.    Marland Kitchen NITROGLYCERIN 0.4 MG SL SUBL Sublingual Place 0.4 mg under the tongue every 5 (five) minutes x 3 doses as needed. For chest pain       BP 114/60  Pulse 60  Temp(Src) 97.3 F (36.3 C) (Oral)  Resp 10  SpO2 97%  Physical Exam  Constitutional: She appears well-developed.  HENT:  Head: Normocephalic.  Eyes: Pupils are equal, round, and reactive to light.  Cardiovascular: Normal rate.   Pulmonary/Chest: Effort normal.       Cough with clear lung sounds.  Abdominal: Soft.  Musculoskeletal: Normal range of motion.  Neurological: She is alert.       Mild confusion, but pleasant.  Skin: Skin is warm.    ED Course  Procedures (including critical care time)  Labs Reviewed  CBC -  Abnormal; Notable for the following:    WBC 11.1 (*)    All other components within normal limits  DIFFERENTIAL - Abnormal; Notable for the following:    Neutrophils Relative 81 (*)    Neutro Abs 9.0 (*)    Lymphocytes Relative 11 (*)    All other components within normal limits  BASIC METABOLIC PANEL - Abnormal; Notable for the following:    Chloride 94 (*)    Glucose, Bld 118 (*)    BUN 92 (*)    Creatinine, Ser 2.76 (*)    GFR calc non Af Amer 14 (*)    GFR calc Af Amer 16 (*)    All other components within normal limits  PROTIME-INR - Abnormal; Notable for the following:    Prothrombin Time 24.6 (*)    INR 2.18 (*)    All other components within normal limits  URINALYSIS, ROUTINE W REFLEX MICROSCOPIC  TROPONIN I  DIGOXIN LEVEL   Dg Chest 2 View  07/02/2011  *RADIOLOGY REPORT*  Clinical Data: Cough  CHEST - 2 VIEW  Comparison: 06/28/2011  Findings: Cardiomediastinal silhouette is stable.   Cardiomegaly again noted.  Stable central bronchitic changes single lead cardiac pacemaker is unchanged in position.  No acute infiltrate or pulmonary edema.  Osteopenia and mild degenerative changes thoracic spine.  IMPRESSION: No acute infiltrate or pulmonary edema.  Stable central bronchitic changes.  Cardiomegaly again noted.  Original Report Authenticated By: Natasha Mead, M.D.     1. Renal insufficiency     Date: 07/02/2011  Rate: 60  Rhythm: electronically paced  QRS Axis: normal  Intervals: electronically paced  ST/T Wave abnormalities: electronically paced  Conduction Disutrbances:electronically paced  Narrative Interpretation:   Old EKG Reviewed: unchanged     MDM  Patient was sent in by PCP for hypertension and worsening renal function. Creatinine has increased from baseline. History 0.6 at the office 2.7 here at baseline appears to be around 1.5. She has been coughing but her chest x-ray does not show pneumonia. His urine does not show infection. She'll be admitted to medicine.        Juliet Rude. Rubin Payor, MD 07/02/11 1432

## 2011-07-02 NOTE — ED Notes (Signed)
Labwork from physician given to Dr. Pickering/placed in pt chart

## 2011-07-02 NOTE — Progress Notes (Signed)
ANTICOAGULATION CONSULT NOTE - Initial Consult  Pharmacy Consult for  Warfarin Indication: atrial fibrillation  Allergen  . Ace Inhibitors - Cough  . Bee - Unknown  . Food - Berries/Tomato  . Penicillins - Swelling  . Aspirin - Rash and GI distress    Patient Measurements: Weight 51.5 kg Height 66 inches   Vital Signs: Temp: 97.3 F (36.3 C) (12/10 1133) Temp src: Oral (12/10 1133) BP: 101/56 mmHg (12/10 1521) Pulse Rate: 60  (12/10 1521)  Labs:  Basename 07/02/11 1152  HGB 12.7  HCT 38.0  PLT 302  APTT --  LABPROT 24.6*  INR 2.18*  HEPARINUNFRC --  CREATININE 2.76*  CKTOTAL --  CKMB --  TROPONINI <0.30   The CrCl is unknown because both a height and weight (above a minimum accepted value) are required for this calculation.  Medical History: Past Medical History  Diagnosis Date  . CHF (congestive heart failure)   . High cholesterol     Medications:  Coreg 3.125mg  twice daily Digoxin 0.0625mg  daily Zetia 10 mg daily Metolazone 5 mg daily Patoprazole 40 mg daily Potassium 20 meq daily Valsartan 40 mg daily Warfarin 5 mg - takes 2.5mg  daily except 5mg  on Monday and Thursday  Assessment: 77 year old with past medical history for CHF with Afib.  She has no noted past bleeding complications and none this admit.  Goal of Therapy:  INR 2.0-3.0   Plan:  She is currently within desired goal range and has had her dose today.  We will start her on her home regimen starting tomorrow. F/u PT/INR and adjust   Nadara Mustard, PharmD., MS 07/02/2011,4:29 PM

## 2011-07-03 ENCOUNTER — Encounter (HOSPITAL_COMMUNITY): Payer: Self-pay | Admitting: General Practice

## 2011-07-03 DIAGNOSIS — N289 Disorder of kidney and ureter, unspecified: Secondary | ICD-10-CM

## 2011-07-03 DIAGNOSIS — J069 Acute upper respiratory infection, unspecified: Secondary | ICD-10-CM | POA: Diagnosis present

## 2011-07-03 LAB — BASIC METABOLIC PANEL
CO2: 23 mEq/L (ref 19–32)
Calcium: 8.8 mg/dL (ref 8.4–10.5)
Creatinine, Ser: 1.87 mg/dL — ABNORMAL HIGH (ref 0.50–1.10)
Glucose, Bld: 113 mg/dL — ABNORMAL HIGH (ref 70–99)

## 2011-07-03 LAB — PROTIME-INR: INR: 3.13 — ABNORMAL HIGH (ref 0.00–1.49)

## 2011-07-03 MED ORDER — SODIUM CHLORIDE 0.9 % IV SOLN
INTRAVENOUS | Status: AC
  Administered 2011-07-03: 21:00:00 via INTRAVENOUS

## 2011-07-03 NOTE — Progress Notes (Signed)
Subjective: Patient is doing well, has no acute complaints. Continued mild cough, no chest pain. BM today, was solid. No abdominal pain.  Objective: Vital signs in last 24 hours: Filed Vitals:   07/02/11 2014 07/03/11 0555 07/03/11 0952 07/03/11 1358  BP: 109/58 92/44 94/51  96/48  Pulse: 62 60 60 64  Temp:  97.6 F (36.4 C) 97.7 F (36.5 C) 97.8 F (36.6 C)  TempSrc:  Oral Oral Oral  Resp: 20 16 16 16   Height:      Weight:      SpO2:  100% 97% 96%   Weight change:   Intake/Output Summary (Last 24 hours) at 07/03/11 1648 Last data filed at 07/03/11 1358  Gross per 24 hour  Intake    485 ml  Output      0 ml  Net    485 ml   Physical Exam: General: frail friendly elderly woman resting in bed HEENT: PERRL, EOMI, no scleral icterus Cardiac: RRR, 3/6 murmur heard Pulm: clear to auscultation bilaterally, moving normal volumes of air Abd: soft, nontender, nondistended, BS present Ext: warm and well perfused, no pedal edema Neuro: alert and oriented X3, cranial nerves II-XII grossly intact  Lab Results: Basic Metabolic Panel:  Lab 07/03/11 1191 07/02/11 1152  NA 140 135  K 3.7 4.9  CL 107 94*  CO2 23 22  GLUCOSE 113* 118*  BUN 69* 92*  CREATININE 1.87* 2.76*  CALCIUM 8.8 10.1  MG -- --  PHOS -- --   CBC:  Lab 07/02/11 1152  WBC 11.1*  NEUTROABS 9.0*  HGB 12.7  HCT 38.0  MCV 94.1  PLT 302   Coagulation:  Lab 07/03/11 0626 07/02/11 1152  LABPROT 32.7* 24.6*  INR 3.13* 2.18*   Medications: I have reviewed the patient's current medications. Scheduled Meds:   . carvedilol  3.125 mg Oral BID WC  . digoxin  62.5 mcg Oral Daily  . ezetimibe  10 mg Oral Daily  . pantoprazole  40 mg Oral BID WC  . potassium chloride SA  20 mEq Oral Daily  . DISCONTD: heparin  5,000 Units Subcutaneous Q8H  . DISCONTD: NON FORMULARY 40 mg  40 mg Oral Daily  . DISCONTD: valsartan  40 mg Oral Daily  . DISCONTD: warfarin  2.5 mg Oral ONCE-1800   Continuous Infusions:   .  sodium chloride 125 mL/hr at 07/03/11 0422  . sodium chloride 75 mL/hr at 07/03/11 1245   PRN Meds:.nitroGLYCERIN  Assessment/Plan: Assessment & Plan by Problem: Patient is a 77 year old woman who presents for 5 days of cough to PCP. Found to have acute kidney injury and was admitted. Likely occurred in the setting of decreased PO intake since she wasn't feeling well and had low appetite. Patient lives alone so is at risk of not getting enough fluids.   AKI:  -admitted as inpatient -patient's creatinine is elevated from baseline of 1.0-1.5 to 2.78, is down to 1.87 today -likely prerenal in setting of increased Lasix and new metalozone and decreased PO intake, improved dramatically with fluids -will replete fluid with 75 mL per hour overnight, since patient still appears dry -will hold lasix, metolazone, and diovan on discharge tomorrow with close follow up with PCP  CHF:  -will continue digoxin, coreg  -strict ins and outs, daily weights, low salt diet   Atrial Fibrillation:  -continue home coumadin, INR elevated today, so dose held per pharmacy  CAD:  -continue home Zetia, nitroglycerin PRN  Upper respiratory Infection:  -symptomatic  management -influenza negative   LOS: 1 day   Margorie John 07/03/2011, 4:48 PM

## 2011-07-03 NOTE — H&P (Signed)
Internal Medicine Teaching Service Attending Note Date: 07/03/2011  Patient name: [name]  Medical record number: [id number]  Date of birth: [birth year]   I have seen and evaluated [name] and discussed their care with the Residency Team.  Patient admitted dehydrated, I think she is still dehydrated. We have just brought her some ginger ale and apple juice and she feels like she can keep it down.  Physical Exam: Blood pressure 94/51, pulse 60, temperature 97.7 F (36.5 C), temperature source Oral, resp. rate 16, height 5\' 6"  (1.676 m), weight 100 lb 8.5 oz (45.6 kg), SpO2 97.00%. Frail, sweet patient in NAD Lungs-some occ. rhonchi Heart-RRR, no extra sounds abdom-+BS, nontender extrem-no edema Neuro-A&O x3, non-focal  Lab results: Results for orders placed during the hospital encounter of 07/02/11 (from the past 24 hour(s))  INFLUENZA PANEL BY PCR     Status: Normal   Collection Time   07/02/11  5:13 PM      Component Value Range   Influenza A By PCR NEGATIVE  NEGATIVE    Influenza B By PCR NEGATIVE  NEGATIVE    H1N1 flu by pcr NOT DETECTED  NOT DETECTED   PROTIME-INR     Status: Abnormal   Collection Time   07/03/11  6:26 AM      Component Value Range   Prothrombin Time 32.7 (*) 11.6 - 15.2 (seconds)   INR 3.13 (*) 0.00 - 1.49     Imaging results:  Dg Chest 2 View  07/02/2011  *RADIOLOGY REPORT*  Clinical Data: Cough  CHEST - 2 VIEW  Comparison: 06/28/2011  Findings: Cardiomediastinal silhouette is stable.  Cardiomegaly again noted.  Stable central bronchitic changes single lead cardiac pacemaker is unchanged in position.  No acute infiltrate or pulmonary edema.  Osteopenia and mild degenerative changes thoracic spine.  IMPRESSION: No acute infiltrate or pulmonary edema.  Stable central bronchitic changes.  Cardiomegaly again noted.  Original Report Authenticated By: Natasha Mead, M.D.    Assessment and Plan: I agree with the formulated Assessment and Plan with  the following changes:  Patient's diuretics were held. Will also hold ARB while she is not taking in po well. Follow up B[facility] is pending. Have encouraged her to take in po liquids and will follow. Her flu testing is negative.

## 2011-07-03 NOTE — Progress Notes (Signed)
ANTICOAGULATION CONSULT NOTE - Follow Up Consult  Pharmacy Consult for Warfarin Indication: atrial fibrillation  Allergies  Allergen Reactions  . Ace Inhibitors Cough  . Bee Other (See Comments)    Unknown.  . Food Other (See Comments)    Berries,tomato  . Penicillins Swelling  . Aspirin Rash and Other (See Comments)    GI distress    Patient Measurements: Height: 5\' 6"  (167.6 cm) Weight: 100 lb 8.5 oz (45.6 kg) IBW/kg (Calculated) : 59.3   Vital Signs: Temp: 97.7 F (36.5 C) (12/11 0952) Temp src: Oral (12/11 0952) BP: 94/51 mmHg (12/11 0952) Pulse Rate: 60  (12/11 0952)  Labs:  Basename 07/03/11 0626 07/02/11 1152  HGB -- 12.7  HCT -- 38.0  PLT -- 302  APTT -- --  LABPROT 32.7* 24.6*  INR 3.13* 2.18*  HEPARINUNFRC -- --  CREATININE -- 2.76*  CKTOTAL -- --  CKMB -- --  TROPONINI -- <0.30   Estimated Creatinine Clearance: 9.6 ml/min (by C-G formula based on Cr of 2.76).   Medications:  Scheduled:    . carvedilol  3.125 mg Oral BID WC  . digoxin  62.5 mcg Oral Daily  . ezetimibe  10 mg Oral Daily  . pantoprazole  40 mg Oral BID WC  . potassium chloride SA  20 mEq Oral Daily  . warfarin  2.5 mg Oral ONCE-1800  . DISCONTD: heparin  5,000 Units Subcutaneous Q8H  . DISCONTD: NON FORMULARY 40 mg  40 mg Oral Daily  . DISCONTD: valsartan  40 mg Oral Daily    Assessment: 77 yo F on Coumadin PTA for CHF with Afib.  INR has increased 2.18-->3.13 overnight.  No new medication interactions noted.  Possible increase related to decrease PO intake.  Goal of Therapy:  INR 2-3   Plan:  No Coumadin today.  Will d/c previously ordered Coumadin 2.5 mg dose for tonight.  Follow-up with INR in AM.  Dixie Dials, Pharm.D., BCPS Clinical Pharmacist Pager [phone]  07/03/2011,11:48 AM

## 2011-07-03 NOTE — Progress Notes (Signed)
Patient grand daughter [name] called to check on patient. Patient asleep/resting at present time.

## 2011-07-04 LAB — CBC
HCT: 28.3 % — ABNORMAL LOW (ref 36.0–46.0)
MCHC: 32.2 g/dL (ref 30.0–36.0)
MCV: 94 fL (ref 78.0–100.0)
RDW: 15.2 % (ref 11.5–15.5)

## 2011-07-04 LAB — BASIC METABOLIC PANEL
BUN: 50 mg/dL — ABNORMAL HIGH (ref 6–23)
CO2: 21 mEq/L (ref 19–32)
Chloride: 111 mEq/L (ref 96–112)
Creatinine, Ser: 1.45 mg/dL — ABNORMAL HIGH (ref 0.50–1.10)
Glucose, Bld: 87 mg/dL (ref 70–99)

## 2011-07-04 LAB — PROTIME-INR: Prothrombin Time: 30.6 seconds — ABNORMAL HIGH (ref 11.6–15.2)

## 2011-07-04 MED ORDER — WARFARIN SODIUM 2.5 MG PO TABS
2.5000 mg | ORAL_TABLET | Freq: Once | ORAL | Status: DC
  Filled 2011-07-04: qty 1

## 2011-07-04 MED ORDER — WARFARIN SODIUM 5 MG PO TABS: 2.5000 mg | ORAL_TABLET | Freq: Every day | ORAL | Status: DC

## 2011-07-04 NOTE — Progress Notes (Signed)
ANTICOAGULATION CONSULT NOTE - Follow Up Consult  Pharmacy Consult for Warfarin  Indication: atrial fibrillation  Allergies  Allergen Reactions  . Ace Inhibitors Cough  . Bee Other (See Comments)    Unknown.  . Food Other (See Comments)    Berries,tomato  . Penicillins Swelling  . Aspirin Rash and Other (See Comments)    GI distress    Patient Measurements: Height: 5\' 6"  (167.6 cm) Weight: 100 lb 5 oz (45.5 kg) IBW/kg (Calculated) : 59.3   Vital Signs: Temp: 98 F (36.7 C) (12/12 1000) Temp src: Oral (12/12 1000) BP: 126/63 mmHg (12/12 1000) Pulse Rate: 61  (12/12 1000)  Labs:  Basename 07/04/11 0650 07/03/11 1152 07/03/11 0626 07/02/11 1152  HGB 9.1* -- -- 12.7  HCT 28.3* -- -- 38.0  PLT 254 -- -- 302  APTT -- -- -- --  LABPROT 30.6* -- 32.7* 24.6*  INR 2.88* -- 3.13* 2.18*  HEPARINUNFRC -- -- -- --  CREATININE 1.45* 1.87* -- 2.76*  CKTOTAL -- -- -- --  CKMB -- -- -- --  TROPONINI -- -- -- <0.30   Estimated Creatinine Clearance: 18.2 ml/min (by C-G formula based on Cr of 1.45).   Medications:  Scheduled:    . carvedilol  3.125 mg Oral BID WC  . digoxin  62.5 mcg Oral Daily  . ezetimibe  10 mg Oral Daily  . pantoprazole  40 mg Oral BID WC  . potassium chloride SA  20 mEq Oral Daily  . DISCONTD: valsartan  40 mg Oral Daily  . DISCONTD: warfarin  2.5 mg Oral ONCE-1800   Goal of Therapy:  INR 2-3  Assessment and Plan: 77 yo F on Warfarin PTA for AFib.  INR has returned to within goal after dose held yesterday.  Will restart with home regimen of 2.5mg  tonight.  [employer], Pharm.D., BCPS Clinical Pharmacist Pager [phone]  07/04/2011,11:13 AM

## 2011-07-04 NOTE — Discharge Summary (Signed)
Internal Medicine Teaching [facility] Discharge Note  Name: [name] MRN: [id number] DOB: 02-08-1918 77 y.o.  Date of Admission: 07/02/2011 11:29 AM Date of Discharge: 07/04/2011 Attending Physician: Zoila Shutter  Discharge Diagnosis:  Acute Kidney Injury- baseline creatinine for 1.0 to 1.5  Chronic diastolic heart failure- EF is 40-98% on Echo in July 2012  Upper respiratory infection  Chronic Anticoagulation for A fib  Discharge Medications: Current Discharge Medication List    CONTINUE these medications which have CHANGED   Details  warfarin (COUMADIN) 5 MG tablet Take 0.5 tablets (2.5 mg total) by mouth daily. Takes 0.5 tablet (2.5 mg) of coumadin each day until you see your primary care doctor. Qty: 30 tablet, Refills: 0      CONTINUE these medications which have NOT CHANGED   Details  carvedilol (COREG) 3.125 MG tablet Take 3.125 mg by mouth 2 (two) times daily with a meal.      digoxin (LANOXIN) 0.125 MG tablet Take 62.5 mcg by mouth daily.      ezetimibe (ZETIA) 10 MG tablet Take 10 mg by mouth daily.      pantoprazole (PROTONIX) 40 MG tablet Take 40 mg by mouth 2 (two) times daily.      potassium chloride SA (K-DUR,KLOR-CON) 20 MEQ tablet Take 20 mEq by mouth daily.      valsartan (DIOVAN) 40 MG tablet Take 40 mg by mouth daily.      nitroGLYCERIN (NITROSTAT) 0.4 MG SL tablet Place 0.4 mg under the tongue every 5 (five) minutes x 3 doses as needed. For chest pain       STOP taking these medications     metolazone (ZAROXOLYN) 5 MG tablet        Disposition and follow-up:   [name] was discharged from [facility] in Stable condition.    Follow-up Appointments: Follow-up Information    Follow up with BOUSKA,DAVID E in 2 days. (Appt is made for Fiday 14th at 11:00am)    Contact information:   [address] [place] [address] [phone]      Discharge Orders    Future Orders Please  Complete By Expires   Diet - low sodium heart healthy      Increase activity slowly      Discharge instructions      Comments:   Do not start your lasix or metolazone until your family doctor sees you and wants to start it. If you are not feeling well and are not drinking and eating normally, please stop your lasix and metolazone so you do not become dehydrated since these medications cause you to lose fluid. Avoid salty foods and don't add salt to food.   (HEART FAILURE PATIENTS) Call MD:  Anytime you have any of the following symptoms: 1) 3 pound weight gain in 24 hours or 5 pounds in 1 week 2) shortness of breath, with or without a dry hacking cough 3) swelling in the hands, feet or stomach 4) if you have to sleep on extra pillows at night in order to breathe.      Call MD for:  temperature >100.4        Consultations: none  Procedures Performed:  Dg Chest 2 View  07/02/2011  *RADIOLOGY REPORT*  Clinical Data: Cough  CHEST - 2 VIEW  Comparison: 06/28/2011  Findings: Cardiomediastinal silhouette is stable.  Cardiomegaly again noted.  Stable central bronchitic changes single lead cardiac pacemaker is unchanged in position.  No  acute infiltrate or pulmonary edema.  Osteopenia and mild degenerative changes thoracic spine.  IMPRESSION: No acute infiltrate or pulmonary edema.  Stable central bronchitic changes.  Cardiomegaly again noted.  Original Report Authenticated By: Natasha Mead, M.D.   Dg Chest 2 View  06/28/2011  *RADIOLOGY REPORT*  Clinical Data: Cough and fatigue.  CHEST - 2 VIEW  Comparison: Chest x-ray 05/09/2011.  Findings: The heart is borderline enlarged but stable.  Stable surgical changes from bypass surgery. The right ventricular pacer wire is unchanged.  The lungs demonstrate mild chronic bronchitic type changes but no acute pulmonary findings.  No pleural effusion. The bony thorax is intact.  IMPRESSION:  1.  Mild chronic bronchitic type lung changes without acute overlying pulmonary  process. 2.  Stable mild cardiac enlargement.  Original Report Authenticated By: P. Loralie Champagne, M.D.   Admission HPI: Patient is a 77 year old woman who is very functional at baseline. 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 She presented to her primary care doctor on Friday after 2-3 days of cough and he obtained lab tests and a chest X-ray. When she followed up today at his office, he sent her to the hospital via ambulance. Patient was recently hospitalized for a CHF exacerbation in October which occurred in the setting of her not taking her Lasix. Her dry weight was 50 kilograms at that time, her current weight is 45.6 kilograms. She was discharged on an increased dose of lasix 80mg  BID and 5mg  of metolazone which appears to be new at that time. She reports that she can currently walk about 20 ft before she gets out of breath. She previously was able to walk all around the hospital with no problems. She reports that she never felt 100% better after her October admission and she has not returned to her normal volunteer activities since that time. She is under the impression that her doctor sent her to the hospital with pneumonia.  Admitting Physical Exam:  Blood pressure 109/58, pulse 62, temperature 97.6 F (36.4 C), temperature source Oral, resp. rate 20, height 5\' 6"  (1.676 m), weight 100 lb 8.5 oz (45.6 kg), SpO2 93.00%.  General: frail appearing elderly woman resting in bed  HEENT: PERRL, EOMI, no scleral icterus, patient has decreased hearing  Cardiac: RRR, no rubs, murmurs or gallops  Pulm: mild inspiratory rhonchi bilaterally, moving normal volumes of air  Abd: soft, nontender, nondistended, BS present  Ext: warm and well perfused, no pedal edema  Neuro: alert and oriented X3, cranial nerves II-XII grossly intact  Hospital Course by problem list:    Acute Kidney Injury: patient was sent from family practitioner and was found to have acute kidney injury  with creatinine of 2.78. This was likely pre-renal in nature as it resolved overnight with gentle IV fluid and encouraging increasing intake. On records review it seems that patient's lasix dose was doubled and metolazone was started after her last admission in October to cardiology service. She reports never feeling 100% back to her baseline since this time so it is possible that this is to high of a dose and patient has been mildly dehydrated for the past few weeks. This episode was likely triggered by not feeling well with an URI for 2-3 days during which time the patient did not drink or eat as much as she normally does. While in the hospital her lasix and metolazone were held and her Diovan was also held. She was repleted with 75mL  of normal saline per hour with 2L total. We also encouraged her to drink more fluids even if she was not thirsty. We were concerned about sending her home after observing her for 1 night so she was kept for a 2nd day since she is at risk of becoming dehydrated as she lives alone.   Chronic diastolic heart failure: Lasix and metolazone were held since admission. Please restart as appropriate when you see her in 2 days. Patient's weight did not change at 100lbs during admission.   Upper respiratory infection: Patient has mild cough and scant white sputum production which seems to have improved. She was tested for influenza and was negative.  Chronic Coumadin Therapy: Patient's INR was normal on admission 2. 88 but was slightly elevated on 11th at 3.13 so one dose was held. She was given 2.5mg  on the 12th since INR returned to therapeutic range at 2.18. She was instructed to take 2.5 mg daily until she sees her doctor and she can be started on a new regimen. Please restart as appropriate.  Discharge Vitals:  BP 126/63  Pulse 61  Temp(Src) 98 F (36.7 C) (Oral)  Resp 18  Ht 5\' 6"  (1.676 m)  Wt 100 lb 5 oz (45.5 kg)  BMI 16.19 kg/m2  SpO2 94%  Discharge Labs:  Results  for orders placed during the hospital encounter of 07/02/11 (from the past 24 hour(s))  PROTIME-INR     Status: Abnormal   Collection Time   07/04/11  6:50 AM      Component Value Range   Prothrombin Time 30.6 (*) 11.6 - 15.2 (seconds)   INR 2.88 (*) 0.00 - 1.49   BASIC METABOLIC PANEL     Status: Abnormal   Collection Time   07/04/11  6:50 AM      Component Value Range   Sodium 143  135 - 145 (mEq/L)   Potassium 3.8  3.5 - 5.1 (mEq/L)   Chloride 111  96 - 112 (mEq/L)   CO2 21  19 - 32 (mEq/L)   Glucose, Bld 87  70 - 99 (mg/dL)   BUN 50 (*) 6 - 23 (mg/dL)   Creatinine, Ser 1.19 (*) 0.50 - 1.10 (mg/dL)   Calcium 8.7  8.4 - 14.7 (mg/dL)   GFR calc non Af Amer 30 (*) >90 (mL/min)   GFR calc Af Amer 35 (*) >90 (mL/min)  CBC     Status: Abnormal   Collection Time   07/04/11  6:50 AM      Component Value Range   WBC 10.9 (*) 4.0 - 10.5 (K/uL)   RBC 3.01 (*) 3.87 - 5.11 (MIL/uL)   Hemoglobin 9.1 (*) 12.0 - 15.0 (g/dL)   HCT 82.9 (*) 56.2 - 46.0 (%)   MCV 94.0  78.0 - 100.0 (fL)   MCH 30.2  26.0 - 34.0 (pg)   MCHC 32.2  30.0 - 36.0 (g/dL)   RDW 13.0  86.5 - 78.4 (%)   Platelets 254  150 - 400 (K/uL)   Signed: THOMAS, Katrisha Segall 07/04/2011, 2:02 PM

## 2011-07-04 NOTE — Progress Notes (Signed)
Discharge Note:  S: patient is doing fine, no complaint. No pain. She has mild cough.   O:  Filed Vitals:   07/04/11 1000  BP: 126/63  Pulse: 61  Temp: 98 F (36.7 C)  Resp: 18   General: frail elderly woman resting in bed HEENT: PERRL, EOMI, no scleral icterus Cardiac: RRR, 3/6 systolic murmur heard Pulm: clear to auscultation bilaterally, moving normal volumes of air Abd: soft, nontender, nondistended, BS present Ext: warm and well perfused, no pedal edema Neuro: alert and oriented X3, cranial nerves II-XII grossly intact, strength and sensation to light touch equal in bilateral upper and lower extremities  A/P: Discharge to home with close follow up with family practitioner in 2 days to restart her diuretics.

## 2011-07-04 NOTE — Progress Notes (Signed)
Internal Medicine Teaching Service Attending Note Date: 07/04/2011  Patient name: [name]  Medical record number: [id number]  Date of birth: [birth year]    This patient has been seen and discussed with the house staff. Please see their note for complete details. I concur with their findings with the following additions/corrections:  Patient looks great and is ready to go home. Her Cr has gone down to 1.47.  We will have her follow up with her PCP soon to get her started back on her diuretics and ARB.  Arsal Tappan E 07/04/2011, 12:12 PM

## 2011-07-11 ENCOUNTER — Ambulatory Visit
Admission: RE | Admit: 2011-07-11 | Discharge: 2011-07-11 | Disposition: A | Discharge status: Home / Self Care | Payer: Medicare Other | Source: Ambulatory Visit | Attending: Family Medicine | Admitting: Family Medicine

## 2011-07-11 ENCOUNTER — Other Ambulatory Visit: Payer: Self-pay | Admitting: Family Medicine

## 2011-07-11 DIAGNOSIS — R05 Cough: Secondary | ICD-10-CM

## 2011-07-30 ENCOUNTER — Other Ambulatory Visit: Payer: Self-pay | Admitting: Family Medicine

## 2011-07-30 ENCOUNTER — Ambulatory Visit
Admission: RE | Admit: 2011-07-30 | Discharge: 2011-07-30 | Disposition: A | Discharge status: Home / Self Care | Payer: Medicare Other | Source: Ambulatory Visit | Attending: Family Medicine | Admitting: Family Medicine

## 2011-07-30 DIAGNOSIS — I509 Heart failure, unspecified: Secondary | ICD-10-CM

## 2012-01-30 ENCOUNTER — Ambulatory Visit
Admission: RE | Admit: 2012-01-30 | Discharge: 2012-01-30 | Disposition: A | Discharge status: Home / Self Care | Payer: Medicare Other | Source: Ambulatory Visit | Attending: Family Medicine | Admitting: Family Medicine

## 2012-01-30 ENCOUNTER — Other Ambulatory Visit: Payer: Self-pay | Admitting: Family Medicine

## 2012-01-30 DIAGNOSIS — R0781 Pleurodynia: Secondary | ICD-10-CM

## 2012-04-09 ENCOUNTER — Other Ambulatory Visit: Payer: Self-pay | Admitting: Family Medicine

## 2012-04-09 DIAGNOSIS — Z1231 Encounter for screening mammogram for malignant neoplasm of breast: Secondary | ICD-10-CM

## 2012-05-19 ENCOUNTER — Ambulatory Visit
Admission: RE | Admit: 2012-05-19 | Discharge: 2012-05-19 | Disposition: A | Discharge status: Home / Self Care | Payer: Medicare Other | Source: Ambulatory Visit | Attending: Family Medicine | Admitting: Family Medicine

## 2012-05-19 DIAGNOSIS — Z1231 Encounter for screening mammogram for malignant neoplasm of breast: Secondary | ICD-10-CM

## 2012-06-13 ENCOUNTER — Ambulatory Visit
Admission: RE | Admit: 2012-06-13 | Discharge: 2012-06-13 | Disposition: A | Discharge status: Home / Self Care | Payer: Medicare Other | Source: Ambulatory Visit | Attending: Family Medicine | Admitting: Family Medicine

## 2012-06-13 ENCOUNTER — Other Ambulatory Visit: Payer: Self-pay | Admitting: Family Medicine

## 2012-06-13 DIAGNOSIS — R634 Abnormal weight loss: Secondary | ICD-10-CM

## 2012-09-08 ENCOUNTER — Encounter (HOSPITAL_COMMUNITY): Payer: Self-pay | Admitting: Emergency Medicine

## 2012-09-08 ENCOUNTER — Emergency Department (HOSPITAL_COMMUNITY): Payer: Medicare Other

## 2012-09-08 ENCOUNTER — Emergency Department (HOSPITAL_COMMUNITY)
Admission: EM | Admit: 2012-09-08 | Discharge: 2012-09-08 | Disposition: A | Discharge status: Home / Self Care | Payer: Medicare Other | Attending: Emergency Medicine | Admitting: Emergency Medicine

## 2012-09-08 DIAGNOSIS — I4891 Unspecified atrial fibrillation: Secondary | ICD-10-CM | POA: Insufficient documentation

## 2012-09-08 DIAGNOSIS — R079 Chest pain, unspecified: Secondary | ICD-10-CM

## 2012-09-08 DIAGNOSIS — D649 Anemia, unspecified: Secondary | ICD-10-CM | POA: Insufficient documentation

## 2012-09-08 DIAGNOSIS — Z950 Presence of cardiac pacemaker: Secondary | ICD-10-CM

## 2012-09-08 DIAGNOSIS — I340 Nonrheumatic mitral (valve) insufficiency: Secondary | ICD-10-CM

## 2012-09-08 DIAGNOSIS — I2510 Atherosclerotic heart disease of native coronary artery without angina pectoris: Secondary | ICD-10-CM | POA: Diagnosis present

## 2012-09-08 DIAGNOSIS — R0602 Shortness of breath: Secondary | ICD-10-CM | POA: Insufficient documentation

## 2012-09-08 DIAGNOSIS — I4821 Permanent atrial fibrillation: Secondary | ICD-10-CM | POA: Diagnosis present

## 2012-09-08 DIAGNOSIS — I509 Heart failure, unspecified: Secondary | ICD-10-CM | POA: Insufficient documentation

## 2012-09-08 DIAGNOSIS — I249 Acute ischemic heart disease, unspecified: Secondary | ICD-10-CM

## 2012-09-08 DIAGNOSIS — I270 Primary pulmonary hypertension: Secondary | ICD-10-CM | POA: Insufficient documentation

## 2012-09-08 DIAGNOSIS — I200 Unstable angina: Secondary | ICD-10-CM | POA: Insufficient documentation

## 2012-09-08 DIAGNOSIS — Z7901 Long term (current) use of anticoagulants: Secondary | ICD-10-CM

## 2012-09-08 DIAGNOSIS — N183 Chronic kidney disease, stage 3 unspecified: Secondary | ICD-10-CM | POA: Insufficient documentation

## 2012-09-08 DIAGNOSIS — E7800 Pure hypercholesterolemia, unspecified: Secondary | ICD-10-CM | POA: Insufficient documentation

## 2012-09-08 DIAGNOSIS — I350 Nonrheumatic aortic (valve) stenosis: Secondary | ICD-10-CM

## 2012-09-08 DIAGNOSIS — Z79899 Other long term (current) drug therapy: Secondary | ICD-10-CM | POA: Insufficient documentation

## 2012-09-08 DIAGNOSIS — I5032 Chronic diastolic (congestive) heart failure: Secondary | ICD-10-CM | POA: Diagnosis present

## 2012-09-08 LAB — CBC WITH DIFFERENTIAL/PLATELET
HCT: 31.4 % — ABNORMAL LOW (ref 36.0–46.0)
Hemoglobin: 10.3 g/dL — ABNORMAL LOW (ref 12.0–15.0)
Lymphocytes Relative: 23 % (ref 12–46)
Lymphs Abs: 0.9 10*3/uL (ref 0.7–4.0)
Monocytes Absolute: 0.4 10*3/uL (ref 0.1–1.0)
Monocytes Relative: 9 % (ref 3–12)
Neutro Abs: 2.6 10*3/uL (ref 1.7–7.7)
WBC: 4.1 10*3/uL (ref 4.0–10.5)

## 2012-09-08 LAB — COMPREHENSIVE METABOLIC PANEL
AST: 47 U/L — ABNORMAL HIGH (ref 0–37)
BUN: 33 mg/dL — ABNORMAL HIGH (ref 6–23)
CO2: 24 mEq/L (ref 19–32)
Chloride: 108 mEq/L (ref 96–112)
Creatinine, Ser: 1.5 mg/dL — ABNORMAL HIGH (ref 0.50–1.10)
GFR calc non Af Amer: 29 mL/min — ABNORMAL LOW (ref >90)
Total Bilirubin: 1.4 mg/dL — ABNORMAL HIGH (ref 0.3–1.2)

## 2012-09-08 LAB — PRO B NATRIURETIC PEPTIDE: Pro B Natriuretic peptide (BNP): 8879 pg/mL — ABNORMAL HIGH (ref 0–450)

## 2012-09-08 LAB — TROPONIN I: Troponin I: <0.3 ng/mL (ref <0.30)

## 2012-09-08 MED ORDER — NITROGLYCERIN 0.4 MG SL SUBL: 0.4000 mg | SUBLINGUAL_TABLET | SUBLINGUAL | Status: DC | PRN

## 2012-09-08 MED ORDER — DICLOFENAC EPOLAMINE 1.3 % TD PTCH: 1.0000 | MEDICATED_PATCH | Freq: Every day | TRANSDERMAL | Status: DC

## 2012-09-08 MED ORDER — DILTIAZEM HCL 100 MG IV SOLR: 5.0000 mg/h | INTRAVENOUS | Status: DC

## 2012-09-08 MED ORDER — NITROGLYCERIN 0.4 MG SL SUBL
0.4000 mg | SUBLINGUAL_TABLET | SUBLINGUAL | Status: DC | PRN
  Administered 2012-09-08 (×2): 0.4 mg via SUBLINGUAL
  Filled 2012-09-08: qty 25

## 2012-09-08 NOTE — ED Provider Notes (Addendum)
History     CSN: [id number]  Arrival date & time 09/08/12  0909   First MD Initiated Contact with Patient 09/08/12 [phone]      Chief Complaint  Patient presents with  . Chest Pain    (Consider location/radiation/quality/duration/timing/severity/associated sxs/prior treatment) The history is provided by the patient.  [name] is a 79 y.o. female history of A. fib on Coumadin, CHF, CKD, CAD status post stents and CABG here presenting with chest pain. Intermittent gnawing chest pain for the last 3 days. Since yesterday the pain got worse and his left-sided and felt achy. She has some shortness of breath when she walks but that is baseline for her. No fevers or chills or cough. No worsening of her leg swelling. She took some nitroglycerin at home but still had some pain. She is allergic to aspirin so she didn't take it.    Past Medical History  Diagnosis Date  . CHF (congestive heart failure)     EF 30-35% on 2D Echo in July 2012  . High cholesterol   . Atrial fibrillation     rate controlled, on chronic coumadin therapy  . Sick sinus syndrome     s/p pacemaker  . Chronic kidney disease (CKD), stage III (moderate)   . Anemia     baseline 7.8-9.0  . Coronary artery disease     stent in 2008 shows CABG to LAD, RCA and circumflex with 100% occlusion  . Heart murmur     Past Surgical History  Procedure Laterality Date  . Exploratory laparotomy w/ bowel resection  2/12    in setting of SBO  . Pacemaker insertion  03/2009  . R breast lumpectomy  05/2004  . Ureterolithotomy  01/2002  . Insert / replace / remove pacemaker      No family history on file.  History  Substance Use Topics  . Smoking status: Never Smoker   . Smokeless tobacco: Never Used  . Alcohol Use: No    OB History   Grav Para Term Preterm Abortions TAB SAB Ect Mult Living                  Review of Systems  Respiratory: Positive for shortness of breath.   Cardiovascular: Positive for chest pain.   All other systems reviewed and are negative.    Allergies  Ace inhibitors; Food; Nutritional supplements; Penicillins; and Aspirin  Home Medications   Current Outpatient Rx  Name  Route  Sig  Dispense  Refill  . carvedilol (COREG) 3.125 MG tablet   Oral   Take 3.125 mg by mouth 2 (two) times daily with a meal.           . donepezil (ARICEPT) 5 MG tablet   Oral   Take 5 mg by mouth at bedtime.         Marland Kitchen ezetimibe (ZETIA) 10 MG tablet   Oral   Take 10 mg by mouth daily.           . nitroGLYCERIN (NITROSTAT) 0.4 MG SL tablet   Sublingual   Place 0.4 mg under the tongue every 5 (five) minutes x 3 doses as needed. For chest pain          . pantoprazole (PROTONIX) 40 MG tablet   Oral   Take 40 mg by mouth 2 (two) times daily.           . potassium chloride SA (K-DUR,KLOR-CON) 20 MEQ tablet   Oral  Take 20 mEq by mouth daily.           . valsartan (DIOVAN) 40 MG tablet   Oral   Take 40 mg by mouth daily.           Marland Kitchen warfarin (COUMADIN) 5 MG tablet   Oral   Take 2.5 mg by mouth daily.         . digoxin (LANOXIN) 0.125 MG tablet   Oral   Take 62.5 mcg by mouth daily.             BP 134/76  Pulse 59  Temp(Src) 97.2 F (36.2 C) (Oral)  Resp 18  SpO2 98%  Physical Exam  Nursing note and vitals reviewed. Constitutional: She is oriented to person, place, and time. She appears well-developed and well-nourished.  Slightly uncomfortable   HENT:  Head: Normocephalic.  Mouth/Throat: Oropharynx is clear and moist.  Eyes: Conjunctivae are normal. Pupils are equal, round, and reactive to light.  Neck: Normal range of motion. Neck supple.  Cardiovascular: Normal rate, regular rhythm and normal heart sounds.   Pulmonary/Chest: Effort normal.  Minimal crackles bilateral bases   Abdominal: Soft. Bowel sounds are normal. She exhibits no distension. There is no tenderness. There is no rebound.  Musculoskeletal: Normal range of motion.  1+ edema bilateral  legs, no calf tenderness   Neurological: She is alert and oriented to person, place, and time.  Skin: Skin is warm and dry.  Psychiatric: She has a normal mood and affect. Her behavior is normal. Judgment and thought content normal.    ED Course  Procedures (including critical care time)  Labs Reviewed  CBC WITH DIFFERENTIAL - Abnormal; Notable for the following:    RBC 3.21 (*)    Hemoglobin 10.3 (*)    HCT 31.4 (*)    RDW 15.7 (*)    Platelets 134 (*)    All other components within normal limits  COMPREHENSIVE METABOLIC PANEL - Abnormal; Notable for the following:    Glucose, Bld 107 (*)    BUN 33 (*)    Creatinine, Ser 1.50 (*)    AST 47 (*)    Alkaline Phosphatase 189 (*)    Total Bilirubin 1.4 (*)    GFR calc non Af Amer 29 (*)    GFR calc Af Amer 34 (*)    All other components within normal limits  PRO B NATRIURETIC PEPTIDE - Abnormal; Notable for the following:    Pro B Natriuretic peptide (BNP) 8879.0 (*)    All other components within normal limits  PROTIME-INR - Abnormal; Notable for the following:    Prothrombin Time 16.2 (*)    All other components within normal limits  DIGOXIN LEVEL - Abnormal; Notable for the following:    Digoxin Level <0.3 (*)    All other components within normal limits  TROPONIN I   Dg Chest 2 View  09/08/2012  *RADIOLOGY REPORT*  Clinical Data: Cough, congestion and chest pain.  CHEST - 2 VIEW  Comparison: Chest x-ray 06/13/2012.  Findings: Status post median sternotomy.  Left-sided pacemaker device in place with lead tip projecting over the expected location of the right ventricular apex.  The lung volumes are normal.  There are patchy areas of mild interstitial prominence throughout the lungs bilaterally, most pronounced in the periphery of the lower lungs.  Minimal blunting of the costophrenic sulci bilaterally suggesting trace bilateral pleural effusions.  Pulmonary vasculature is within normal limits.  Heart size is mildly enlarged  (  unchanged).  Atherosclerosis in the thoracic aorta. Orthopedic fixation hardware in the lower lumbar spine incompletely visualized.  IMPRESSION: 1.  Patchy areas of mild interstitial prominence, most pronounced at the lung bases bilaterally.  Findings are nonspecific, but do not appear to represent pulmonary edema.  This could represent early atypical infection.  Clinical correlation is recommended. 2.  Probable trace bilateral pleural effusions. 3.  Mild cardiomegaly is unchanged. 4.  Atherosclerosis. 5.  Postoperative changes and support apparatus, as above.   Original Report Authenticated By: Trudie Reed, M.D.      No diagnosis found.   Date: 09/08/2012  Rate: 60  Rhythm: V paced  QRS Axis: normal  Intervals: normal  ST/T Wave abnormalities: nonspecific ST changes  Conduction Disutrbances:none  Narrative Interpretation: Possible underlying afib, Poor baseline   Old EKG Reviewed: unchanged    MDM  AYLLA HUFFINE is a 79 y.o. female hx of CAD, CHF, afib here with chest pain. Concerning for ACS. Will get trop, labs, CXR. Will likely need admission.   12 PM Labs showed Cr at baseline, BNP dec from before. Pain free now. Trop neg x 1. I called [facility] cardiology to admit the patient.   3:30 PM Dr. Rennis Golden saw the patient. He felt that its not cardiac. Recommend flector patch for pain control. Didn't want second troponin. Will d/c home. She will f/u with him outpatient.       Richardean Canal, MD 09/08/12 1316  Richardean Canal, MD 09/08/12 [phone]

## 2012-09-08 NOTE — ED Notes (Signed)
PATIENT STATES SHE HAS PAIN TO THE LEFT LATERAL CHEST WALL IN THE AREA OF HER BREAST. STATES IS A SMALL AREA OF PAIN (SIZE OF A QUARTER). STATES ONSET Saturday. STATES DOES NOT CHANGE WITH MOVEMENT/BREATHING OR PALPATION. LUNGS CLEAR

## 2012-09-08 NOTE — ED Notes (Signed)
[facility] called to move pt, will called back to get reports because they're getting another pt right now.

## 2012-09-08 NOTE — Consult Note (Signed)
Pt. Seen and examined. Agree with the NP/PA-C note as written.  79 yo female with history of CABG, severe MR and aortic stenosis, pulmonary HTN and atrial fibrillation s/p PPM. She presents with focal chest pain which is sharp, pinpoint and stabbing in her left anterior axillary line under the left breast, non-pleuritic - present constantly since Saturday. No alleviating or aggrevating factors. EKG is paced with underlying a-fib. Troponin is negative. BNP elevated, but below last admission.  Creatinine stable at 1.5. No overt congestive symptoms and according to Dr. Renaye Rakers last office note, accepting a slightly higher BNP (less lasix) to avoid syncope and falls.  INR is subtherapeutic and this has been a problem for a while (followed by her PCP).  Impression: 1.  Non-cardiac chest pain 2.  Mild CHF without over symptoms. 3.  Severe AS/MR 4.  CABG 5.  A-fib with PPMs  Plan: 1.  This does not seem to be anginal pain.  It is sore in the chest wall, but not necessarily worse with palpation. No improvement with nitrates. I would recommend a short course of NSAID therapy to see if it improves or even a topical Flector patch applied to the area of the chest wall.  Can follow-up with the midlevel providers in our office.    Chrystie Nose, MD, [facility] Attending Cardiologist [facility]

## 2012-09-08 NOTE — ED Notes (Signed)
Patient transported to X-ray,  Family is at the bedside

## 2012-09-08 NOTE — ED Notes (Signed)
Onset 2 days ago chest pain took own nitro yesterday with relief and today with no relief. Pain currently 5/10 achy pressure.

## 2012-09-08 NOTE — Consult Note (Signed)
Reason for Consult:  Chest Pain Referring Physician: [place] [facility]   HPI:  The patient is a 79 y/o female with an extensive history of cardiac problems, and has been followed in our office by Dr. Elsie Lincoln, then by Dr. Royann Shivers. Her history includes severe mitral insufficiency, severe aortic stenosis, moderate pulmonary hypertension, and coronary artery disease. She is status post previous bypass surgery, in the 1980's, (graft-dependent with totally occluded native coronary arteries, occluded saphenous vein graft to the right coronary artery, patent sequential vein graft to the LAD diagonal, OM1 and OM2). These findings are from her last cath in 2008, by Dr. Elsie Lincoln. Since then, she has been treated medically for her CAD.  She also has CHF, and atrial fibrillation. She is on chronic anticoagulation therapy with warfarin. She also has a pacemaker Customer service manager). She apparently had SSS in the past. The device was interrogated during her last office visit, with Dr. Royann Shivers in January of this year, and was functioning normally.   She presented to the [facility] ED this morning with a complaint of CP. The pain first started 2 days ago. It is unlike any chest pain she has had before. It is a localized, sharp, stabbing pain. It is located to the lateral aspect of her left breast. She denies substernal pain/pressure. It does not radiate. It has been constant. Max intensity of 6/10. No aggravating or relieving factors. It is non-pleuritic. She denies associated SOB, diaphoresis, n/v, palpitations, lightheadedness/dizziness, syncope/presyncope, orthopnea/PND and LEE. She has had cold-like symptoms off and on for the last few months, which has included cough, but nothing significant. She sustained an atraumatic fall at home ~ 3 weeks ago, but denies further injury or trauma. She reports taking 1 SL NTG at home and noted no improvement. In the ED she was given 2 SL NTG with mild improvement. The pain remains and is now 4/10. She  reports compliance with all her home medications. She states that she does not take ASA because she has an ASA allergy.   Past Medical History  Diagnosis Date  . CHF (congestive heart failure)     EF 30-35% on 2D Echo in July 2012  . High cholesterol   . Atrial fibrillation     rate controlled, on chronic coumadin therapy  . Sick sinus syndrome     s/p pacemaker  . Chronic kidney disease (CKD), stage III (moderate)   . Anemia     baseline 7.8-9.0  . Coronary artery disease     stent in 2008 shows CABG to LAD, RCA and circumflex with 100% occlusion  . Heart murmur     Past Surgical History  Procedure Laterality Date  . Exploratory laparotomy w/ bowel resection  2/12    in setting of SBO  . Pacemaker insertion  03/2009  . R breast lumpectomy  05/2004  . Ureterolithotomy  01/2002  . Insert / replace / remove pacemaker      No family history on file.  Social History:  reports that she has never smoked. She has never used smokeless tobacco. She reports that she does not drink alcohol or use illicit drugs.  Allergies:  Allergies  Allergen Reactions  . Ace Inhibitors Cough  . Food Other (See Comments)    Berries,tomato  . Nutritional Supplements Other (See Comments)    Unknown.  Marland Kitchen Penicillins Swelling  . Aspirin Rash and Other (See Comments)    GI distress    Medications: Prior to Admission:  (Not in a hospital  admission)  Results for orders placed during the hospital encounter of 09/08/12 (from the past 48 hour(s))  CBC WITH DIFFERENTIAL     Status: Abnormal   Collection Time    09/08/12  9:42 AM      Result Value Range   WBC 4.1  4.0 - 10.5 K/uL   RBC 3.21 (*) 3.87 - 5.11 MIL/uL   Hemoglobin 10.3 (*) 12.0 - 15.0 g/dL   HCT 96.0 (*) 45.4 - 09.8 %   MCV 97.8  78.0 - 100.0 fL   MCH 32.1  26.0 - 34.0 pg   MCHC 32.8  30.0 - 36.0 g/dL   RDW 11.9 (*) 14.7 - 82.9 %   Platelets 134 (*) 150 - 400 K/uL   Neutrophils Relative 64  43 - 77 %   Neutro Abs 2.6  1.7 - 7.7 K/uL    Lymphocytes Relative 23  12 - 46 %   Lymphs Abs 0.9  0.7 - 4.0 K/uL   Monocytes Relative 9  3 - 12 %   Monocytes Absolute 0.4  0.1 - 1.0 K/uL   Eosinophils Relative 4  0 - 5 %   Eosinophils Absolute 0.2  0.0 - 0.7 K/uL   Basophils Relative 1  0 - 1 %   Basophils Absolute 0.0  0.0 - 0.1 K/uL  COMPREHENSIVE METABOLIC PANEL     Status: Abnormal   Collection Time    09/08/12  9:42 AM      Result Value Range   Sodium 142  135 - 145 mEq/L   Potassium 5.0  3.5 - 5.1 mEq/L   Chloride 108  96 - 112 mEq/L   CO2 24  19 - 32 mEq/L   Glucose, Bld 107 (*) 70 - 99 mg/dL   BUN 33 (*) 6 - 23 mg/dL   Creatinine, Ser 5.62 (*) 0.50 - 1.10 mg/dL   Calcium 9.4  8.4 - 13.0 mg/dL   Total Protein 6.7  6.0 - 8.3 g/dL   Albumin 3.8  3.5 - 5.2 g/dL   AST 47 (*) 0 - 37 U/L   ALT 34  0 - 35 U/L   Alkaline Phosphatase 189 (*) 39 - 117 U/L   Total Bilirubin 1.4 (*) 0.3 - 1.2 mg/dL   GFR calc non Af Amer 29 (*) >90 mL/min   GFR calc Af Amer 34 (*) >90 mL/min   Comment:            The eGFR has been calculated     using the CKD EPI equation.     This calculation has not been     validated in all clinical     situations.     eGFR's persistently     <90 mL/min signify     possible Chronic Kidney Disease.  TROPONIN I     Status: None   Collection Time    09/08/12  9:43 AM      Result Value Range   Troponin I <0.30  <0.30 ng/mL   Comment:            Due to the release kinetics of cTnI,     a negative result within the first hours     of the onset of symptoms does not rule out     myocardial infarction with certainty.     If myocardial infarction is still suspected,     repeat the test at appropriate intervals.  PRO B NATRIURETIC PEPTIDE     Status: Abnormal  Collection Time    09/08/12  9:43 AM      Result Value Range   Pro B Natriuretic peptide (BNP) 8879.0 (*) 0 - 450 pg/mL  PROTIME-INR     Status: Abnormal   Collection Time    09/08/12  9:43 AM      Result Value Range   Prothrombin Time  16.2 (*) 11.6 - 15.2 seconds   INR 1.33  0.00 - 1.49  DIGOXIN LEVEL     Status: Abnormal   Collection Time    09/08/12  9:43 AM      Result Value Range   Digoxin Level <0.3 (*) 0.8 - 2.0 ng/mL    Dg Chest 2 View  09/08/2012  *RADIOLOGY REPORT*  Clinical Data: Cough, congestion and chest pain.  CHEST - 2 VIEW  Comparison: Chest x-ray 06/13/2012.  Findings: Status post median sternotomy.  Left-sided pacemaker device in place with lead tip projecting over the expected location of the right ventricular apex.  The lung volumes are normal.  There are patchy areas of mild interstitial prominence throughout the lungs bilaterally, most pronounced in the periphery of the lower lungs.  Minimal blunting of the costophrenic sulci bilaterally suggesting trace bilateral pleural effusions.  Pulmonary vasculature is within normal limits.  Heart size is mildly enlarged (unchanged).  Atherosclerosis in the thoracic aorta. Orthopedic fixation hardware in the lower lumbar spine incompletely visualized.  IMPRESSION: 1.  Patchy areas of mild interstitial prominence, most pronounced at the lung bases bilaterally.  Findings are nonspecific, but do not appear to represent pulmonary edema.  This could represent early atypical infection.  Clinical correlation is recommended. 2.  Probable trace bilateral pleural effusions. 3.  Mild cardiomegaly is unchanged. 4.  Atherosclerosis. 5.  Postoperative changes and support apparatus, as above.   Original Report Authenticated By: Trudie Reed, M.D.     Review of Systems  Constitutional: Negative for malaise/fatigue and diaphoresis.  HENT: Positive for congestion. Negative for neck pain.   Respiratory: Positive for cough. Negative for sputum production, shortness of breath and wheezing.   Cardiovascular: Positive for chest pain. Negative for palpitations, orthopnea, claudication, leg swelling and PND.  Gastrointestinal: Negative for nausea, vomiting, abdominal pain, diarrhea,  constipation, blood in stool and melena.  Musculoskeletal: Positive for falls. Negative for back pain.  Neurological: Negative for dizziness and loss of consciousness.   Blood pressure 134/76, pulse 59, temperature 97.2 F (36.2 C), temperature source Oral, resp. rate 18, SpO2 98.00%. Physical Exam  Constitutional: She is oriented to person, place, and time. She appears well-developed and well-nourished. No distress.  HENT:  Head: Normocephalic and atraumatic.  The patient is hard of hearing  Eyes: Conjunctivae and EOM are normal. Pupils are equal, round, and reactive to light.  Neck: Normal range of motion. No JVD present. No thyromegaly present.  Cardiovascular: Intact distal pulses.  An irregularly irregular rhythm present. Exam reveals no gallop and no friction rub.   Murmur heard.  Systolic murmur is present with a grade of 3/6  Pulses:      Carotid pulses are on the right side with bruit, and on the left side with bruit.      Radial pulses are 2+ on the right side, and 2+ on the left side.       Dorsalis pedis pulses are 1+ on the right side, and 1+ on the left side.  There is radiation of systolic AS murmur to carotids bilaterally.  Respiratory: Effort normal and breath sounds normal. No respiratory  distress. She has no wheezes. She has no rales. She exhibits no tenderness.  GI: Soft. Bowel sounds are normal. She exhibits no distension and no mass. There is no tenderness.  Musculoskeletal: She exhibits no edema.  Neurological: She is alert and oriented to person, place, and time.  Skin: Skin is warm and dry. She is not diaphoretic.  Psychiatric: She has a normal mood and affect. Her behavior is normal.    Assessment/Plan: Principal Problem:   Chest pain Active Problems:   Coronary artery disease - S/P remote CABG x 4 (1980's)   Atrial fibrillation   Chronic diastolic heart failure   Chronic anticoagulation - on Warfarin   Aortic stenosis   Mitral insufficiency    Pacemaker - implanted for SSS ([employer])  Plan: Initial EKG shows ventricular pacing. No acute changes. Troponin negative x 1. BNP is elevated at 8,879. Vitals are stable. O2 sats of 98% on RA. CXR shows mild cardiomegaly, that is unchanged from prior study, and probable trace bilateral pleural effusions. Pt has had no significant improvement with SL NTG. The pain is not like her angina in the past. ? If it is cardiac related. However, due to past cardiac history, will continue to monitor and cycle enzymes. Also the patient reports ASA allergy. ? If she needs to be on Plavix instead. MD to follow and to provide recommendation.   SIMMONS, BRITTAINY 09/08/2012, 1:38 PM

## 2012-10-30 ENCOUNTER — Encounter: Payer: Self-pay | Admitting: *Deleted

## 2012-10-31 ENCOUNTER — Encounter: Payer: Self-pay | Admitting: Cardiovascular Disease

## 2013-02-12 ENCOUNTER — Ambulatory Visit (INDEPENDENT_AMBULATORY_CARE_PROVIDER_SITE_OTHER): Payer: Medicare Other | Admitting: Cardiology

## 2013-02-12 ENCOUNTER — Encounter: Payer: Self-pay | Admitting: Cardiology

## 2013-02-12 VITALS — BP 130/60 | HR 66 | Ht 66.0 in | Wt 124.2 lb

## 2013-02-12 DIAGNOSIS — I2510 Atherosclerotic heart disease of native coronary artery without angina pectoris: Secondary | ICD-10-CM

## 2013-02-12 DIAGNOSIS — H9193 Unspecified hearing loss, bilateral: Secondary | ICD-10-CM

## 2013-02-12 DIAGNOSIS — I359 Nonrheumatic aortic valve disorder, unspecified: Secondary | ICD-10-CM

## 2013-02-12 DIAGNOSIS — Z7901 Long term (current) use of anticoagulants: Secondary | ICD-10-CM

## 2013-02-12 DIAGNOSIS — I350 Nonrheumatic aortic (valve) stenosis: Secondary | ICD-10-CM

## 2013-02-12 DIAGNOSIS — I5032 Chronic diastolic (congestive) heart failure: Secondary | ICD-10-CM

## 2013-02-12 DIAGNOSIS — Z950 Presence of cardiac pacemaker: Secondary | ICD-10-CM

## 2013-02-12 DIAGNOSIS — H9190 Unspecified hearing loss, unspecified ear: Secondary | ICD-10-CM | POA: Insufficient documentation

## 2013-02-12 DIAGNOSIS — N183 Chronic kidney disease, stage 3 unspecified: Secondary | ICD-10-CM

## 2013-02-12 NOTE — Assessment & Plan Note (Signed)
paced

## 2013-02-12 NOTE — Assessment & Plan Note (Signed)
Some DOE but no CHF on exam

## 2013-02-12 NOTE — Patient Instructions (Addendum)
Stop Lanoxin Your physician recommends that you have lab work today Your physician recommends that you schedule a follow-up appointment in: 3 months with Dr Royann Shivers

## 2013-02-12 NOTE — Assessment & Plan Note (Signed)
Severe by 2D 2012

## 2013-02-12 NOTE — Progress Notes (Signed)
02/12/2013 [name]   17-Feb-1918  [id number]  Primary Physicia Dr Celene Skeen Primary Cardiologist: Dr Royann Shivers  HPI:  Remarkable 79 y/o who still drives, presents to the office today for 3 month follow up. She has multiple cardiac issues including CAF on Coumadin, S/P PTVDP, remote CABG, severe AS, CRI, HOH, and ICM with an EF in the 40% range. She has had acute mixed CHF in the past, last admitted in February 2014. She lives alone, "just me and my dog". She does say she has exertional fatigue. She denies any falls. Her primary care MD follows her INR.   Current Outpatient Prescriptions  Medication Sig Dispense Refill  . carvedilol (COREG) 3.125 MG tablet Take 3.125 mg by mouth 2 (two) times daily with a meal.        . diclofenac (FLECTOR) 1.3 % PTCH Place 1 patch onto the skin daily.  5 patch  0  . donepezil (ARICEPT) 5 MG tablet Take 5 mg by mouth at bedtime.      Marland Kitchen ezetimibe (ZETIA) 10 MG tablet Take 10 mg by mouth daily.        . furosemide (LASIX) 40 MG tablet Take 40 mg by mouth daily. Take 1/2 tablet daily      . nitroGLYCERIN (NITROSTAT) 0.4 MG SL tablet Place 1 tablet (0.4 mg total) under the tongue every 5 (five) minutes as needed for chest pain.  20 tablet  0  . pantoprazole (PROTONIX) 40 MG tablet Take 40 mg by mouth 2 (two) times daily.        . potassium chloride SA (K-DUR,KLOR-CON) 20 MEQ tablet Take 20 mEq by mouth daily.        Marland Kitchen warfarin (COUMADIN) 5 MG tablet Take 2.5 mg by mouth daily.       No current facility-administered medications for this visit.    Allergies  Allergen Reactions  . Ace Inhibitors Cough  . Food Other (See Comments)    Berries,tomato  . Nutritional Supplements Other (See Comments)    Unknown.  Marland Kitchen Penicillins Swelling  . Simvastatin Other (See Comments)    Myalgia   . Amlodipine Besylate Rash  . Aspirin Rash and Other (See Comments)    GI distress    History   Social History  . Marital Status: Single    Spouse Name: N/A    Number of  Children: N/A  . Years of Education: N/A   Occupational History  . Not on file.   Social History Main Topics  . Smoking status: Never Smoker   . Smokeless tobacco: Never Used  . Alcohol Use: No  . Drug Use: No  . Sexually Active: No   Other Topics Concern  . 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  Cardiovascular: negative for chest pain, dyspnea on exertion, edema, orthopnea, palpitations, paroxysmal nocturnal dyspnea or shortness of breath Dermatological: negative for rash Respiratory: negative for cough or wheezing Urologic: negative for hematuria Abdominal: negative for nausea, vomiting, diarrhea, bright red blood per rectum, melena, or hematemesis Neurologic: negative for visual changes, syncope, or dizziness All other systems reviewed and are otherwise negative except as noted above.    Blood pressure 130/60, pulse 66, height 5\' 6"  (1.676 m), weight 124 lb 3.2 oz (56.337 kg).  General appearance: alert, cooperative, no distress and thin Lungs:  few basilar dry crackles Heart: regular rate and rhythm and 2/6 systolic murmur Extremities: no edema  EKG  EKG: paced.  ASSESSMENT AND PLAN:   Chronic diastolic heart failure Some DOE but no CHF on exam  Coronary artery disease - S/P remote CABG x 4 (1980's) No angina  Aortic stenosis Severe by 2D 2012  Chronic renal insufficiency, stage III (moderate) Last SCr in Feb 1.5  Pacemaker - implanted for SSS ([employer]) paced  Chronic anticoagulation - on Warfarin Followed  By her primary MD  HOH (hard of hearing) .   PLAN  Check BMP. I thought it might be safer for her to stop her Lanoxin. Her rate is controlled, (paced), and her last SCr was up to 1.5 with a K+ of 5.0. I ordered a BMP today. F/U 3 months with Dr Royann Shivers.    Marwan Lipe  KPA-C 02/12/2013 11:01 AM

## 2013-02-12 NOTE — Assessment & Plan Note (Signed)
Followed  By her primary MD

## 2013-02-12 NOTE — Assessment & Plan Note (Signed)
No angina 

## 2013-02-12 NOTE — Assessment & Plan Note (Signed)
Last SCr in Feb 1.5

## 2013-02-13 LAB — BASIC METABOLIC PANEL
BUN: 38 mg/dL — ABNORMAL HIGH (ref 6–23)
CO2: 23 mEq/L (ref 19–32)
Calcium: 9.6 mg/dL (ref 8.4–10.5)
Chloride: 110 mEq/L (ref 96–112)
Creat: 2.05 mg/dL — ABNORMAL HIGH (ref 0.50–1.10)
Glucose, Bld: 91 mg/dL (ref 70–99)
Potassium: 5.8 mEq/L — ABNORMAL HIGH (ref 3.5–5.3)
Sodium: 142 mEq/L (ref 135–145)

## 2013-03-10 ENCOUNTER — Telehealth: Payer: Self-pay | Admitting: *Deleted

## 2013-03-10 DIAGNOSIS — Z79899 Other long term (current) drug therapy: Secondary | ICD-10-CM

## 2013-03-10 NOTE — Telephone Encounter (Signed)
Message copied by Vita Barley on Tue Mar 10, 2013  9:56 AM ------      Message from: Abelino Derrick      Created: Wed Feb 18, 2013  1:57 PM       Stop Potasium supplement. BMP in 2wks            LUKE [facility] PA-C      02/18/2013      1:57 PM       ------

## 2013-03-25 LAB — BASIC METABOLIC PANEL
BUN: 36 mg/dL — ABNORMAL HIGH (ref 6–23)
CO2: 22 mEq/L (ref 19–32)
Calcium: 9.4 mg/dL (ref 8.4–10.5)
Creat: 1.61 mg/dL — ABNORMAL HIGH (ref 0.50–1.10)

## 2013-04-09 ENCOUNTER — Encounter (HOSPITAL_COMMUNITY): Payer: Self-pay | Admitting: Emergency Medicine

## 2013-04-09 ENCOUNTER — Inpatient Hospital Stay (HOSPITAL_COMMUNITY)
Admission: EM | Admit: 2013-04-09 | Discharge: 2013-04-16 | DRG: 293 | Disposition: A | Discharge status: Home Health | Payer: Medicare Other | Attending: Cardiovascular Disease | Admitting: Cardiovascular Disease

## 2013-04-09 ENCOUNTER — Emergency Department (HOSPITAL_COMMUNITY): Payer: Medicare Other

## 2013-04-09 DIAGNOSIS — I2510 Atherosclerotic heart disease of native coronary artery without angina pectoris: Secondary | ICD-10-CM | POA: Diagnosis present

## 2013-04-09 DIAGNOSIS — I4821 Permanent atrial fibrillation: Secondary | ICD-10-CM | POA: Diagnosis present

## 2013-04-09 DIAGNOSIS — I495 Sick sinus syndrome: Secondary | ICD-10-CM

## 2013-04-09 DIAGNOSIS — Z7901 Long term (current) use of anticoagulants: Secondary | ICD-10-CM

## 2013-04-09 DIAGNOSIS — R259 Unspecified abnormal involuntary movements: Secondary | ICD-10-CM | POA: Diagnosis present

## 2013-04-09 DIAGNOSIS — I340 Nonrheumatic mitral (valve) insufficiency: Secondary | ICD-10-CM

## 2013-04-09 DIAGNOSIS — I5032 Chronic diastolic (congestive) heart failure: Secondary | ICD-10-CM | POA: Diagnosis present

## 2013-04-09 DIAGNOSIS — I2589 Other forms of chronic ischemic heart disease: Secondary | ICD-10-CM | POA: Diagnosis present

## 2013-04-09 DIAGNOSIS — Z950 Presence of cardiac pacemaker: Secondary | ICD-10-CM | POA: Diagnosis present

## 2013-04-09 DIAGNOSIS — I080 Rheumatic disorders of both mitral and aortic valves: Secondary | ICD-10-CM | POA: Diagnosis present

## 2013-04-09 DIAGNOSIS — R079 Chest pain, unspecified: Secondary | ICD-10-CM

## 2013-04-09 DIAGNOSIS — I4891 Unspecified atrial fibrillation: Secondary | ICD-10-CM

## 2013-04-09 DIAGNOSIS — I359 Nonrheumatic aortic valve disorder, unspecified: Secondary | ICD-10-CM

## 2013-04-09 DIAGNOSIS — H9190 Unspecified hearing loss, unspecified ear: Secondary | ICD-10-CM | POA: Diagnosis present

## 2013-04-09 DIAGNOSIS — I350 Nonrheumatic aortic (valve) stenosis: Secondary | ICD-10-CM | POA: Diagnosis present

## 2013-04-09 DIAGNOSIS — R0902 Hypoxemia: Secondary | ICD-10-CM | POA: Diagnosis present

## 2013-04-09 DIAGNOSIS — I509 Heart failure, unspecified: Secondary | ICD-10-CM

## 2013-04-09 DIAGNOSIS — I5033 Acute on chronic diastolic (congestive) heart failure: Principal | ICD-10-CM | POA: Diagnosis present

## 2013-04-09 DIAGNOSIS — Z951 Presence of aortocoronary bypass graft: Secondary | ICD-10-CM

## 2013-04-09 DIAGNOSIS — Z9861 Coronary angioplasty status: Secondary | ICD-10-CM

## 2013-04-09 DIAGNOSIS — H9193 Unspecified hearing loss, bilateral: Secondary | ICD-10-CM

## 2013-04-09 DIAGNOSIS — I472 Ventricular tachycardia: Secondary | ICD-10-CM

## 2013-04-09 DIAGNOSIS — D649 Anemia, unspecified: Secondary | ICD-10-CM | POA: Diagnosis present

## 2013-04-09 DIAGNOSIS — N183 Chronic kidney disease, stage 3 unspecified: Secondary | ICD-10-CM | POA: Diagnosis present

## 2013-04-09 DIAGNOSIS — I2789 Other specified pulmonary heart diseases: Secondary | ICD-10-CM | POA: Diagnosis present

## 2013-04-09 DIAGNOSIS — Z79899 Other long term (current) drug therapy: Secondary | ICD-10-CM

## 2013-04-09 LAB — URINALYSIS, ROUTINE W REFLEX MICROSCOPIC
Bilirubin Urine: NEGATIVE
Hgb urine dipstick: NEGATIVE
Nitrite: NEGATIVE
Specific Gravity, Urine: 1.01 (ref 1.005–1.030)
pH: 5 (ref 5.0–8.0)

## 2013-04-09 LAB — CBC WITH DIFFERENTIAL/PLATELET
Basophils Absolute: 0.1 10*3/uL (ref 0.0–0.1)
Basophils Relative: 1 % (ref 0–1)
Hemoglobin: 9.4 g/dL — ABNORMAL LOW (ref 12.0–15.0)
MCHC: 32.6 g/dL (ref 30.0–36.0)
Monocytes Relative: 10 % (ref 3–12)
Neutro Abs: 2.5 10*3/uL (ref 1.7–7.7)
Neutrophils Relative %: 62 % (ref 43–77)
Platelets: 206 10*3/uL (ref 150–400)
RDW: 17.6 % — ABNORMAL HIGH (ref 11.5–15.5)

## 2013-04-09 LAB — RETICULOCYTES: RBC.: 3.05 MIL/uL — ABNORMAL LOW (ref 3.87–5.11)

## 2013-04-09 LAB — COMPREHENSIVE METABOLIC PANEL
ALT: 11 U/L (ref 0–35)
AST: 28 U/L (ref 0–37)
Albumin: 3.9 g/dL (ref 3.5–5.2)
Alkaline Phosphatase: 164 U/L — ABNORMAL HIGH (ref 39–117)
Chloride: 105 mEq/L (ref 96–112)
Potassium: 3.4 mEq/L — ABNORMAL LOW (ref 3.5–5.1)
Sodium: 141 mEq/L (ref 135–145)
Total Bilirubin: 2.3 mg/dL — ABNORMAL HIGH (ref 0.3–1.2)

## 2013-04-09 LAB — POCT I-STAT TROPONIN I: Troponin i, poc: 0.02 ng/mL (ref 0.00–0.08)

## 2013-04-09 LAB — IRON AND TIBC
Saturation Ratios: 22 % (ref 20–55)
UIBC: 234 ug/dL (ref 125–400)

## 2013-04-09 LAB — PROTIME-INR
INR: 2.22 — ABNORMAL HIGH (ref 0.00–1.49)
Prothrombin Time: 23.9 seconds — ABNORMAL HIGH (ref 11.6–15.2)

## 2013-04-09 MED ORDER — EZETIMIBE 10 MG PO TABS
10.0000 mg | ORAL_TABLET | Freq: Every day | ORAL | Status: DC
  Administered 2013-04-10 – 2013-04-16 (×7): 10 mg via ORAL
  Filled 2013-04-09 (×7): qty 1

## 2013-04-09 MED ORDER — NITROGLYCERIN 2 % TD OINT
1.0000 [in_us] | TOPICAL_OINTMENT | Freq: Four times a day (QID) | TRANSDERMAL | Status: DC
  Administered 2013-04-10 – 2013-04-13 (×14): 1 [in_us] via TOPICAL
  Filled 2013-04-09: qty 30

## 2013-04-09 MED ORDER — DONEPEZIL HCL 5 MG PO TABS
5.0000 mg | ORAL_TABLET | Freq: Every day | ORAL | Status: DC
  Administered 2013-04-09 – 2013-04-15 (×7): 5 mg via ORAL
  Filled 2013-04-09 (×8): qty 1

## 2013-04-09 MED ORDER — FUROSEMIDE 10 MG/ML IJ SOLN
20.0000 mg | Freq: Once | INTRAMUSCULAR | Status: AC
  Administered 2013-04-09: 20 mg via INTRAVENOUS
  Filled 2013-04-09: qty 2

## 2013-04-09 MED ORDER — NITROGLYCERIN 2 % TD OINT: 1.0000 [in_us] | TOPICAL_OINTMENT | Freq: Three times a day (TID) | TRANSDERMAL | Status: DC

## 2013-04-09 MED ORDER — PANTOPRAZOLE SODIUM 40 MG PO TBEC
40.0000 mg | DELAYED_RELEASE_TABLET | Freq: Every day | ORAL | Status: DC
  Administered 2013-04-09 – 2013-04-16 (×8): 40 mg via ORAL
  Filled 2013-04-09 (×6): qty 1

## 2013-04-09 MED ORDER — ONDANSETRON HCL 4 MG/2ML IJ SOLN: 4.0000 mg | Freq: Four times a day (QID) | INTRAMUSCULAR | Status: DC | PRN

## 2013-04-09 MED ORDER — SODIUM CHLORIDE 0.9 % IV SOLN: 250.0000 mL | INTRAVENOUS | Status: DC | PRN

## 2013-04-09 MED ORDER — FUROSEMIDE 10 MG/ML IJ SOLN
40.0000 mg | Freq: Once | INTRAMUSCULAR | Status: AC
  Administered 2013-04-09: 40 mg via INTRAVENOUS
  Filled 2013-04-09: qty 4

## 2013-04-09 MED ORDER — POTASSIUM CHLORIDE CRYS ER 20 MEQ PO TBCR
40.0000 meq | EXTENDED_RELEASE_TABLET | Freq: Once | ORAL | Status: AC
  Administered 2013-04-09: 40 meq via ORAL
  Filled 2013-04-09: qty 2

## 2013-04-09 MED ORDER — NITROGLYCERIN 0.4 MG SL SUBL: 0.4000 mg | SUBLINGUAL_TABLET | SUBLINGUAL | Status: DC | PRN

## 2013-04-09 MED ORDER — ALPRAZOLAM 0.25 MG PO TABS: 0.2500 mg | ORAL_TABLET | Freq: Two times a day (BID) | ORAL | Status: DC | PRN

## 2013-04-09 MED ORDER — WARFARIN SODIUM 2.5 MG PO TABS
2.5000 mg | ORAL_TABLET | Freq: Every day | ORAL | Status: DC
  Administered 2013-04-10 – 2013-04-14 (×5): 2.5 mg via ORAL
  Filled 2013-04-09 (×6): qty 1

## 2013-04-09 MED ORDER — SODIUM CHLORIDE 0.9 % IJ SOLN
3.0000 mL | Freq: Two times a day (BID) | INTRAMUSCULAR | Status: DC
  Administered 2013-04-09 – 2013-04-15 (×12): 3 mL via INTRAVENOUS

## 2013-04-09 MED ORDER — NITROGLYCERIN 2 % TD OINT
1.0000 [in_us] | TOPICAL_OINTMENT | Freq: Four times a day (QID) | TRANSDERMAL | Status: DC
  Administered 2013-04-09: 1 [in_us] via TOPICAL
  Filled 2013-04-09: qty 1

## 2013-04-09 MED ORDER — SODIUM CHLORIDE 0.9 % IJ SOLN: 3.0000 mL | INTRAMUSCULAR | Status: DC | PRN

## 2013-04-09 MED ORDER — WARFARIN SODIUM 2.5 MG PO TABS
2.5000 mg | ORAL_TABLET | Freq: Every day | ORAL | Status: DC
  Filled 2013-04-09: qty 1

## 2013-04-09 MED ORDER — ACETAMINOPHEN 325 MG PO TABS: 650.0000 mg | ORAL_TABLET | ORAL | Status: DC | PRN

## 2013-04-09 MED ORDER — WARFARIN - PHARMACIST DOSING INPATIENT
Freq: Every day | Status: DC
  Administered 2013-04-11 – 2013-04-12 (×2)

## 2013-04-09 NOTE — Progress Notes (Signed)
I have seen and examined the patient along with Nada Boozer, NP.  I have reviewed the chart, notes and new data.  I agree with NP's note.  She presents with worsening lower extremity edema and profound fatigue. She has clinical findings consistent with acute exacerbation of CHF.  She has moderately severe ischemic cardiomyopathy (CABG approximately 30 years ago), advanced aortic and mitral valvular disease and is being managed conservatively due to her age. By previous echo evaluation, her LV performance deteriorates substantially when she has V pacing rather than native AV conduction.   PLAN: Admit for IV diuretics with close monitoring of renal function (baseline GFR around 30 mL/min). Interrogate PM - try to adjust programming to avoid V pacing. Hold carvedilol, at least temporarily, maybe definitively. Avoid all drugs that could depress AV conduction. In past her "dry weight" on the office scale seemed optimized at around 114 lb.  Thurmon Fair, MD, [facility] [phone] 04/09/2013, 5:16 PM

## 2013-04-09 NOTE — ED Notes (Signed)
Page Laren Everts to [phone]

## 2013-04-09 NOTE — Progress Notes (Signed)
ANTICOAGULATION CONSULT NOTE - Initial Consult  Pharmacy Consult for Coumadin Indication: atrial fibrillation  Allergies  Allergen Reactions  . Ace Inhibitors Cough  . Food Other (See Comments)    Berries,tomato  . Nutritional Supplements Other (See Comments)    Unknown.  Marland Kitchen Penicillins Swelling  . Simvastatin Other (See Comments)    Myalgia   . Amlodipine Besylate Rash  . Aspirin Rash and Other (See Comments)    GI distress    Vital Signs: Temp: 97.4 F (36.3 C) (09/18 1235) Temp src: Oral (09/18 1235) BP: 134/82 mmHg (09/18 1634) Pulse Rate: 60 (09/18 1449)  Labs:  Recent Labs  04/09/13 1426  HGB 9.4*  HCT 28.8*  PLT 206  LABPROT 23.9*  INR 2.22*  CREATININE 1.57*    The CrCl is unknown because both a height and weight (above a minimum accepted value) are required for this calculation.   Medical History: Past Medical History  Diagnosis Date  . CHF (congestive heart failure)     EF 30-35% on 2D Echo in July 2012  . High cholesterol   . Atrial fibrillation     rate controlled, on chronic coumadin therapy  . Sick sinus syndrome     s/p pacemaker  . Chronic kidney disease (CKD), stage III (moderate)   . Anemia     baseline 7.8-9.0  . Coronary artery disease     stent in 2008 shows CABG to LAD, RCA and circumflex with 100% occlusion  . Heart murmur   . Mitral regurgitation     severe MR,EF 30-35%  . Ischemic cardiomyopathy   . Aortic stenosis     severe  . Renal insufficiency, mild     Assessment: 79 year old female on Coumadin PTA for Afib.  INR is therapeutic at 2.22 on home dose of 2.5 mg daily.  Goal of Therapy:  INR 2-3 Monitor platelets by anticoagulation protocol: Yes   Plan:  1) Coumadin 2.5 mg po daily at 1800 pm starting 9/19 2) Daily INR  Thank you. Okey Regal, PharmD [phone]  04/09/2013,6:04 PM

## 2013-04-09 NOTE — ED Notes (Signed)
To ED from [facility] Physician's office via [facility] 30-- with c/o feeling "Blah" -- hx of chf, 2+ pitting edema from feet to knees. On arrival-- pt is alert/oriented, drove self to dr's office.  Family (Kathy--granddaughter) notified at [phone].

## 2013-04-09 NOTE — H&P (Signed)
[name] is an 79 y.o. female.    Primary Cardiologist:Dr. Ruby Cola PCP:  Dr. Everlene Other   Chief Complaint: " I feel worthless" HPI: 3 YOAAF, woke up feeling worthless, no energy, no chest pain, maybe a little shortness of breath.  She drove to see her PCP and in his office SPO2 was 77% on room air.  She was then was transported by EMS to [facility] for further eval.  Here Pro BNP is 9265, K+ low, H/H 9.4/28.8; INR 2.22, CXR  As below.   She has an extensive history of cardiac problems, and has been followed in our office by Dr. Elsie Lincoln, then by Dr. Royann Shivers. Her history includes severe mitral insufficiency, severe aortic stenosis, moderate pulmonary hypertension, and coronary artery disease. She is status post previous bypass surgery, in the 1980's, (graft-dependent with totally occluded native coronary arteries, occluded saphenous vein graft to the right coronary artery, patent sequential vein graft to the LAD diagonal, OM1 and OM2). These findings are from her last cath in 2008, by Dr. Elsie Lincoln. Since then, she has been treated medically for her CAD. She also has CHF, and Permanent atrial fibrillation. She is on chronic anticoagulation therapy with warfarin. She also has a pacemaker [employer]). She apparently had SSS in the past. The device was interrogated during her last office visit, with Dr. Royann Shivers in January of this year, and was functioning normally.    Echo 2010 EF 50-55%, grade 1 diastolc dysfunction, mild to mod AS Mild MR, Lt atrium moderately dilated.  Rt atrium mild to mod dilated. Mild to mod TR.  PA pk pressure 57 mm HG.   Last Nuc study 2008.  Dobutamine stress Echo 01/2011 With results MI in RCA no ischemia, ischemic mitral insuff, intraventricular dyssynchrony is proment at baseline but improves with dobutamine.     EKG with atrial fib with pacing.  She has been given 20 mg IV lasix. SPO2 here in the ER in the 90s (97-95) on room air.  Currently no complaints, just felt  well ( her usual) yesterday and then this AM very tired.  She has had some lower ext. Edema.  No syncope.          Past Medical History  Diagnosis Date  . CHF (congestive heart failure)     EF 30-35% on 2D Echo in July 2012  . High cholesterol   . Atrial fibrillation     rate controlled, on chronic coumadin therapy  . Sick sinus syndrome     s/p pacemaker  . Chronic kidney disease (CKD), stage III (moderate)   . Anemia     baseline 7.8-9.0  . Coronary artery disease     stent in 2008 shows CABG to LAD, RCA and circumflex with 100% occlusion  . Heart murmur   . Mitral regurgitation     severe MR,EF 30-35%  . Ischemic cardiomyopathy   . Aortic stenosis     severe  . Renal insufficiency, mild     Past Surgical History  Procedure Laterality Date  . Exploratory laparotomy w/ bowel resection  2/12    in setting of SBO  . Pacemaker insertion  03/30/2009    [employer]  . R breast lumpectomy  05/2004  . Ureterolithotomy  01/2002  . Insert / replace / remove pacemaker    . Coronary artery bypass graft  1980's    History reviewed. No pertinent family history. Social History:  reports that she has never  smoked. She has never used smokeless tobacco. She reports that she does not drink alcohol or use illicit drugs.  Allergies:  Allergies  Allergen Reactions  . Ace Inhibitors Cough  . Food Other (See Comments)    Berries,tomato  . Nutritional Supplements Other (See Comments)    Unknown.  Marland Kitchen Penicillins Swelling  . Simvastatin Other (See Comments)    Myalgia   . Amlodipine Besylate Rash  . Aspirin Rash and Other (See Comments)    GI distress    Outpatient Medications:  carvedilol (COREG) 3.125 MG tablet  Take 3.125 mg by mouth 2 (two) times daily with a meal.  .  diclofenac (FLECTOR) 1.3 % PTCH  Place 1 patch onto the skin daily.  5 patch  0  .  donepezil (ARICEPT) 5 MG tablet  Take 5 mg by mouth at bedtime.  Marland Kitchen  ezetimibe (ZETIA) 10 MG tablet  Take 10 mg by  mouth daily.  .  furosemide (LASIX) 40 MG tablet  Take 40 mg by mouth daily. Take 1/2 tablet daily  .  nitroGLYCERIN (NITROSTAT) 0.4 MG SL tablet  Place 1 tablet (0.4 mg total) under the tongue every 5 (five) minutes as needed for chest pain.  20 tablet  0  .  pantoprazole (PROTONIX) 40 MG tablet  Take 40 mg by mouth 2 (two) times daily.  .  potassium chloride SA (K-DUR,KLOR-CON) 20 MEQ tablet  Take 20 mEq by mouth daily.  Marland Kitchen  warfarin (COUMADIN) 5 MG tablet          Results for orders placed during the hospital encounter of 04/09/13 (from the past 48 hour(s))  CBC WITH DIFFERENTIAL     Status: Abnormal   Collection Time    04/09/13  2:26 PM      Result Value Range   WBC 4.0  4.0 - 10.5 K/uL   RBC 3.03 (*) 3.87 - 5.11 MIL/uL   Hemoglobin 9.4 (*) 12.0 - 15.0 g/dL   HCT 16.1 (*) 09.6 - 04.5 %   MCV 95.0  78.0 - 100.0 fL   MCH 31.0  26.0 - 34.0 pg   MCHC 32.6  30.0 - 36.0 g/dL   RDW 40.9 (*) 81.1 - 91.4 %   Platelets 206  150 - 400 K/uL   Neutrophils Relative % 62  43 - 77 %   Neutro Abs 2.5  1.7 - 7.7 K/uL   Lymphocytes Relative 22  12 - 46 %   Lymphs Abs 0.9  0.7 - 4.0 K/uL   Monocytes Relative 10  3 - 12 %   Monocytes Absolute 0.4  0.1 - 1.0 K/uL   Eosinophils Relative 5  0 - 5 %   Eosinophils Absolute 0.2  0.0 - 0.7 K/uL   Basophils Relative 1  0 - 1 %   Basophils Absolute 0.1  0.0 - 0.1 K/uL  COMPREHENSIVE METABOLIC PANEL     Status: Abnormal   Collection Time    04/09/13  2:26 PM      Result Value Range   Sodium 141  135 - 145 mEq/L   Potassium 3.4 (*) 3.5 - 5.1 mEq/L   Chloride 105  96 - 112 mEq/L   CO2 23  19 - 32 mEq/L   Glucose, Bld 96  70 - 99 mg/dL   BUN 35 (*) 6 - 23 mg/dL   Creatinine, Ser 7.82 (*) 0.50 - 1.10 mg/dL   Calcium 9.1  8.4 - 95.6 mg/dL   Total Protein 6.6  6.0 - 8.3 g/dL   Albumin 3.9  3.5 - 5.2 g/dL   AST 28  0 - 37 U/L   ALT 11  0 - 35 U/L   Alkaline Phosphatase 164 (*) 39 - 117 U/L   Total Bilirubin 2.3 (*) 0.3 - 1.2 mg/dL    GFR calc non Af Amer 27 (*) >90 mL/min   GFR calc Af Amer 32 (*) >90 mL/min   Comment: (NOTE)     The eGFR has been calculated using the CKD EPI equation.     This calculation has not been validated in all clinical situations.     eGFR's persistently <90 mL/min signify possible Chronic Kidney     Disease.  PRO B NATRIURETIC PEPTIDE     Status: Abnormal   Collection Time    04/09/13  2:26 PM      Result Value Range   Pro B Natriuretic peptide (BNP) 9265.0 (*) 0 - 450 pg/mL  PROTIME-INR     Status: Abnormal   Collection Time    04/09/13  2:26 PM      Result Value Range   Prothrombin Time 23.9 (*) 11.6 - 15.2 seconds   INR 2.22 (*) 0.00 - 1.49  POCT I-STAT TROPONIN I     Status: None   Collection Time    04/09/13  2:46 PM      Result Value Range   Troponin i, poc 0.02  0.00 - 0.08 ng/mL   Comment 3            Comment: Due to the release kinetics of cTnI,     a negative result within the first hours     of the onset of symptoms does not rule out     myocardial infarction with certainty.     If myocardial infarction is still suspected,     repeat the test at appropriate intervals.  URINALYSIS, ROUTINE W REFLEX MICROSCOPIC     Status: Abnormal   Collection Time    04/09/13  2:57 PM      Result Value Range   Color, Urine YELLOW  YELLOW   APPearance HAZY (*) CLEAR   Specific Gravity, Urine 1.010  1.005 - 1.030   pH 5.0  5.0 - 8.0   Glucose, UA NEGATIVE  NEGATIVE mg/dL   Hgb urine dipstick NEGATIVE  NEGATIVE   Bilirubin Urine NEGATIVE  NEGATIVE   Ketones, ur NEGATIVE  NEGATIVE mg/dL   Protein, ur NEGATIVE  NEGATIVE mg/dL   Urobilinogen, UA 0.2  0.0 - 1.0 mg/dL   Nitrite NEGATIVE  NEGATIVE   Leukocytes, UA NEGATIVE  NEGATIVE   Comment: MICROSCOPIC NOT DONE ON URINES WITH NEGATIVE PROTEIN, BLOOD, LEUKOCYTES, NITRITE, OR GLUCOSE <1000 mg/dL.   Dg Chest 2 View  04/09/2013   CLINICAL DATA:  Congestive heart failure.  EXAM: CHEST  2 VIEW  COMPARISON:  PA and lateral chest  09/08/2012.  FINDINGS: Pacing device in place. There is cardiomegaly and vascular congestion without frank edema. Mild subsegmental atelectasis or scarring is seen lung bases. Trace right pleural effusion is noted.  IMPRESSION: Cardiomegaly and vascular congestion without frank edema.   Electronically Signed   By: Drusilla Kanner M.D.   On: 04/09/2013 13:51    ROS: General:no colds or fevers, no weight changes Skin:no rashes or ulcers HEENT:no blurred vision, no congestion CV:see HPI PUL:see HPI GI:no diarrhea constipation or melena, no indigestion GU:no hematuria, no dysuria MS:no joint pain, no claudication, some lower ext edema Neuro:no syncope,  no lightheadedness Endo:no diabetes, no thyroid disease   Blood pressure 134/82, pulse 60, temperature 97.4 F (36.3 C), temperature source Oral, resp. rate 17, SpO2 96.00%. PE: General:Pleasant affect, NAD Skin:Warm and dry, brisk capillary refill HEENT:normocephalic, sclera clear, mucus membranes moist, very hard of hearing Neck:supple, chronic JVD to jaw line, no bruits  Heart: RRR with harsh 2-3/6 systolic murmur. No gallup, rub or click Lungs:clear without rales, rhonchi, or wheezes OZH:YQMV, non tender, + BS, do not palpate liver spleen or masses Ext:1-2+ lower ext edema, 1+ pedal pulses, 2+ radial pulses Neuro:alert and oriented, MAE, follows commands, + facial symmetry    Assessment/Plan Principal Problem:   Acute on chronic diastolic congestive heart failure with hypoxia Active Problems:   CARDIOMYOPATHY   Atrial fibrillation, permanent   Chronic diastolic heart failure   Coronary artery disease - S/P remote CABG x 4 (1980's)   Chronic anticoagulation - on Warfarin   Aortic stenosis   Mitral insufficiency   Pacemaker - implanted for SSS ([employer])   HOH (hard of hearing)  PLAN: Admit, to tele, IV lasix 40 mg now (has already had 20 mg earlier)  And recheck in AM.  Check PPM, we do not want her to pace if possible.  Also  anemic, though she runs 9-10 Hgb. wil check Iron levels.   Will stop coreg.  She has had her dig stopped and we do not want to restart.  Her BNP is elevated but not as high as it has been.      Pacemaker has been interrogated here in the ER.  It has normal function, underlying her permanent atrial fib. Rate currently 55-70.  Lower rate is set on 60 BPM, with historesis at 40 BPM.  Longevity of > 10 yrs.    [facility] R Nurse Practitioner Certified [facility] and [facility] Pager [phone] 04/09/2013, 4:57 PM

## 2013-04-09 NOTE — ED Notes (Signed)
Diet ordered from service response 

## 2013-04-09 NOTE — ED Provider Notes (Signed)
CSN: [id number]     Arrival date & time 04/09/13  1222 History   First MD Initiated Contact with Patient 04/09/13 1223     Chief Complaint  Patient presents with  . Congestive Heart Failure   (Consider location/radiation/quality/duration/timing/severity/associated sxs/prior Treatment) HPI This is a 79 year old female with a history of atrial fibrillation, air, coronary artery disease, aortic stenosis who presents with "I just feel good."  Patient states the last several days she's felt "blah." She denies any chest pain.  Patient does endorse some shortness of breath and orthopnea. She denies any fevers or cough. Patient was seen by her primary care doctor today and was noted to have a pulse ox of 77% on room air. She was placed on 2 L of nasal cannula with improvement of her oxygen saturations. Patient otherwise denies any symptoms including headache, abdominal pain, urinary symptoms, focal weakness or numbness. Past Medical History  Diagnosis Date  . CHF (congestive heart failure)     EF 30-35% on 2D Echo in July 2012  . High cholesterol   . Atrial fibrillation     rate controlled, on chronic coumadin therapy  . Sick sinus syndrome     s/p pacemaker  . Chronic kidney disease (CKD), stage III (moderate)   . Anemia     baseline 7.8-9.0  . Coronary artery disease     stent in 2008 shows CABG to LAD, RCA and circumflex with 100% occlusion  . Heart murmur   . Mitral regurgitation     severe MR,EF 30-35%  . Ischemic cardiomyopathy   . Aortic stenosis     severe  . Renal insufficiency, mild    Past Surgical History  Procedure Laterality Date  . Exploratory laparotomy w/ bowel resection  2/12    in setting of SBO  . Pacemaker insertion  03/30/2009    [employer]  . R breast lumpectomy  05/2004  . Ureterolithotomy  01/2002  . Insert / replace / remove pacemaker    . Coronary artery bypass graft  1980's   History reviewed. No pertinent family history. History  Substance Use  Topics  . Smoking status: Never Smoker   . Smokeless tobacco: Never Used  . Alcohol Use: No   OB History   Grav Para Term Preterm Abortions TAB SAB Ect Mult Living                 Review of Systems  Constitutional: Negative for fever.  Respiratory: Positive for shortness of breath. Negative for cough and chest tightness.   Cardiovascular: Negative for chest pain.  Gastrointestinal: Negative for nausea, vomiting and abdominal pain.  Genitourinary: Negative for dysuria.  Musculoskeletal: Negative for back pain.  Skin: Negative for rash.  Neurological: Negative for dizziness, syncope and headaches.  Psychiatric/Behavioral: Negative for confusion.  All other systems reviewed and are negative.    Allergies  Ace inhibitors; Food; Nutritional supplements; Penicillins; Simvastatin; Amlodipine besylate; and Aspirin  Home Medications   Current Outpatient Rx  Name  Route  Sig  Dispense  Refill  . carvedilol (COREG) 3.125 MG tablet   Oral   Take 3.125 mg by mouth 2 (two) times daily with a meal.           . donepezil (ARICEPT) 5 MG tablet   Oral   Take 5 mg by mouth at bedtime.         Marland Kitchen ezetimibe (ZETIA) 10 MG tablet   Oral   Take 10 mg by mouth daily.           Marland Kitchen  furosemide (LASIX) 40 MG tablet   Oral   Take 40 mg by mouth daily. Take 1/2 tablet daily         . nitroGLYCERIN (NITROSTAT) 0.4 MG SL tablet   Sublingual   Place 1 tablet (0.4 mg total) under the tongue every 5 (five) minutes as needed for chest pain.   20 tablet   0   . pantoprazole (PROTONIX) 40 MG tablet   Oral   Take 40 mg by mouth daily.          Marland Kitchen warfarin (COUMADIN) 5 MG tablet   Oral   Take 2.5 mg by mouth daily.          BP 127/70  Pulse 60  Temp(Src) 97.4 F (36.3 C) (Oral)  Resp 18  SpO2 95% Physical Exam  Nursing note and vitals reviewed. Constitutional: She is oriented to person, place, and time. She appears well-developed and well-nourished. No distress.  Elderly   HENT:  Head: Normocephalic and atraumatic.  Eyes: Pupils are equal, round, and reactive to light.  Neck: Neck supple. No JVD present.  Cardiovascular: Normal rate, regular rhythm and normal heart sounds.   Pulmonary/Chest: Effort normal. No respiratory distress. She has wheezes.  Abdominal: Soft. Bowel sounds are normal. There is no tenderness.  Musculoskeletal:  2+ bilateral lower extremity edema  Neurological: She is alert and oriented to person, place, and time.  Skin: Skin is warm and dry.  Psychiatric: She has a normal mood and affect.    ED Course  Procedures (including critical care time) Labs Review Labs Reviewed  CBC WITH DIFFERENTIAL - Abnormal; Notable for the following:    RBC 3.03 (*)    Hemoglobin 9.4 (*)    HCT 28.8 (*)    RDW 17.6 (*)    All other components within normal limits  COMPREHENSIVE METABOLIC PANEL - Abnormal; Notable for the following:    Potassium 3.4 (*)    BUN 35 (*)    Creatinine, Ser 1.57 (*)    Alkaline Phosphatase 164 (*)    Total Bilirubin 2.3 (*)    GFR calc non Af Amer 27 (*)    GFR calc Af Amer 32 (*)    All other components within normal limits  PRO B NATRIURETIC PEPTIDE - Abnormal; Notable for the following:    Pro B Natriuretic peptide (BNP) 9265.0 (*)    All other components within normal limits  URINALYSIS, ROUTINE W REFLEX MICROSCOPIC - Abnormal; Notable for the following:    APPearance HAZY (*)    All other components within normal limits  PROTIME-INR - Abnormal; Notable for the following:    Prothrombin Time 23.9 (*)    INR 2.22 (*)    All other components within normal limits  POCT I-STAT TROPONIN I   Imaging Review Dg Chest 2 View  04/09/2013   CLINICAL DATA:  Congestive heart failure.  EXAM: CHEST  2 VIEW  COMPARISON:  PA and lateral chest 09/08/2012.  FINDINGS: Pacing device in place. There is cardiomegaly and vascular congestion without frank edema. Mild subsegmental atelectasis or scarring is seen lung bases. Trace  right pleural effusion is noted.  IMPRESSION: Cardiomegaly and vascular congestion without frank edema.   Electronically Signed   By: Drusilla Kanner M.D.   On: 04/09/2013 13:51   EKG independently reviewed by myself: Ventricular paced rhythm, unchanged from prior, no evidence of ST elevation or ischemia MDM   1. Acute heart failure     This is a 79 year old  female who presents with no specific complaints but was found to have oxygen saturations of 77% and bilateral lower extremity edema at her doctor's office. She has some wheezing on pulmonary exam. She's otherwise in no acute distress. Oxygen saturations here are 97% on room air. Patient was given 20 mg of IV Lasix which is her home dose. Chest x-ray shows vascular congestion and cardiomegaly without frank edema. Troponin is negative. EKG is reassuring. Given patient's age and evidence of peripheral edema, I feel she needs admission for further diuresis and fluid optimization.    Shon Baton, MD 04/09/13 [phone]

## 2013-04-10 DIAGNOSIS — I059 Rheumatic mitral valve disease, unspecified: Secondary | ICD-10-CM

## 2013-04-10 LAB — TROPONIN I
Troponin I: <0.3 ng/mL (ref <0.30)
Troponin I: <0.3 ng/mL (ref <0.30)

## 2013-04-10 LAB — CBC
Hemoglobin: 9.2 g/dL — ABNORMAL LOW (ref 12.0–15.0)
RBC: 2.88 MIL/uL — ABNORMAL LOW (ref 3.87–5.11)

## 2013-04-10 LAB — FOLATE: Folate: 18.2 ng/mL

## 2013-04-10 LAB — BASIC METABOLIC PANEL
BUN: 36 mg/dL — ABNORMAL HIGH (ref 6–23)
CO2: 23 mEq/L (ref 19–32)
Chloride: 110 mEq/L (ref 96–112)
GFR calc Af Amer: 29 mL/min — ABNORMAL LOW (ref >90)
Potassium: 4.5 mEq/L (ref 3.5–5.1)

## 2013-04-10 LAB — MAGNESIUM: Magnesium: 2.5 mg/dL (ref 1.5–2.5)

## 2013-04-10 LAB — PRO B NATRIURETIC PEPTIDE: Pro B Natriuretic peptide (BNP): 10794 pg/mL — ABNORMAL HIGH (ref 0–450)

## 2013-04-10 LAB — FERRITIN: Ferritin: 134 ng/mL (ref 10–291)

## 2013-04-10 MED ORDER — FUROSEMIDE 10 MG/ML IJ SOLN
40.0000 mg | Freq: Two times a day (BID) | INTRAMUSCULAR | Status: DC
  Administered 2013-04-10 – 2013-04-14 (×8): 40 mg via INTRAVENOUS
  Filled 2013-04-10 (×11): qty 4

## 2013-04-10 NOTE — Evaluation (Signed)
Physical Therapy Evaluation Patient Details Name: [name] MRN: [id number] DOB: 1918-06-22 Today's Date: 04/10/2013 Time: 4098-1191 PT Time Calculation (min): 28 min  PT Assessment / Plan / Recommendation History of Present Illness   89 YOAAF, woke up feeling worthless, no energy, no chest pain, maybe a little shortness of breath.  She drove to see her PCP and in his office SPO2 was 77% on room air.  She was then was transported by EMS to [facility] for further eval. Here Pro BNP is 9265, K+ low, H/H 9.4/28.8; INR 2.22   Clinical Impression  Pt admitted with the above. Pt currently with functional limitations due to the deficits listed below (see PT Problem List). Pt with increase SOB with ambulation however SaO2 maintained > 95%.  Pt will benefit from skilled PT to increase their independence and safety with mobility to allow discharge to the venue listed below.      PT Assessment  Patient needs continued PT services    Follow Up Recommendations  SNF (If family able to provide 24 hr assistance then maybe HHPT )    Barriers to Discharge Decreased caregiver support Family trying to find 24 hour assistance; if not availabe pt will need SNF    Equipment Recommendations  Rolling walker with 5" wheels    Frequency Min 3X/week    Precautions / Restrictions Precautions Precautions: Fall   Pertinent Vitals/Pain No c/o pain; increase SOB with activity      Mobility  Bed Mobility Bed Mobility: Supine to Sit;Sitting - Scoot to Edge of Bed Supine to Sit: 4: Min assist;HOB flat;With rails Sitting - Scoot to Edge of Bed: 4: Min guard Details for Bed Mobility Assistance: (A) to elevate trunk OOB with cues for techinque Transfers Transfers: Sit to Stand;Stand to Sit Sit to Stand: 4: Min assist;From bed Stand to Sit: 4: Min assist;To chair/3-in-1 Details for Transfer Assistance: (A) to initiate transfer and slowly descend to chair with cues for hand  placement. Ambulation/Gait Ambulation/Gait Assistance: 1: +2 Total assist;4: Min assist Ambulation/Gait: Patient Percentage: 70% Ambulation Distance (Feet): 100 Feet Assistive device: Rolling walker;2 person hand held assist Ambulation/Gait Assistance Details: Initially pt ambulate with HHA x 2 however introduced RW and pt able to ambulate with min (A) for turns with max cues for RW placement Gait Pattern: Step-through pattern;Decreased stride length;Shuffle;Trunk flexed Stairs: No    Exercises     PT Diagnosis: Difficulty walking;Generalized weakness  PT Problem List: Decreased strength;Decreased activity tolerance;Decreased balance;Decreased mobility;Decreased knowledge of use of DME;Cardiopulmonary status limiting activity PT Treatment Interventions: DME instruction;Gait training;Stair training;Functional mobility training;Therapeutic activities;Therapeutic exercise;Balance training;Patient/family education     PT Goals(Current goals can be found in the care plan section) Acute Rehab PT Goals Patient Stated Goal: To go home to her dog PT Goal Formulation: With patient Time For Goal Achievement: 04/17/13 Potential to Achieve Goals: Good  Visit Information  Last PT Received On: 04/10/13 Assistance Needed: +1 History of Present Illness:  89 YOAAF, woke up feeling worthless, no energy, no chest pain, maybe a little shortness of breath.  She drove to see her PCP and in his office SPO2 was 77% on room air.  She was then was transported by EMS to [facility] for further eval. Here Pro BNP is 9265, K+ low, H/H 9.4/28.8; INR 2.22        Prior Functioning  Home Living Family/patient expects to be discharged to:: Private residence Living Arrangements: Alone Available Help at Discharge: Family;Available 24 hours/day Type of Home: House Home  Access: Level entry Home Layout: One level Home Equipment: Cane - single point;Shower seat Prior Function Level of Independence:  Independent Communication Communication: HOH Dominant Hand: Right    Cognition  Cognition Arousal/Alertness: Awake/alert Behavior During Therapy: WFL for tasks assessed/performed Overall Cognitive Status: Within Functional Limits for tasks assessed    Extremity/Trunk Assessment Lower Extremity Assessment Lower Extremity Assessment: Generalized weakness   Balance Balance Balance Assessed: Yes Static Sitting Balance Static Sitting - Balance Support: Feet supported Static Sitting - Level of Assistance: 5: Stand by assistance  End of Session PT - End of Session Equipment Utilized During Treatment: Gait belt Activity Tolerance: Patient tolerated treatment well Patient left: in chair;with call bell/phone within reach Nurse Communication: Mobility status  GP     Celenia Hruska 04/10/2013, 1:13 PM  Jake Shark, PT DPT [phone]

## 2013-04-10 NOTE — Evaluation (Signed)
Occupational Therapy Evaluation Patient Details Name: [name] MRN: [id number] DOB: October 25, 1917 Today's Date: 04/10/2013 Time: 6295-2841 OT Time Calculation (min): 28 min  OT Assessment / Plan / Recommendation History of present illness  8 YOAAF, woke up feeling worthless, no energy, no chest pain, maybe a little shortness of breath.  She drove to see her PCP and in his office SPO2 was 77% on room air.  She was then was transported by EMS to [facility] for further eval. Here Pro BNP is 9265, K+ low, H/H 9.4/28.8; INR 2.22    Clinical Impression   Pt admitted with above. Pt demonstrating decreased activity tolerance as well as below problem list. Will continue to follow acutely in order to address below problem list.  Pt does not have 24/7 supervision/assist at home, therefore recommending SNF at this time. If pt is able to reach mod I level while in hospital, will then update plan to [facility].    OT Assessment  Patient needs continued OT Services    Follow Up Recommendations  SNF (unless able to get 24/7 supervision/assist at home)   Barriers to Discharge Decreased caregiver support does not have 24/7 supervision  Equipment Recommendations  3 in 1 bedside comode    Recommendations for Other Services    Frequency  Min 2X/week    Precautions / Restrictions Precautions Precautions: Fall   Pertinent Vitals/Pain See vitals   ADL  Upper Body Bathing: Simulated;Set up Where Assessed - Upper Body Bathing: Unsupported sitting Lower Body Bathing: Simulated;Minimal assistance Where Assessed - Lower Body Bathing: Supported sit to stand Upper Body Dressing: Performed;Set up Where Assessed - Upper Body Dressing: Unsupported sitting Lower Body Dressing: Performed;Minimal assistance Where Assessed - Lower Body Dressing: Supported sit to stand Toilet Transfer: Simulated;Minimal assistance Toilet Transfer Method: Sit to Barista:  (bed<>ambulate in hall<>chair in  room) Equipment Used: Gait belt;Rolling walker Transfers/Ambulation Related to ADLs: Min guard with RW. While ambulating in hall, pt began to quickly fatigue and required 2 standing rest breaks before returning to room. ADL Comments: Pt generally weak. Pt's close family friend (adopted Information systems manager) states pt is definitely moving more slowly than at baseline.  Pt has a younger sister who could come stay with her initially at home, but pt does not like this option and states she would rather go to rehab (states her sister drives her crazy).    OT Diagnosis: Generalized weakness  OT Problem List: Decreased strength;Decreased activity tolerance;Impaired balance (sitting and/or standing);Decreased knowledge of use of DME or AE OT Treatment Interventions: Self-care/ADL training;DME and/or AE instruction;Therapeutic activities;Patient/family education;Balance training   OT Goals(Current goals can be found in the care plan section) Acute Rehab OT Goals Patient Stated Goal: To go home to her dog OT Goal Formulation: With patient Time For Goal Achievement: 04/24/13 Potential to Achieve Goals: Good  Visit Information  Last OT Received On: 04/10/13 Assistance Needed: +1 History of Present Illness:  89 YOAAF, woke up feeling worthless, no energy, no chest pain, maybe a little shortness of breath.  She drove to see her PCP and in his office SPO2 was 77% on room air.  She was then was transported by EMS to [facility] for further eval. Here Pro BNP is 9265, K+ low, H/H 9.4/28.8; INR 2.22        Prior Functioning     Home Living Family/patient expects to be discharged to:: Private residence Living Arrangements: Alone Available Help at Discharge: Family;Available 24 hours/day Type of Home: House Home Access:  Level entry Home Layout: One level Home Equipment: Cane - single point;Shower seat Prior Function Level of Independence: Independent Communication Communication: HOH Dominant Hand: Right          Vision/Perception Vision - History Baseline Vision: Bifocals   Cognition  Cognition Arousal/Alertness: Awake/alert Behavior During Therapy: WFL for tasks assessed/performed Overall Cognitive Status: Within Functional Limits for tasks assessed    Extremity/Trunk Assessment Upper Extremity Assessment Upper Extremity Assessment: Overall WFL for tasks assessed     Mobility Bed Mobility Bed Mobility: Supine to Sit;Sitting - Scoot to Edge of Bed Supine to Sit: 4: Min assist;HOB flat;With rails Sitting - Scoot to Edge of Bed: 4: Min guard Details for Bed Mobility Assistance: (A) to elevate trunk OOB with cues for techinque Transfers Transfers: Sit to Stand;Stand to Sit Sit to Stand: 4: Min assist;From bed Stand to Sit: 4: Min assist;To chair/3-in-1 Details for Transfer Assistance: (A) to initiate transfer and slowly descend to chair with cues for hand placement.     Exercise     Balance Balance Balance Assessed: Yes Static Sitting Balance Static Sitting - Balance Support: Feet supported Static Sitting - Level of Assistance: 5: Stand by assistance   End of Session OT - End of Session Equipment Utilized During Treatment: Gait belt;Rolling walker Activity Tolerance: Patient limited by fatigue Patient left: in chair;with call bell/phone within reach;with family/visitor present Nurse Communication: Mobility status  GO   04/10/2013 Cipriano Mile OTR/L Pager [phone] Office [phone]   Cipriano Mile 04/10/2013, 4:27 PM

## 2013-04-10 NOTE — Progress Notes (Signed)
[facility]  Subjective: States that she feels very tired and fatigued. No other complaints.   Objective: Vital signs in last 24 hours: Temp:  [97.4 F (36.3 C)-98.9 F (37.2 C)] 97.8 F (36.6 C) (09/19 0433) Pulse Rate:  [56-80] 71 (09/19 0433) Resp:  [17-26] 19 (09/19 0433) BP: (105-137)/(58-82) 137/76 mmHg (09/19 0433) SpO2:  [92 %-99 %] 94 % (09/19 0433) Weight:  [136 lb 11.2 oz (62.007 kg)-137 lb (62.143 kg)] 136 lb 11.2 oz (62.007 kg) (09/19 0433) Last BM Date: 04/09/13  Intake/Output from previous day:   Intake/Output this shift:    Medications Current Facility-Administered Medications  Medication Dose Route Frequency Provider Last Rate Last Dose  . 0.9 %  sodium chloride infusion  250 mL Intravenous PRN Nada Boozer, NP      . acetaminophen (TYLENOL) tablet 650 mg  650 mg Oral Q4H PRN Nada Boozer, NP      . ALPRAZolam Prudy Feeler) tablet 0.25 mg  0.25 mg Oral BID PRN Nada Boozer, NP      . donepezil (ARICEPT) tablet 5 mg  5 mg Oral QHS Nada Boozer, NP   5 mg at 04/09/13 2244  . ezetimibe (ZETIA) tablet 10 mg  10 mg Oral Daily Nada Boozer, NP      . nitroGLYCERIN (NITROGLYN) 2 % ointment 1 inch  1 inch Topical Q6H Mihai Croitoru, MD   1 inch at 04/10/13 0526  . nitroGLYCERIN (NITROSTAT) SL tablet 0.4 mg  0.4 mg Sublingual Q5 min PRN Nada Boozer, NP      . ondansetron [facility]) injection 4 mg  4 mg Intravenous Q6H PRN Nada Boozer, NP      . pantoprazole (PROTONIX) EC tablet 40 mg  40 mg Oral Daily Nada Boozer, NP   40 mg at 04/09/13 2244  . sodium chloride 0.9 % injection 3 mL  3 mL Intravenous Q12H Nada Boozer, NP   3 mL at 04/09/13 2245  . sodium chloride 0.9 % injection 3 mL  3 mL Intravenous PRN Nada Boozer, NP      . warfarin (COUMADIN) tablet 2.5 mg  2.5 mg Oral q1800 Shon Baton, MD      . Warfarin - Pharmacist Dosing Inpatient   Does not apply [id number] Shon Baton, MD        PE: General appearance: alert, cooperative  and no distress Neck: distended neck veins Lungs: bibasilar crackles, R>L Heart: irregularly irregular rhythm and 2/6 murmur Extremities: 1+ pretibial edema, R>L Pulses: 2+ and symmetric Skin: warm and dry Neurologic: Grossly normal  Lab Results:   Recent Labs  04/09/13 1426 04/10/13 0545  WBC 4.0 4.2  HGB 9.4* 9.2*  HCT 28.8* 27.0*  PLT 206 202   BMET  Recent Labs  04/09/13 1426 04/10/13 0545  NA 141 144  K 3.4* 4.5  CL 105 110  CO2 23 23  GLUCOSE 96 94  BUN 35* 36*  CREATININE 1.57* 1.69*  CALCIUM 9.1 9.1   PT/INR  Recent Labs  04/09/13 1426  LABPROT 23.9*  INR 2.22*   Cardiac Panel (last 3 results)  Recent Labs  04/09/13 1833 04/10/13 04/10/13 0545  TROPONINI <0.30 <0.30 <0.30    Studies/Results: BNP (last 3 results)  Recent Labs  09/08/12 0943 04/09/13 1426 04/10/13 0545  PROBNP 8879.0* 9265.0* 10794.0*   Filed Weights   04/09/13 2138 04/10/13 0433  Weight: 137 lb (62.143 kg) 136 lb 11.2 oz (62.007 kg)    Assessment/Plan  Principal Problem:  Acute on chronic diastolic congestive heart failure with hypoxia Active Problems:   Coronary artery disease - S/P remote CABG x 4 (1980's)   CARDIOMYOPATHY   Atrial fibrillation, permanent   Chronic diastolic heart failure   Chronic anticoagulation - on Warfarin   Aortic stenosis   Mitral insufficiency   Pacemaker - implanted for SSS ([employer] [facility])   HOH (hard of hearing)  Plan: Pt was admitted yesterday for IV diuretics for a/c diastolic CHF. BNP is slightly up today compared to yesterday at 10,794 (9,265 on admission). No I/Os recorded. Dry weight, historically, has been ~114 lbs. Her weight is only down 1 lb since yesterday at 136 lbs. Will need to try to diureses down to dry weight. Would recommend giving another dose of IV Lasix today and another tonight. Will need to monitor renal function. SCr is slightly increased today. Will ask RN to record strict I/Os. Continue with daily weights.  Continue with 2 gm Na restricted diet. MD to follow.     LOS: 1 day    Brittainy M. Delmer Islam 04/10/2013 9:54 AM  I have seen and evaluated the patient this AM along with Boyce Medici, PA. I agree with her findings, examination as well as impression recommendations.  By current Wgt assessment, she is ~20 lb above her optimal wgt.  She clearly needs additional IV diuresis -- will test again with IV dose, ordered strict I/Os b/c none currently recorded.  40mg  bid IV Lasix ordered  Avoid AVN agents to avoid V pacing (with reduced function) Will need to monitor BP closely to minimize afterload.  MD Time with pt: 10 min  Jaislyn Blinn W, M.D., M.S. [facility] [address] [place], [place]  [address]  [phone] Pager # [phone] 04/10/2013 12:19 PM

## 2013-04-10 NOTE — Progress Notes (Signed)
Patient lives alone and desires to return to home at discharge.  Spoke with her briefly at bedside.  She has marked HOH.  She was unable to understand the presented material regarding services.  Will request the assistance of inpatient CM to engage her care.  Of note, [facility] Care Management services does not replace or interfere with any services that are arranged by inpatient case management or social work.  For additional questions or referrals please contact Anibal Henderson BSN RN [facility] Liaison at [phone].

## 2013-04-10 NOTE — Progress Notes (Signed)
Utilization Review Completed.   Tashana Haberl Coop, RN, BSN Nurse Case Manager  [phone]  

## 2013-04-11 DIAGNOSIS — I359 Nonrheumatic aortic valve disorder, unspecified: Secondary | ICD-10-CM

## 2013-04-11 LAB — BASIC METABOLIC PANEL
BUN: 34 mg/dL — ABNORMAL HIGH (ref 6–23)
Chloride: 107 mEq/L (ref 96–112)
Creatinine, Ser: 1.72 mg/dL — ABNORMAL HIGH (ref 0.50–1.10)
GFR calc Af Amer: 28 mL/min — ABNORMAL LOW (ref >90)

## 2013-04-11 LAB — CBC
HCT: 26.3 % — ABNORMAL LOW (ref 36.0–46.0)
MCHC: 33.8 g/dL (ref 30.0–36.0)
RDW: 17.6 % — ABNORMAL HIGH (ref 11.5–15.5)

## 2013-04-11 LAB — PROTIME-INR: INR: 2.16 — ABNORMAL HIGH (ref 0.00–1.49)

## 2013-04-11 MED ORDER — HYDRALAZINE HCL 25 MG PO TABS
12.5000 mg | ORAL_TABLET | Freq: Two times a day (BID) | ORAL | Status: DC
  Administered 2013-04-11 – 2013-04-12 (×3): 12.5 mg via ORAL
  Filled 2013-04-11 (×7): qty 0.5

## 2013-04-11 NOTE — Progress Notes (Signed)
ANTICOAGULATION CONSULT NOTE - Follow Up  Pharmacy Consult for Coumadin Indication: atrial fibrillation  Allergies  Allergen Reactions  . Ace Inhibitors Cough  . Food Other (See Comments)    Berries,tomato  . Nutritional Supplements Other (See Comments)    Unknown.  Marland Kitchen Penicillins Swelling  . Simvastatin Other (See Comments)    Myalgia   . Amlodipine Besylate Rash  . Aspirin Rash and Other (See Comments)    GI distress   Vital Signs: Temp: 97.2 F (36.2 C) (09/20 0541) Temp src: Oral (09/20 0541) BP: 136/80 mmHg (09/20 0541) Pulse Rate: 68 (09/20 0541)  Labs:  Recent Labs  04/09/13 1426 04/09/13 1833 04/10/13 04/10/13 0545 04/11/13 0630  HGB 9.4*  --   --  9.2* 8.9*  HCT 28.8*  --   --  27.0* 26.3*  PLT 206  --   --  202 202  LABPROT 23.9*  --   --   --  23.4*  INR 2.22*  --   --   --  2.16*  CREATININE 1.57*  --   --  1.69* 1.72*  TROPONINI  --  <0.30 <0.30 <0.30  --    Estimated Creatinine Clearance: 19.1 ml/min (by C-G formula based on Cr of 1.72).  Medical History: Past Medical History  Diagnosis Date  . CHF (congestive heart failure)     EF 30-35% on 2D Echo in July 2012  . High cholesterol   . Atrial fibrillation     rate controlled, on chronic coumadin therapy  . Sick sinus syndrome     s/p pacemaker  . Chronic kidney disease (CKD), stage III (moderate)   . Anemia     baseline 7.8-9.0  . Coronary artery disease     stent in 2008 shows CABG to LAD, RCA and circumflex with 100% occlusion  . Heart murmur   . Mitral regurgitation     severe MR,EF 30-35%  . Ischemic cardiomyopathy   . Aortic stenosis     severe  . Renal insufficiency, mild    Assessment: 79 year old female on Coumadin PTA for Afib.  INR is therapeutic at 2.16 on home dose of 2.5 mg daily.  Her CBC is stable as well as her platelets.  She continues with gentle diuresis for volume overload.  Creatinine trending higher despite this.  She is without noted bleeding  complications.  Goal of Therapy:  INR 2-3 Monitor platelets by anticoagulation protocol: Yes   Plan:  1)  Continue her home regimen of Coumadin 2.5 mg po daily at 1800 pm 2)  Daily INR   Nadara Mustard, PharmD., MS Clinical Pharmacist Pager:  [phone] Thank you for allowing pharmacy to be part of this patients care team. 04/11/2013,12:18 PM

## 2013-04-11 NOTE — Progress Notes (Signed)
  Echocardiogram 2D Echocardiogram has been performed.  Garrie Elenes FRANCES 04/11/2013, 5:48 PM 

## 2013-04-11 NOTE — Progress Notes (Signed)
[facility]  Subjective: Denies SOB and CP.   Objective: Vital signs in last 24 hours: Temp:  [97.2 F (36.2 C)-98.1 F (36.7 C)] 97.2 F (36.2 C) (09/20 0541) Pulse Rate:  [68-73] 68 (09/20 0541) Resp:  [18-20] 20 (09/20 0541) BP: (128-136)/(74-80) 136/80 mmHg (09/20 0541) SpO2:  [95 %-96 %] 95 % (09/20 0541) Weight:  [135 lb 9.3 oz (61.5 kg)] 135 lb 9.3 oz (61.5 kg) (09/20 0656) Last BM Date: 04/09/13  Intake/Output from previous day: 09/19 0701 - 09/20 0700 In: 959 [P.O.:956; I.V.:3] Out: 1151 [Urine:1150; Stool:1] Intake/Output this shift:    Medications Current Facility-Administered Medications  Medication Dose Route Frequency Provider Last Rate Last Dose  . 0.9 %  sodium chloride infusion  250 mL Intravenous PRN Nada Boozer, NP      . acetaminophen (TYLENOL) tablet 650 mg  650 mg Oral Q4H PRN Nada Boozer, NP      . ALPRAZolam Prudy Feeler) tablet 0.25 mg  0.25 mg Oral BID PRN Nada Boozer, NP      . donepezil (ARICEPT) tablet 5 mg  5 mg Oral QHS Nada Boozer, NP   5 mg at 04/10/13 2129  . ezetimibe (ZETIA) tablet 10 mg  10 mg Oral Daily Nada Boozer, NP   10 mg at 04/11/13 0946  . furosemide (LASIX) injection 40 mg  40 mg Intravenous BID Marykay Lex, MD   40 mg at 04/11/13 0700  . nitroGLYCERIN (NITROGLYN) 2 % ointment 1 inch  1 inch Topical Q6H Mihai Croitoru, MD   1 inch at 04/11/13 0700  . nitroGLYCERIN (NITROSTAT) SL tablet 0.4 mg  0.4 mg Sublingual Q5 min PRN Nada Boozer, NP      . ondansetron [facility]) injection 4 mg  4 mg Intravenous Q6H PRN Nada Boozer, NP      . pantoprazole (PROTONIX) EC tablet 40 mg  40 mg Oral Daily Nada Boozer, NP   40 mg at 04/11/13 0946  . sodium chloride 0.9 % injection 3 mL  3 mL Intravenous Q12H Nada Boozer, NP   3 mL at 04/11/13 0946  . sodium chloride 0.9 % injection 3 mL  3 mL Intravenous PRN Nada Boozer, NP      . warfarin (COUMADIN) tablet 2.5 mg  2.5 mg Oral q1800 Shon Baton, MD   2.5 mg  at 04/10/13 1858  . Warfarin - Pharmacist Dosing Inpatient   Does not apply [id number] Shon Baton, MD        PE: General appearance: alert, cooperative and no distress Lungs: faint basilar rales in the RLL Heart: regular rate and rhythm and 2/6 SM Extremities: 1+ bilateral LEE Pulses: 2+ and symmetric Skin: warm and dry Neurologic: Grossly normal  Lab Results:   Recent Labs  04/09/13 1426 04/10/13 0545 04/11/13 0630  WBC 4.0 4.2 4.4  HGB 9.4* 9.2* 8.9*  HCT 28.8* 27.0* 26.3*  PLT 206 202 202   BMET  Recent Labs  04/09/13 1426 04/10/13 0545 04/11/13 0630  NA 141 144 141  K 3.4* 4.5 3.7  CL 105 110 107  CO2 23 23 22   GLUCOSE 96 94 90  BUN 35* 36* 34*  CREATININE 1.57* 1.69* 1.72*  CALCIUM 9.1 9.1 9.1   PT/INR  Recent Labs  04/09/13 1426 04/11/13 0630  LABPROT 23.9* 23.4*  INR 2.22* 2.16*   BNP (last 3 results)  Recent Labs  09/08/12 0943 04/09/13 1426 04/10/13 0545  PROBNP 8879.0* 9265.0* 10794.0*   Filed Weights  04/09/13 2138 04/10/13 0433 04/11/13 0656  Weight: 137 lb (62.143 kg) 136 lb 11.2 oz (62.007 kg) 135 lb 9.3 oz (61.5 kg)     Assessment/Plan  Principal Problem:   Acute on chronic diastolic congestive heart failure with hypoxia Active Problems:   Coronary artery disease - S/P remote CABG x 4 (1980's)   CARDIOMYOPATHY   Atrial fibrillation, permanent   Chronic diastolic heart failure   Chronic anticoagulation - on Warfarin   Aortic stenosis   Mitral insufficiency   Pacemaker - implanted for SSS ([employer] [facility])   HOH (hard of hearing)  Plan: Very little diuresis despite BID Lasix for the past 2 days. Net output since admission only 192cc. Her weight has only decreased 2 lbs since admission. ? Either increasing dose of adding metolazone to Lasix. Renal function is slightly increased from yesterday at 1.72, however there is still room for more diuretics. Electrolytes are WNL. BP is stable. Continue with strict I/Os, daily weights and  Na+ restriction. ? Fluid restriction as well. A-fib is rate controlled. MD to follow with further recommendations.    LOS: 2 days    Brittainy M. Delmer Islam 04/11/2013 10:55 AM   Patient seen and examined. Agree with assessment and plan. I/O since admission only -192 ml.  Cr. 1.72. She has ischemic cardiomyopathy with severe AS, MR moderate pulmonary HTN and CAD, s/p remote CABG in the '80's. AF rate controlled. Currently on IV lasix 40 bid, would continue present dose with rising Cr and not use metolazone presently. Will try adding very low dose hydralazine at 12.5 bid for additional afterload reduction.  Lennette Bihari, MD, [facility] 04/11/2013 11:37 AM

## 2013-04-11 NOTE — Progress Notes (Signed)
After moving about in bed, pt was noted to have upper airway wheezes, lower breath sounds clear. O2 sat 91% on room air. Placed on 2 l [place] with sat increasing to 98%

## 2013-04-11 NOTE — Progress Notes (Signed)
Pt resting on bed comfortable, no distress noticed.

## 2013-04-12 DIAGNOSIS — Z7901 Long term (current) use of anticoagulants: Secondary | ICD-10-CM

## 2013-04-12 LAB — BASIC METABOLIC PANEL
CO2: 23 mEq/L (ref 19–32)
Calcium: 9.1 mg/dL (ref 8.4–10.5)
Creatinine, Ser: 1.61 mg/dL — ABNORMAL HIGH (ref 0.50–1.10)
Glucose, Bld: 91 mg/dL (ref 70–99)

## 2013-04-12 LAB — CBC
MCH: 31.5 pg (ref 26.0–34.0)
MCHC: 33.6 g/dL (ref 30.0–36.0)
MCV: 93.7 fL (ref 78.0–100.0)
Platelets: 200 10*3/uL (ref 150–400)
RDW: 17.4 % — ABNORMAL HIGH (ref 11.5–15.5)
WBC: 5 10*3/uL (ref 4.0–10.5)

## 2013-04-12 MED ORDER — HYDRALAZINE HCL 25 MG PO TABS
25.0000 mg | ORAL_TABLET | Freq: Two times a day (BID) | ORAL | Status: DC
  Administered 2013-04-12 – 2013-04-16 (×8): 25 mg via ORAL
  Filled 2013-04-12 (×9): qty 1

## 2013-04-12 NOTE — Progress Notes (Signed)
Pt resting on bed comfortable, no distress noticed, no family member at the bedside.

## 2013-04-12 NOTE — Progress Notes (Signed)
Pt resting on bed, denies SOB or pain at this time, no distress noticed, no family member at the bedside.

## 2013-04-12 NOTE — Progress Notes (Signed)
[facility] and [facility] Center  Subjective: No complaints. She denies SOB at rest, but is using [place]. She feels that the swelling in her legs have improved.   Objective: Vital signs in last 24 hours: Temp:  [97.3 F (36.3 C)-98.4 F (36.9 C)] 98.4 F (36.9 C) (09/21 0547) Pulse Rate:  [79-80] 80 (09/21 0547) Resp:  [20] 20 (09/21 0547) BP: (125-135)/(70-76) 125/76 mmHg (09/21 0547) SpO2:  [97 %-100 %] 100 % (09/21 0547) Weight:  [134 lb 6.4 oz (60.963 kg)] 134 lb 6.4 oz (60.963 kg) (09/21 0547) Last BM Date: 04/09/13  Intake/Output from previous day: 09/20 0701 - 09/21 0700 In: 1398 [P.O.:1398] Out: 2575 [Urine:2575] Intake/Output this shift:    Medications Current Facility-Administered Medications  Medication Dose Route Frequency Provider Last Rate Last Dose  . 0.9 %  sodium chloride infusion  250 mL Intravenous PRN Nada Boozer, NP      . acetaminophen (TYLENOL) tablet 650 mg  650 mg Oral Q4H PRN Nada Boozer, NP      . ALPRAZolam Prudy Feeler) tablet 0.25 mg  0.25 mg Oral BID PRN Nada Boozer, NP      . donepezil (ARICEPT) tablet 5 mg  5 mg Oral QHS Nada Boozer, NP   5 mg at 04/11/13 2121  . ezetimibe (ZETIA) tablet 10 mg  10 mg Oral Daily Nada Boozer, NP   10 mg at 04/11/13 0946  . furosemide (LASIX) injection 40 mg  40 mg Intravenous BID Marykay Lex, MD   40 mg at 04/12/13 0525  . hydrALAZINE (APRESOLINE) tablet 12.5 mg  12.5 mg Oral Q12H Lennette Bihari, MD   12.5 mg at 04/11/13 2121  . nitroGLYCERIN (NITROGLYN) 2 % ointment 1 inch  1 inch Topical Q6H Mihai Croitoru, MD   1 inch at 04/12/13 0525  . nitroGLYCERIN (NITROSTAT) SL tablet 0.4 mg  0.4 mg Sublingual Q5 min PRN Nada Boozer, NP      . ondansetron [facility]) injection 4 mg  4 mg Intravenous Q6H PRN Nada Boozer, NP      . pantoprazole (PROTONIX) EC tablet 40 mg  40 mg Oral Daily Nada Boozer, NP   40 mg at 04/11/13 0946  . sodium chloride 0.9 % injection 3 mL  3 mL Intravenous Q12H Nada Boozer, NP   3 mL  at 04/11/13 2123  . sodium chloride 0.9 % injection 3 mL  3 mL Intravenous PRN Nada Boozer, NP      . warfarin (COUMADIN) tablet 2.5 mg  2.5 mg Oral q1800 Shon Baton, MD   2.5 mg at 04/11/13 1745  . Warfarin - Pharmacist Dosing Inpatient   Does not apply [id number] Shon Baton, MD        PE: General appearance: alert, cooperative and no distress Lungs: clear to auscultation bilaterally and + rales in the LLL Heart: irregularly irregular rhythm and 3/6 SM along the LSB Extremities: 1+ pertibial LEE on the left. No edema on the right LE.  Pulses: 2+ and symmetric Skin: warm and dry Neurologic: Grossly normal  Lab Results:   Recent Labs  04/10/13 0545 04/11/13 0630 04/12/13 0500  WBC 4.2 4.4 5.0  HGB 9.2* 8.9* 9.5*  HCT 27.0* 26.3* 28.3*  PLT 202 202 200   BMET  Recent Labs  04/10/13 0545 04/11/13 0630 04/12/13 0500  NA 144 141 141  K 4.5 3.7 3.8  CL 110 107 106  CO2 23 22 23   GLUCOSE 94 90 91  BUN 36* 34* 32*  CREATININE 1.69* 1.72* 1.61*  CALCIUM 9.1 9.1 9.1   PT/INR  Recent Labs  04/09/13 1426 04/11/13 0630 04/12/13 0500  LABPROT 23.9* 23.4* 22.3*  INR 2.22* 2.16* 2.03*   BNP (last 3 results)  Recent Labs  04/09/13 1426 04/10/13 0545 04/12/13 0500  PROBNP 9265.0* 10794.0* 11247.0*   Filed Weights   04/10/13 0433 04/11/13 0656 04/12/13 0547  Weight: 136 lb 11.2 oz (62.007 kg) 135 lb 9.3 oz (61.5 kg) 134 lb 6.4 oz (60.963 kg)    Assessment/Plan  Principal Problem:   Acute on chronic diastolic congestive heart failure with hypoxia Active Problems:   Coronary artery disease - S/P remote CABG x 4 (1980's)   CARDIOMYOPATHY   Atrial fibrillation, permanent   Chronic diastolic heart failure   Chronic anticoagulation - on Warfarin   Aortic stenosis   Mitral insufficiency   Pacemaker - implanted for SSS ([employer] [facility])   HOH (hard of hearing)   Plan: Pt's weight today is 134 1lbs. Dry weight is 114 lbs. BNP is also increased compared to  admission BNP, at 11,247. She has been on 40 mg IV Lasix BID for the past 3 days. Based on yesterday's I/Os, she put out 2.6 L but took in 1.4 L. Net total fluid removal since admission is only 1.3 L. May need to place on fluid restriction to help patient get back to dry weight. SCr is improved since yesterday at 1.61. Can continue with IV Lasix BID. Continue strict I/Os, daily weights and low sodium diet. Continue with hydralazine for afterload reduction. Afib is rate controlled. Will continue to follow.     LOS: 3 days   Brittainy M. Delmer Islam 04/12/2013 8:38 AM  She looks a bit more tired today, but says her breathing has improved. On Exam, edema has improved, but still has prominent JVD -- will still need IV Lasix Tolerated Hydralazine for afterload reduction & had better net output o/n - can up titrate later today. (has ACE-I Allergy)  Is not on BB either -- will wait until more euvolemic  ? If she continues to have slow diuresis, would she actually benefit from inotrope support. (will discuss with Dr. Royann Shivers).  Will try to fluid restrict to 1L-1.2L  INR therapeutic -- no Sx from Afib. On Warfarin H/H stable. - continue PPI  Not on Statin - is on Zetia (? If not listed intolerance)  Marykay Lex, MD

## 2013-04-13 DIAGNOSIS — R079 Chest pain, unspecified: Secondary | ICD-10-CM

## 2013-04-13 DIAGNOSIS — D649 Anemia, unspecified: Secondary | ICD-10-CM | POA: Diagnosis present

## 2013-04-13 DIAGNOSIS — I495 Sick sinus syndrome: Secondary | ICD-10-CM

## 2013-04-13 DIAGNOSIS — I5032 Chronic diastolic (congestive) heart failure: Secondary | ICD-10-CM

## 2013-04-13 LAB — CBC
HCT: 25.7 % — ABNORMAL LOW (ref 36.0–46.0)
MCHC: 33.9 g/dL (ref 30.0–36.0)
MCV: 93.1 fL (ref 78.0–100.0)
RDW: 17.5 % — ABNORMAL HIGH (ref 11.5–15.5)

## 2013-04-13 LAB — BASIC METABOLIC PANEL
CO2: 26 mEq/L (ref 19–32)
Calcium: 8.9 mg/dL (ref 8.4–10.5)
Creatinine, Ser: 1.5 mg/dL — ABNORMAL HIGH (ref 0.50–1.10)
Glucose, Bld: 90 mg/dL (ref 70–99)

## 2013-04-13 MED ORDER — ISOSORBIDE DINITRATE 10 MG PO TABS
10.0000 mg | ORAL_TABLET | Freq: Two times a day (BID) | ORAL | Status: DC
  Administered 2013-04-13 – 2013-04-16 (×7): 10 mg via ORAL
  Filled 2013-04-13 (×9): qty 1

## 2013-04-13 NOTE — Plan of Care (Signed)
Problem: Phase I Progression Outcomes Goal: EF % per last Echo/documented,Core Reminder form on chart Outcome: Completed/Met Date Met:  04/13/13 EF 45 - 50% from 04/11/2013

## 2013-04-13 NOTE — Progress Notes (Signed)
  Subjective:  Less SOB  Objective:  Vital Signs in the last 24 hours: Temp:  [97.6 F (36.4 C)-98.5 F (36.9 C)] 97.6 F (36.4 C) (09/22 0955) Pulse Rate:  [63-86] 63 (09/22 0955) Resp:  [18-20] 18 (09/22 0955) BP: (103-131)/(52-68) 103/52 mmHg (09/22 0955) SpO2:  [94 %-100 %] 100 % (09/22 0955) Weight:  [131 lb 12.8 oz (59.784 kg)] 131 lb 12.8 oz (59.784 kg) (09/22 0543)  Intake/Output from previous day:  Intake/Output Summary (Last 24 hours) at 04/13/13 1054 Last data filed at 04/13/13 0849  Gross per 24 hour  Intake    818 ml  Output    800 ml  Net     18 ml    Physical Exam: General appearance: alert, cooperative and no distress Neck: JVD noted Lungs: basilar crackles bilat Heart: irregularly irregular rhythm and 2/6 systolic murmur   Rate: 65  Rhythm: atrial fibrillation and pacing on demand  Lab Results:  Recent Labs  04/12/13 0500 04/13/13 0440  WBC 5.0 5.0  HGB 9.5* 8.7*  PLT 200 182    Recent Labs  04/12/13 0500 04/13/13 0440  NA 141 143  K 3.8 3.5  CL 106 107  CO2 23 26  GLUCOSE 91 90  BUN 32* 33*  CREATININE 1.61* 1.50*   No results found for this basename: TROPONINI, CK, MB,  in the last 72 hours  Recent Labs  04/13/13 0440  INR 1.99*    Imaging: Imaging results have been reviewed  Cardiac Studies:  Assessment/Plan:   Principal Problem:   Acute on chronic diastolic congestive heart failure with hypoxia Active Problems:   Atrial fibrillation, permanent   Chronic diastolic heart failure   Coronary artery disease - S/P remote CABG x 4 (1980's)   Chronic anticoagulation - on Warfarin   Aortic stenosis- moderate to moderate/ severe 04/11/13   Pacemaker - implanted for SSS ([employer] [facility])   Chronic renal insufficiency, stage III (moderate)   Anemia   TREMOR   HOH (hard of hearing)    PLAN:  I/O equal but her wgt is coming down. See Dr Croitoru's note- estimated dry wgt around 114. Continue IV Lasix. Hgb a little low, check  stools and Iron level.  She will need to be a TCM pt at discharge.    Corine Shelter PA-C [facility] [phone] 04/13/2013, 10:54 AM    Agree with note written by Corine Shelter [facility]  Admitted with volume overload/CHF. IV diuresing. Still appears a little wet with basilar crackles and increased JVP. Scr stable. INR borderline. CAF. Continue IV diuretics for another day or 2 then transition to PO. Agree with TCM 7.    Runell Gess 04/13/2013 3:35 PM

## 2013-04-13 NOTE — Clinical Social Work Psychosocial (Signed)
Clinical Social Work Department BRIEF PSYCHOSOCIAL ASSESSMENT 04/13/2013  Patient:  [name], [name]     Account Number:  [id number]     Admit date:  04/09/2013  Clinical Social Worker:  Lavell Luster  Date/Time:  04/13/2013 01:00 PM  Referred by:  Physician  Date Referred:  04/13/2013 Referred for  SNF Placement   Other Referral:   Interview type:  Patient Other interview type:   Patient's neice was also at bedside.    PSYCHOSOCIAL DATA Living Status:  ALONE Admitted from facility:   Level of care:   Primary support name:  [name] Primary support relationship to patient:  FAMILY Degree of support available:   Suppor is good.    CURRENT CONCERNS Current Concerns  Post-Acute Placement   Other Concerns:    SOCIAL WORK ASSESSMENT / PLAN CSW met with patient to discuss recommendation for SNF placement and the SNF search process. Patient was alert and oriented x4. Patient refuses SNF placement at this time. Patient is from home alone, but neice [name] [phone] states that she will be available to provide 24hr. supervision to patient. The patient's daughter in-law [name] [phone] will also help take care of the patient. Patient does have past SNF experience with [facility] but would like to return home.   Assessment/plan status:  No Further Intervention Required Other assessment/ plan:   Information/referral to community resources:    PATIENT'S/FAMILY'S RESPONSE TO PLAN OF CARE: Patient refused SNF placement and plans to DC home with [facility] services. RNCM notified.    [name], [place], [place], [phone]

## 2013-04-13 NOTE — Progress Notes (Signed)
Pt's Niece visiting states that pt still drives. She is alert but is extremely hard of hearing, For support system niece & granddaughter live approx. A mile away and check on her daily. Pt has no c/o pain

## 2013-04-13 NOTE — Progress Notes (Deleted)
Pt has TED Hose ordered not placed due to swelling and amount of thick dry skin with cream on legs. Has 2 + edema and extremely dry thick cracked skin on bilat lower extremities. Using Eucerin cream to legs and pt has been good about elevating legs in recliner all day.

## 2013-04-13 NOTE — Progress Notes (Signed)
Spoke with patients grand daughter Vicente Serene) this morning.  She indicated that prior to hospitalization the patient was very independent.  She has moved her car back to her home and is able to check on the patient during the day.  Patient's sister, Rozelle Logan, is here from [place] short term and is staying at the patient's residence awaiting her discharge.  Her sister is willing to stay with her at the home until she is stable.  Patient has accepted [facility] services and written consents have been signed.  I was able to communicate with the patient using typed messages on a laptop.  Reached out to the [facility] hospital liaison on 9.19.14 to identify any services that may be available to support the plan of care.  Will attempt to make contact again today.  Patient has accepted [facility] services and written consents have been obtained.  Patient will receive a post discharge transition of care call and will be evaluated for monthly home visits for assessments and disease process education.  Of note, [facility] Care Management services does not replace or interfere with any services that are arranged by inpatient case management or social work.  For additional questions or referrals please contact Anibal Henderson BSN RN [facility] Liaison at [phone].     Grand Daughter Vicente Serene is a secondary contact person if we are unable to reach the patient at home.

## 2013-04-13 NOTE — Progress Notes (Signed)
04/13/13 0835 Remains on IV lasix.  As per MD notes, pt. is still 20lbs above dry weight.  PT rec SNF for rehab.  Will consult CSW for SNF.  Pt. lives alone, however has family to assist. Tera Mater, RN, BSN NCM [phone]

## 2013-04-13 NOTE — Progress Notes (Signed)
Physical Therapy Treatment Patient Details Name: [name] MRN: [id number] DOB: 02-Nov-1917 Today's Date: 04/13/2013 Time: 1478-2956 PT Time Calculation (min): 19 min  PT Assessment / Plan / Recommendation  History of Present Illness  89 YOAAF, woke up feeling worthless, no energy, no chest pain, maybe a little shortness of breath.  She drove to see her PCP and in his office SPO2 was 77% on room air.  She was then was transported by EMS to [facility] for further eval. Here Pro BNP is 9265, K+ low, H/H 9.4/28.8; INR 2.22    PT Comments   Progressing with mobility. O2 sats 95% RA during ambulation.   Follow Up Recommendations  Home health PT;Supervision/Assistance - 24 hour (SNF if family unable to provide this)     Does the patient have the potential to tolerate intense rehabilitation     Barriers to Discharge        Equipment Recommendations  Rolling walker with 5" wheels    Recommendations for Other Services    Frequency Min 3X/week   Progress towards PT Goals Progress towards PT goals: Progressing toward goals  Plan Current plan remains appropriate    Precautions / Restrictions Precautions Precautions: Fall Restrictions Weight Bearing Restrictions: No   Pertinent Vitals/Pain No c/o pain.     Mobility  Bed Mobility Bed Mobility: Supine to Sit Supine to Sit: 5: Supervision;HOB elevated;With rails Transfers Transfers: Sit to Stand;Stand to Sit Sit to Stand: 5: Supervision;From bed Stand to Sit: 5: Supervision;To chair/3-in-1 Ambulation/Gait Ambulation/Gait Assistance: 4: Min guard Ambulation Distance (Feet): 150 Feet Assistive device: Rolling walker Ambulation/Gait Assistance Details: Slow gait speed. narrow BOS intermittently but no LOB. O2 sats 95% RA with ambulation.  Gait Pattern: Step-through pattern;Decreased stride length;Trunk flexed    Exercises     PT Diagnosis:    PT Problem List:   PT Treatment Interventions:     PT Goals (current goals can now be  found in the care plan section)    Visit Information  Last PT Received On: 04/13/13 Assistance Needed: +1 History of Present Illness:  89 YOAAF, woke up feeling worthless, no energy, no chest pain, maybe a little shortness of breath.  She drove to see her PCP and in his office SPO2 was 77% on room air.  She was then was transported by EMS to [facility] for further eval. Here Pro BNP is 9265, K+ low, H/H 9.4/28.8; INR 2.22     Subjective Data      Cognition  Cognition Arousal/Alertness: Awake/Mathis Behavior During Therapy: WFL for tasks assessed/performed Overall Cognitive Status: Within Functional Limits for tasks assessed    Balance     End of Session PT - End of Session Activity Tolerance: Patient tolerated treatment well Patient left: in chair;with call bell/phone within reach   GP     [name], MPT Pager: [phone]

## 2013-04-13 NOTE — Progress Notes (Signed)
ANTICOAGULATION CONSULT NOTE - Follow Up Consult  Pharmacy Consult for warfarin Indication: atrial fibrillation  Allergies  Allergen Reactions  . Ace Inhibitors Cough  . Food Other (See Comments)    Berries,tomato  . Nutritional Supplements Other (See Comments)    Unknown.  Marland Kitchen Penicillins Swelling  . Simvastatin Other (See Comments)    Myalgia   . Amlodipine Besylate Rash  . Aspirin Rash and Other (See Comments)    GI distress    Patient Measurements: Height: 5\' 6"  (167.6 cm) Weight: 131 lb 12.8 oz (59.784 kg) (a scale) IBW/kg (Calculated) : 59.3   Vital Signs: Temp: 97.6 F (36.4 C) (09/22 0955) Temp src: Oral (09/22 0955) BP: 103/52 mmHg (09/22 0955) Pulse Rate: 63 (09/22 0955)  Labs:  Recent Labs  04/11/13 0630 04/12/13 0500 04/13/13 0440  HGB 8.9* 9.5* 8.7*  HCT 26.3* 28.3* 25.7*  PLT 202 200 182  LABPROT 23.4* 22.3* 22.0*  INR 2.16* 2.03* 1.99*  CREATININE 1.72* 1.61* 1.50*    Estimated Creatinine Clearance: 21.9 ml/min (by C-G formula based on Cr of 1.5).   Medications:  Scheduled:  . donepezil  5 mg Oral QHS  . ezetimibe  10 mg Oral Daily  . furosemide  40 mg Intravenous BID  . hydrALAZINE  25 mg Oral Q12H  . isosorbide dinitrate  10 mg Oral BID  . pantoprazole  40 mg Oral Daily  . sodium chloride  3 mL Intravenous Q12H  . warfarin  2.5 mg Oral q1800  . Warfarin - Pharmacist Dosing Inpatient   Does not apply q1800    Assessment: 23 YOF admitted with CHF exacerbation continuing on warfarin for AFib. Her home dose is 2.5mg  daily which has been continued here. Her INR is technically below goal today at 1.99 but d/t age and how stable she has been, will not make changes. CBC has drifted down- to check iron stores per cards note today. No bleeding noted.  Goal of Therapy:  INR 2-3 Monitor platelets by anticoagulation protocol: Yes   Plan:  1. Warfarin 2.5mg  daily as per home dose 2. Follow up INR in the morning  Shanyn Preisler D. Shalaya Swailes,  PharmD Clinical Pharmacist Pager: [phone] 04/13/2013 1:07 PM

## 2013-04-14 ENCOUNTER — Inpatient Hospital Stay (HOSPITAL_COMMUNITY): Payer: Medicare Other

## 2013-04-14 DIAGNOSIS — I472 Ventricular tachycardia: Secondary | ICD-10-CM

## 2013-04-14 LAB — IRON AND TIBC
Iron: 50 ug/dL (ref 42–135)
Saturation Ratios: 16 % — ABNORMAL LOW (ref 20–55)
TIBC: 309 ug/dL (ref 250–470)
UIBC: 259 ug/dL (ref 125–400)

## 2013-04-14 LAB — BASIC METABOLIC PANEL
BUN: 37 mg/dL — ABNORMAL HIGH (ref 6–23)
CO2: 28 mEq/L (ref 19–32)
GFR calc non Af Amer: 27 mL/min — ABNORMAL LOW (ref >90)
Glucose, Bld: 85 mg/dL (ref 70–99)
Potassium: 4 mEq/L (ref 3.5–5.1)

## 2013-04-14 LAB — CBC
HCT: 25.1 % — ABNORMAL LOW (ref 36.0–46.0)
MCH: 31.6 pg (ref 26.0–34.0)
MCV: 94.4 fL (ref 78.0–100.0)
Platelets: 194 10*3/uL (ref 150–400)
RBC: 2.66 MIL/uL — ABNORMAL LOW (ref 3.87–5.11)
RDW: 17.7 % — ABNORMAL HIGH (ref 11.5–15.5)

## 2013-04-14 MED ORDER — BISACODYL 10 MG RE SUPP: 10.0000 mg | Freq: Every day | RECTAL | Status: DC | PRN

## 2013-04-14 MED ORDER — FUROSEMIDE 40 MG PO TABS
40.0000 mg | ORAL_TABLET | Freq: Two times a day (BID) | ORAL | Status: DC
  Administered 2013-04-14 – 2013-04-16 (×5): 40 mg via ORAL
  Filled 2013-04-14 (×8): qty 1

## 2013-04-14 NOTE — Progress Notes (Signed)
Patient has been up to the bedside commode x2 so far during shift. Patient requested to walk. Patient walked with one person assist with the walker down half of hall way and back to the room on 2L of oxygen. Oxygen saturation not available but patient showed no shortness of breath or difficulty walking while ambulating. Currently patient is sitting in recliner and wants to sit up until dinner time. Patient complains of no pain or discomfort. Will continue to monitor to end of shift.

## 2013-04-14 NOTE — Progress Notes (Signed)
Subjective: Feeling better.  Making jokes this morning.  Objective: Vital signs in last 24 hours: Temp:  [96.9 F (36.1 C)-97.9 F (36.6 C)] 96.9 F (36.1 C) (09/23 0505) Pulse Rate:  [46-81] 73 (09/23 0505) Resp:  [18] 18 (09/23 0505) BP: (103-133)/(52-77) 133/55 mmHg (09/23 0505) SpO2:  [95 %-100 %] 100 % (09/23 0505) Weight:  [131 lb 14.4 oz (59.829 kg)] 131 lb 14.4 oz (59.829 kg) (09/23 0505) Last BM Date: 04/10/13  Intake/Output from previous day: 09/22 0701 - 09/23 0700 In: 963 [P.O.:960; I.V.:3] Out: 1050 [Urine:1050] Intake/Output this shift:    Medications Current Facility-Administered Medications  Medication Dose Route Frequency Provider Last Rate Last Dose  . 0.9 %  sodium chloride infusion  250 mL Intravenous PRN Nada Boozer, NP      . acetaminophen (TYLENOL) tablet 650 mg  650 mg Oral Q4H PRN Nada Boozer, NP      . ALPRAZolam Prudy Feeler) tablet 0.25 mg  0.25 mg Oral BID PRN Nada Boozer, NP      . donepezil (ARICEPT) tablet 5 mg  5 mg Oral QHS Nada Boozer, NP   5 mg at 04/13/13 2231  . ezetimibe (ZETIA) tablet 10 mg  10 mg Oral Daily Nada Boozer, NP   10 mg at 04/13/13 4098  . furosemide (LASIX) injection 40 mg  40 mg Intravenous BID Marykay Lex, MD   40 mg at 04/14/13 1191  . hydrALAZINE (APRESOLINE) tablet 25 mg  25 mg Oral Q12H Marykay Lex, MD   25 mg at 04/13/13 2231  . isosorbide dinitrate (ISORDIL) tablet 10 mg  10 mg Oral BID Abelino Derrick, PA-C   10 mg at 04/13/13 2231  . nitroGLYCERIN (NITROSTAT) SL tablet 0.4 mg  0.4 mg Sublingual Q5 min PRN Nada Boozer, NP      . ondansetron [facility]) injection 4 mg  4 mg Intravenous Q6H PRN Nada Boozer, NP      . pantoprazole (PROTONIX) EC tablet 40 mg  40 mg Oral Daily Nada Boozer, NP   40 mg at 04/13/13 0952  . sodium chloride 0.9 % injection 3 mL  3 mL Intravenous Q12H Nada Boozer, NP   3 mL at 04/13/13 2231  . sodium chloride 0.9 % injection 3 mL  3 mL Intravenous PRN Nada Boozer, NP      .  warfarin (COUMADIN) tablet 2.5 mg  2.5 mg Oral q1800 Shon Baton, MD   2.5 mg at 04/13/13 1752  . Warfarin - Pharmacist Dosing Inpatient   Does not apply [id number] Shon Baton, MD        PE: General appearance: alert, cooperative and no distress Neck: +JVD Lungs: Mild rales Heart: irregularly irregular rhythm and 2/6 Sys MM Abdomen: Abd tight and distended Extremities: No LEE Pulses: 2+ and symmetric 1+ DPs. Skin: Warm and dry. Neurologic: Grossly normal  Lab Results:   Recent Labs  04/12/13 0500 04/13/13 0440 04/14/13 0605  WBC 5.0 5.0 4.8  HGB 9.5* 8.7* 8.4*  HCT 28.3* 25.7* 25.1*  PLT 200 182 194   BMET  Recent Labs  04/12/13 0500 04/13/13 0440 04/14/13 0605  NA 141 143 145  K 3.8 3.5 4.0  CL 106 107 107  CO2 23 26 28   GLUCOSE 91 90 85  BUN 32* 33* 37*  CREATININE 1.61* 1.50* 1.58*  CALCIUM 9.1 8.9 9.2   PT/INR  Recent Labs  04/12/13 0500 04/13/13 0440 04/14/13 0605  LABPROT 22.3* 22.0* 23.3*  INR  2.03* 1.99* 2.15*   Cholesterol No results found for this basename: CHOL,  in the last 72 hours Cardiac Enzymes No components found with this basename: TROPONIN,  CKMB,   Studies/Results: Echo Study Conclusions  - Left ventricle: The cavity size was dilated. There was hypertrophy, with an appearance of moderate eccentric hypertrophy. Systolic function was mildly reduced. The estimated ejection fraction was 45%, in the range of 45% to 50%. Doppler parameters are consistent with restrictive physiology, indicative of decreased left ventricular diastolic compliance and/or increased left atrial pressure. Doppler parameters are consistent with both elevated ventricular end-diastolic filling pressure and elevated left atrial filling pressure. - Ventricular septum: Septal motion showed abnormal function and dyssynergy. The contour showed diastolic flattening and systolic flattening. These changes are consistent with right ventricular pacing.  These changes are also consistent with RV pressure overload. - Aortic valve: Severe thickening, calcification, and nodularity. Cusp separation was moderately reduced. Valve mobility was moderately restricted. Transvalvular velocity was increased, due to stenosis. There was moderate (borderline moderate to severe) stenosis. Visually, the Righ cusp does appear to move. The transvalvular velocities do not correlate well with the estimated vavle area of ~0.7 cm2, Mild regurgitation. Valve area: 0.6cm^2(VTI). Valve area: 0.69cm^2 (Vmax). - Mitral valve: Calcified annulus. Mildly thickened leaflets . Moderate regurgitation directed posteriorly and toward the free wall. - Left atrium: The atrium was massively dilated. - Right ventricle: The cavity size was moderately dilated. Wall thickness was normal. Systolic function was mildly reduced. - Right atrium: The atrium was massively dilated. - Tricuspid valve: Wide-open regurgitation originating from the central coaptation point and directed centrally. - Pulmonary arteries: PA peak pressure: 83mm Hg (S). - Pericardium, extracardiac: There was a left pleural effusion. Impressions:  - An atrial fibrillation rhythm was noted on this study, making this study technically difficult for the assessment of cardiac function. The right ventricular systolic pressure was increased consistent with severe pulmonary hypertension.    Assessment/Plan    Principal Problem:   Acute on chronic diastolic congestive heart failure with hypoxia Active Problems:   Atrial fibrillation, permanent   Chronic diastolic heart failure   TREMOR   Coronary artery disease - S/P remote CABG x 4 (1980's)   Chronic anticoagulation - on Warfarin   Aortic stenosis- moderate to moderate/ severe 04/11/13   Pacemaker - implanted for SSS ([facility])   Chronic renal insufficiency, stage III (moderate)   HOH (hard of hearing)   Anemia  Plan:  Net fluids: -64ml/-1.4L.  BNP  has gone up from 11247 to 12470 and has been steadily going up.  Weight has come down from 136 to 131. SCr stable.  Hgb up and down.  Last 8.4.  Check stool guaiacs(There has been an order since 9/18).   Last BM 9/19 according to chart.  +JVD may be a function of MR/AS.   Continue IV lasix today.  CXR today.    LOS: 5 days    HAGER, BRYAN 04/14/2013  Agree with note written by [facility]  Admitted with CHF. CAF on coumadin A/C/ AS, MR. Only down 1.7 liters since admission on lasix 40 mg iv BID (on 20 mg PO lasix q d at home). Lungs with scattered RLL crackles. No periph edema.  JVP up. She says she is weak but SOB improved. Abd distended. Will change diuretics to PO. Stool softener. Not sure we are going to get much more off of her. Prob home tomorrow TCM7. Can add zaroxolyn as OP.   Runell Gess  04/14/2013 9:44 AM     8:22 AM

## 2013-04-14 NOTE — Progress Notes (Signed)
ANTICOAGULATION CONSULT NOTE - Follow Up Consult  Pharmacy Consult for warfarin Indication: atrial fibrillation  Allergies  Allergen Reactions  . Ace Inhibitors Cough  . Food Other (See Comments)    Berries,tomato  . Nutritional Supplements Other (See Comments)    Unknown.  Marland Kitchen Penicillins Swelling  . Simvastatin Other (See Comments)    Myalgia   . Amlodipine Besylate Rash  . Aspirin Rash and Other (See Comments)    GI distress    Patient Measurements: Height: 5\' 6"  (167.6 cm) Weight: 131 lb 14.4 oz (59.829 kg) (scale A) IBW/kg (Calculated) : 59.3   Vital Signs: Temp: 96.9 F (36.1 C) (09/23 0505) Temp src: Oral (09/23 0505) BP: 133/55 mmHg (09/23 0505) Pulse Rate: 73 (09/23 0505)  Labs:  Recent Labs  04/12/13 0500 04/13/13 0440 04/14/13 0605  HGB 9.5* 8.7* 8.4*  HCT 28.3* 25.7* 25.1*  PLT 200 182 194  LABPROT 22.3* 22.0* 23.3*  INR 2.03* 1.99* 2.15*  CREATININE 1.61* 1.50* 1.58*    Estimated Creatinine Clearance: 20.8 ml/min (by C-G formula based on Cr of 1.58).   Medications:  Scheduled:  . donepezil  5 mg Oral QHS  . ezetimibe  10 mg Oral Daily  . furosemide  40 mg Oral BID  . hydrALAZINE  25 mg Oral Q12H  . isosorbide dinitrate  10 mg Oral BID  . pantoprazole  40 mg Oral Daily  . sodium chloride  3 mL Intravenous Q12H  . warfarin  2.5 mg Oral q1800  . Warfarin - Pharmacist Dosing Inpatient   Does not apply q1800    Assessment: 77 YOF admitted with CHF exacerbation continuing on warfarin for AFib. Her home dose is 2.5mg  daily which has been continued here. INR has been stable and at goal which continues today. Hgb continues to drift down- stool guaiac needs to be checked. Iron stores adequate as per labs from 9/18. Platelets are stable. No overt bleeding noted.  Goal of Therapy:  INR 2-3 Monitor platelets by anticoagulation protocol: Yes   Plan:  1. Warfarin 2.5mg  daily as per home dose 2. MWF INR starting tomorrow  Leotis Shames D. Angelito Hopping,  PharmD Clinical Pharmacist Pager: [phone] 04/14/2013 11:46 AM

## 2013-04-14 NOTE — Progress Notes (Signed)
The patient did not have any complaints of pain this morning and did not have any acute changes overnight.

## 2013-04-15 DIAGNOSIS — D649 Anemia, unspecified: Secondary | ICD-10-CM

## 2013-04-15 LAB — CBC
MCV: 95.7 fL (ref 78.0–100.0)
Platelets: 201 10*3/uL (ref 150–400)
RBC: 2.81 MIL/uL — ABNORMAL LOW (ref 3.87–5.11)
RDW: 17.5 % — ABNORMAL HIGH (ref 11.5–15.5)
WBC: 4.8 10*3/uL (ref 4.0–10.5)

## 2013-04-15 LAB — PROTIME-INR
INR: 2 — ABNORMAL HIGH (ref 0.00–1.49)
Prothrombin Time: 22.1 seconds — ABNORMAL HIGH (ref 11.6–15.2)

## 2013-04-15 LAB — BASIC METABOLIC PANEL
BUN: 38 mg/dL — ABNORMAL HIGH (ref 6–23)
CO2: 24 mEq/L (ref 19–32)
Calcium: 9.3 mg/dL (ref 8.4–10.5)
GFR calc non Af Amer: 25 mL/min — ABNORMAL LOW (ref >90)
Glucose, Bld: 110 mg/dL — ABNORMAL HIGH (ref 70–99)
Sodium: 143 mEq/L (ref 135–145)

## 2013-04-15 MED ORDER — WARFARIN SODIUM 3 MG PO TABS
3.0000 mg | ORAL_TABLET | Freq: Every day | ORAL | Status: DC
  Administered 2013-04-15: 19:00:00 3 mg via ORAL
  Filled 2013-04-15 (×2): qty 1

## 2013-04-15 MED ORDER — FUROSEMIDE 10 MG/ML IJ SOLN
40.0000 mg | Freq: Once | INTRAMUSCULAR | Status: AC
  Administered 2013-04-15: 40 mg via INTRAVENOUS
  Filled 2013-04-15: qty 4

## 2013-04-15 NOTE — Progress Notes (Signed)
ANTICOAGULATION CONSULT NOTE - Follow Up Consult  Pharmacy Consult for warfarin Indication: atrial fibrillation  Allergies  Allergen Reactions  . Ace Inhibitors Cough  . Food Other (See Comments)    Berries,tomato  . Nutritional Supplements Other (See Comments)    Unknown.  Marland Kitchen Penicillins Swelling  . Simvastatin Other (See Comments)    Myalgia   . Amlodipine Besylate Rash  . Aspirin Rash and Other (See Comments)    GI distress    Patient Measurements: Height: 5\' 6"  (167.6 cm) Weight: 132 lb 6.4 oz (60.056 kg) (SCALE A) IBW/kg (Calculated) : 59.3   Vital Signs: Temp: 97.9 F (36.6 C) (09/24 0900) Temp src: Oral (09/24 0900) BP: 121/56 mmHg (09/24 0959) Pulse Rate: 73 (09/24 0900)  Labs:  Recent Labs  04/13/13 0440 04/14/13 0605 04/15/13 0948  HGB 8.7* 8.4* 8.8*  HCT 25.7* 25.1* 26.9*  PLT 182 194 201  LABPROT 22.0* 23.3* 22.1*  INR 1.99* 2.15* 2.00*  CREATININE 1.50* 1.58* 1.66*    Estimated Creatinine Clearance: 19.8 ml/min (by C-G formula based on Cr of 1.66).   Medications:  Scheduled:  . donepezil  5 mg Oral QHS  . ezetimibe  10 mg Oral Daily  . furosemide  40 mg Oral BID  . hydrALAZINE  25 mg Oral Q12H  . isosorbide dinitrate  10 mg Oral BID  . pantoprazole  40 mg Oral Daily  . sodium chloride  3 mL Intravenous Q12H  . warfarin  2.5 mg Oral q1800  . Warfarin - Pharmacist Dosing Inpatient   Does not apply q1800    Assessment: 9 YOF admitted with CHF exacerbation continuing on warfarin for AFib. Her home dose is 2.5mg  daily which has been continued here. INR has been stable and at goal which continues today. Hgb is low but has relatively stable. No overt bleeding noted, though a stool guaiac does need to be completed (no BM charted). She was started on MWF INR checks today.  Goal of Therapy:  INR 2-3 Monitor platelets by anticoagulation protocol: Yes   Plan:  1. Warfarin 3mg  daily as her INR has been on the low end of therapeutic range 2.  MWF INR  Johnita Palleschi D. Aneita Kiger, PharmD Clinical Pharmacist Pager: [phone] 04/15/2013 1:08 PM

## 2013-04-15 NOTE — Progress Notes (Signed)
Ambulated well off 02 in hallway with PT. Sat in chair for good part of day with repositioning q 2 hr and legs elevated. Had 1 dose of IV lasix 40 mg today. Her sister came to visit earlier today. Pt very HOH has Bilat hearing aids.

## 2013-04-15 NOTE — Progress Notes (Signed)
Occupational Therapy Treatment Patient Details Name: [name] MRN: [id number] DOB: 1917-09-22 Today's Date: 04/15/2013 Time: 7829-5621 OT Time Calculation (min): 25 min  OT Assessment / Plan / Recommendation  History of present illness  75 YOAAF, woke up feeling worthless, no energy, no chest pain, maybe a little shortness of breath.  She drove to see her PCP and in his office SPO2 was 77% on room air.  She was then was transported by EMS to [facility] for further eval. Here Pro BNP is 9265, K+ low, H/H 9.4/28.8; INR 2.22    OT comments  Pt progressing towards POC/goals, however she continues to be limited by fatigue and SOB during ADL's and functional mobility. She would benefit from possible SNF Rehab if agreeable, if not, will need 24/7 supervision/assist.  Follow Up Recommendations  SNF    Barriers to Discharge       Equipment Recommendations  3 in 1 bedside comode    Recommendations for Other Services    Frequency Min 2X/week   Progress towards OT Goals Progress towards OT goals: Progressing toward goals  Plan Discharge plan remains appropriate    Precautions / Restrictions Precautions Precautions: Fall Restrictions Weight Bearing Restrictions: No   Pertinent Vitals/Pain No c/o, denies pain.    ADL  Grooming: Performed;Wash/dry hands;Wash/dry face;Teeth care;Supervision/safety;Set up Where Assessed - Grooming: Supported standing Upper Body Bathing: Performed;Chest;Right arm;Left arm;Abdomen;Supervision/safety;Set up Where Assessed - Upper Body Bathing: Supported standing;Unsupported standing Lower Body Bathing: Performed;Set up;Min guard Where Assessed - Lower Body Bathing: Supported sit to stand Upper Body Dressing: Performed;Set up Where Assessed - Upper Body Dressing: Unsupported sitting Lower Body Dressing: Performed;Set up;Supervision/safety Where Assessed - Lower Body Dressing: Supported sit to stand Toilet Transfer: Simulated;Minimal assistance Toilet  Transfer Method: Sit to Barista: Bedside commode (Simulated tranfer to chair/pt declined use of 3:1) Equipment Used: Gait belt;Rolling walker Transfers/Ambulation Related to ADLs: Pt was CGA w/ RW  in room during ADL's and functional mobility. She fatigues quickly and required 2 sitting rest breaks during ADL's. ADL Comments: Pt performed ADL's at sink today in both standing and sitting. Pt generally deconditioned and requires rest breaks/asssit PRN. Discussed possible short term rehab w/ pt, she currently declines stating she would prefer to go home.    OT Diagnosis:    OT Problem List:   OT Treatment Interventions:     OT Goals(current goals can now be found in the care plan section)    Visit Information  Last OT Received On: 04/15/13 Assistance Needed: +1 History of Present Illness:  89 YOAAF, woke up feeling worthless, no energy, no chest pain, maybe a little shortness of breath.  She drove to see her PCP and in his office SPO2 was 77% on room air.  She was then was transported by EMS to [facility] for further eval. Here Pro BNP is 9265, K+ low, H/H 9.4/28.8; INR 2.22     Subjective Data      Prior Functioning       Cognition  Cognition Arousal/Alertness: Awake/alert Behavior During Therapy: WFL for tasks assessed/performed Overall Cognitive Status: Within Functional Limits for tasks assessed    Mobility  Bed Mobility Bed Mobility: Supine to Sit;Sitting - Scoot to Edge of Bed Supine to Sit: 5: Supervision;HOB elevated;With rails Sitting - Scoot to Edge of Bed: 5: Supervision;With rail Transfers Transfers: Sit to Stand;Stand to Sit Sit to Stand: 5: Supervision;With upper extremity assist;With armrests;From chair/3-in-1;From bed Stand to Sit: 5: Supervision;With upper extremity assist;With armrests;To chair/3-in-1 Details  for Transfer Assistance: cues for hand placement         Balance Balance Balance Assessed: Yes Static Sitting Balance Static  Sitting - Balance Support: Feet supported Static Sitting - Level of Assistance: 5: Stand by assistance   End of Session OT - End of Session Equipment Utilized During Treatment: Gait belt;Rolling walker Activity Tolerance: Patient limited by fatigue Patient left: in chair;with call bell/phone within reach;with family/visitor present Nurse Communication: Mobility status  GO     Alm Bustard 04/15/2013, 10:55 AM

## 2013-04-15 NOTE — Progress Notes (Signed)
Subjective: No complaints.  Objective: Vital signs in last 24 hours: Temp:  [96.8 F (36 C)-97.9 F (36.6 C)] 96.8 F (36 C) (09/24 0529) Pulse Rate:  [69-77] 77 (09/24 0529) Resp:  [18] 18 (09/24 0529) BP: (107-130)/(50-64) 121/56 mmHg (09/24 0959) SpO2:  [99 %-100 %] 100 % (09/24 0529) Weight:  [132 lb 6.4 oz (60.056 kg)] 132 lb 6.4 oz (60.056 kg) (09/24 0529) Last BM Date: 04/14/13  Intake/Output from previous day: 09/23 0701 - 09/24 0700 In: 720 [P.O.:720] Out: 500 [Urine:500] Intake/Output this shift:    Medications Current Facility-Administered Medications  Medication Dose Route Frequency Provider Last Rate Last Dose  . 0.9 %  sodium chloride infusion  250 mL Intravenous PRN Nada Boozer, NP      . acetaminophen (TYLENOL) tablet 650 mg  650 mg Oral Q4H PRN Nada Boozer, NP      . ALPRAZolam Prudy Feeler) tablet 0.25 mg  0.25 mg Oral BID PRN Nada Boozer, NP      . bisacodyl (DULCOLAX) suppository 10 mg  10 mg Rectal Daily PRN Runell Gess, MD      . donepezil (ARICEPT) tablet 5 mg  5 mg Oral QHS Nada Boozer, NP   5 mg at 04/14/13 2316  . ezetimibe (ZETIA) tablet 10 mg  10 mg Oral Daily Nada Boozer, NP   10 mg at 04/15/13 0958  . furosemide (LASIX) tablet 40 mg  40 mg Oral BID Runell Gess, MD   40 mg at 04/15/13 0957  . hydrALAZINE (APRESOLINE) tablet 25 mg  25 mg Oral Q12H Marykay Lex, MD   25 mg at 04/15/13 0959  . isosorbide dinitrate (ISORDIL) tablet 10 mg  10 mg Oral BID Eda Paschal Kilroy, PA-C   10 mg at 04/15/13 1000  . nitroGLYCERIN (NITROSTAT) SL tablet 0.4 mg  0.4 mg Sublingual Q5 min PRN Nada Boozer, NP      . ondansetron [facility] injection 4 mg  4 mg Intravenous Q6H PRN Nada Boozer, NP      . pantoprazole (PROTONIX) EC tablet 40 mg  40 mg Oral Daily Nada Boozer, NP   40 mg at 04/15/13 1024  . sodium chloride 0.9 % injection 3 mL  3 mL Intravenous Q12H Nada Boozer, NP   3 mL at 04/15/13 1000  . sodium chloride 0.9 % injection 3 mL  3 mL  Intravenous PRN Nada Boozer, NP      . warfarin (COUMADIN) tablet 2.5 mg  2.5 mg Oral q1800 Shon Baton, MD   2.5 mg at 04/14/13 1932  . Warfarin - Pharmacist Dosing Inpatient   Does not apply [id number] Shon Baton, MD        PE: General appearance: alert, cooperative and no distress  Neck: +JVD  Lungs: Mild rales  Heart: irregularly irregular rhythm and 2/6 Sys MM  Abdomen: Abd  Distended, Nontender Extremities: Trace left LEE, none on the right Pulses: 2+ and symmetric, 1+ DPs.  Skin: Warm and dry.  Neurologic: Grossly normal   Lab Results:   Recent Labs  04/13/13 0440 04/14/13 0605  WBC 5.0 4.8  HGB 8.7* 8.4*  HCT 25.7* 25.1*  PLT 182 194   BMET  Recent Labs  04/13/13 0440 04/14/13 0605  NA 143 145  K 3.5 4.0  CL 107 107  CO2 26 28  GLUCOSE 90 85  BUN 33* 37*  CREATININE 1.50* 1.58*  CALCIUM 8.9 9.2   PT/INR  Recent Labs  04/13/13 0440 04/14/13  0605  LABPROT 22.0* 23.3*  INR 1.99* 2.15*    Assessment/Plan  Principal Problem:   Acute on chronic diastolic congestive heart failure with hypoxia Active Problems:   Atrial fibrillation, permanent   Chronic diastolic heart failure   TREMOR   Coronary artery disease - S/P remote CABG x 4 (1980's)   Chronic anticoagulation - on Warfarin   Aortic stenosis- moderate to moderate/ severe 04/11/13   Pacemaker - implanted for SSS ([facility])   Chronic renal insufficiency, stage III (moderate)   HOH (hard of hearing)   Anemia  Plan:  Net fluids:  +23ml/-1.1L.   Diuresis has not been very good after four days of IV lasix.  BNP trending up and still has +JVD.  Wt down from 136 to 132#.   Labs pending.  Take off O2 and check saturation.  Ambulate in hall.  INR therapeutic.  Will give IV lasix 40mg  x one this AM.     LOS: 6 days    HAGER, BRYAN 04/15/2013 10:31 AM    Patient seen and examined. Agree with assessment and plan. No complaints of any shortness of breath or chest pain. Agree with IV  lasix today.   Lennette Bihari, MD, [facility] 04/15/2013 5:24 PM

## 2013-04-15 NOTE — Progress Notes (Signed)
Physical Therapy Treatment Patient Details Name: SOPHONIE GOFORTH MRN: [id number] DOB: [birth year] Today's Date: 04/15/2013 Time: 1000-1016 PT Time Calculation (min): 16 min  PT Assessment / Plan / Recommendation  History of Present Illness  89 YOAAF, woke up feeling worthless, no energy, no chest pain, maybe a little shortness of breath.  She drove to see her PCP and in his office SPO2 was 77% on room air.  She was then was transported by EMS to [facility] for further eval. Here Pro BNP is 9265, K+ low, H/H 9.4/28.8; INR 2.22    PT Comments   Pt tolerated increased ambulation distance well.  Motivated to increase mobility.  Very HOH but does well if speak directly in left ear.  Follow Up Recommendations  Home health PT;Supervision/Assistance - 24 hour     Does the patient have the potential to tolerate intense rehabilitation     Barriers to Discharge        Equipment Recommendations  Rolling walker with 5" wheels    Recommendations for Other Services    Frequency Min 3X/week   Progress towards PT Goals Progress towards PT goals: Progressing toward goals  Plan Current plan remains appropriate    Precautions / Restrictions Precautions Precautions: Fall Restrictions Weight Bearing Restrictions: No   Pertinent Vitals/Pain No c/o pain.  SaO2 94% after ambulating on RA.    Mobility  Bed Mobility Bed Mobility: Not assessed Transfers Transfers: Sit to Stand;Stand to Sit Sit to Stand: 5: Supervision;With upper extremity assist;With armrests;From chair/3-in-1 Stand to Sit: 5: Supervision;With upper extremity assist;With armrests;To chair/3-in-1 Details for Transfer Assistance: cues for hand placement Ambulation/Gait Ambulation/Gait Assistance: 4: Min guard Ambulation Distance (Feet): 220 Feet Assistive device: Rolling walker Ambulation/Gait Assistance Details: No physical assist needed to guide RW.  Needed cues to avoid several objects in hall. Gait Pattern: Step-through  pattern;Decreased stride length Stairs: No         PT Goals (current goals can now be found in the care plan section)    Visit Information  Last PT Received On: 04/15/13 Assistance Needed: +1 History of Present Illness:  89 YOAAF, woke up feeling worthless, no energy, no chest pain, maybe a little shortness of breath.  She drove to see her PCP and in his office SPO2 was 77% on room air.  She was then was transported by EMS to [facility] for further eval. Here Pro BNP is 9265, K+ low, H/H 9.4/28.8; INR 2.22     Subjective Data  Subjective: "That was the furthest I've walked.  I feel like I walked 10 miles."   Cognition  Cognition Arousal/Alertness: Awake/alert Behavior During Therapy: WFL for tasks assessed/performed Overall Cognitive Status: Within Functional Limits for tasks assessed    Balance     End of Session PT - End of Session Equipment Utilized During Treatment: Gait belt Activity Tolerance: Patient tolerated treatment well Patient left: in chair;with call bell/phone within reach;with nursing/sitter in room Nurse Communication: Mobility status   Newell Coral, [place] [facility] [phone]  Newell Coral 04/15/2013, 10:32 AM

## 2013-04-15 NOTE — Progress Notes (Signed)
Pt. Resting in bed quietly during the night. No s/s of distress or discomfort noted. No c/o pain. Call light within reach of pt. RN will continue to monitor pt. For changes in condition.Blood pressure 107/64, pulse 77, temperature 96.8 F (36 C), temperature source Oral, resp. rate 18, height 5\' 6"  (1.676 m), weight 60.056 kg (132 lb 6.4 oz), SpO2 100.00%.  Elinor Kleine, Cheryll Dessert

## 2013-04-16 DIAGNOSIS — H9190 Unspecified hearing loss, unspecified ear: Secondary | ICD-10-CM

## 2013-04-16 LAB — BASIC METABOLIC PANEL
BUN: 41 mg/dL — ABNORMAL HIGH (ref 6–23)
Chloride: 106 mEq/L (ref 96–112)
Creatinine, Ser: 1.65 mg/dL — ABNORMAL HIGH (ref 0.50–1.10)
GFR calc Af Amer: 30 mL/min — ABNORMAL LOW (ref >90)
GFR calc non Af Amer: 26 mL/min — ABNORMAL LOW (ref >90)
Glucose, Bld: 95 mg/dL (ref 70–99)
Potassium: 3.9 mEq/L (ref 3.5–5.1)
Sodium: 144 mEq/L (ref 135–145)

## 2013-04-16 LAB — CBC
HCT: 26.3 % — ABNORMAL LOW (ref 36.0–46.0)
Hemoglobin: 8.6 g/dL — ABNORMAL LOW (ref 12.0–15.0)
MCHC: 32.7 g/dL (ref 30.0–36.0)
MCV: 95.6 fL (ref 78.0–100.0)
RDW: 17.6 % — ABNORMAL HIGH (ref 11.5–15.5)

## 2013-04-16 MED ORDER — HYDRALAZINE HCL 25 MG PO TABS: 25.0000 mg | ORAL_TABLET | Freq: Two times a day (BID) | ORAL | Status: DC

## 2013-04-16 MED ORDER — ISOSORBIDE DINITRATE 10 MG PO TABS: 10.0000 mg | ORAL_TABLET | Freq: Two times a day (BID) | ORAL | Status: DC

## 2013-04-16 MED ORDER — FUROSEMIDE 40 MG PO TABS: 40.0000 mg | ORAL_TABLET | Freq: Every day | ORAL | Status: DC

## 2013-04-16 NOTE — Progress Notes (Signed)
Pt discharged per w/c with all belongings.Accomapnied by family & volunteer. Pt has d/c info and follow up appts and home med schedule and when to take next. HH to follow up with pt.

## 2013-04-16 NOTE — Progress Notes (Signed)
Subjective: Feels a lot better than when she came in.  Objective: Vital signs in last 24 hours: Temp:  [97.4 F (36.3 C)-98.4 F (36.9 C)] 97.4 F (36.3 C) (09/25 0607) Pulse Rate:  [68-83] 75 (09/25 0607) Resp:  [16-18] 18 (09/25 0607) BP: (109-131)/(55-75) 109/58 mmHg (09/25 0607) SpO2:  [98 %-100 %] 98 % (09/25 0607) Weight:  [131 lb 6.4 oz (59.603 kg)] 131 lb 6.4 oz (59.603 kg) (09/25 0607) Last BM Date: 04/16/13  Intake/Output from previous day: 09/24 0701 - 09/25 0700 In: 715 [P.O.:715] Out: 700 [Urine:700] Intake/Output this shift: Total I/O In: 240 [P.O.:240] Out: -   Medications Current Facility-Administered Medications  Medication Dose Route Frequency Provider Last Rate Last Dose  . 0.9 %  sodium chloride infusion  250 mL Intravenous PRN Nada Boozer, NP      . acetaminophen (TYLENOL) tablet 650 mg  650 mg Oral Q4H PRN Nada Boozer, NP      . ALPRAZolam Prudy Feeler) tablet 0.25 mg  0.25 mg Oral BID PRN Nada Boozer, NP      . bisacodyl (DULCOLAX) suppository 10 mg  10 mg Rectal Daily PRN Runell Gess, MD      . donepezil (ARICEPT) tablet 5 mg  5 mg Oral QHS Nada Boozer, NP   5 mg at 04/15/13 2204  . ezetimibe (ZETIA) tablet 10 mg  10 mg Oral Daily Nada Boozer, NP   10 mg at 04/15/13 0958  . furosemide (LASIX) tablet 40 mg  40 mg Oral BID Runell Gess, MD   40 mg at 04/15/13 1900  . hydrALAZINE (APRESOLINE) tablet 25 mg  25 mg Oral Q12H Marykay Lex, MD   25 mg at 04/15/13 2204  . isosorbide dinitrate (ISORDIL) tablet 10 mg  10 mg Oral BID Abelino Derrick, PA-C   10 mg at 04/15/13 2204  . nitroGLYCERIN (NITROSTAT) SL tablet 0.4 mg  0.4 mg Sublingual Q5 min PRN Nada Boozer, NP      . ondansetron [facility]) injection 4 mg  4 mg Intravenous Q6H PRN Nada Boozer, NP      . pantoprazole (PROTONIX) EC tablet 40 mg  40 mg Oral Daily Nada Boozer, NP   40 mg at 04/15/13 1024  . sodium chloride 0.9 % injection 3 mL  3 mL Intravenous Q12H Nada Boozer, NP   3 mL at  04/15/13 2205  . sodium chloride 0.9 % injection 3 mL  3 mL Intravenous PRN Nada Boozer, NP      . warfarin (COUMADIN) tablet 3 mg  3 mg Oral q1800 Lauren Bajbus, RPH   3 mg at 04/15/13 1900  . Warfarin - Pharmacist Dosing Inpatient   Does not apply [id number] Shon Baton, MD        PE: General appearance: alert, cooperative and no distress  Neck: +JVD  Lungs: Bilateral wheeze. Heart: irregularly irregular rhythm and 2/6 Sys MM  Abdomen: Abd Distended, Nontender  Extremities: Trace left LEE, none on the right  Pulses: 2+ and symmetric, 1+ DPs.  Skin: Warm and dry.  Neurologic: Grossly normal  Lab Results:   Recent Labs  04/14/13 0605 04/15/13 0948 04/16/13 0625  WBC 4.8 4.8 4.3  HGB 8.4* 8.8* 8.6*  HCT 25.1* 26.9* 26.3*  PLT 194 201 188   BMET  Recent Labs  04/14/13 0605 04/15/13 0948 04/16/13 0625  NA 145 143 144  K 4.0 3.8 3.9  CL 107 106 106  CO2 28 24 27   GLUCOSE 85  110* 95  BUN 37* 38* 41*  CREATININE 1.58* 1.66* 1.65*  CALCIUM 9.2 9.3 9.3   PT/INR  Recent Labs  04/14/13 0605 04/15/13 0948  LABPROT 23.3* 22.1*  INR 2.15* 2.00*    Assessment/Plan  Principal Problem:   Acute on chronic diastolic congestive heart failure with hypoxia Active Problems:   Atrial fibrillation, permanent   Chronic diastolic heart failure   TREMOR   Coronary artery disease - S/P remote CABG x 4 (1980's)   Chronic anticoagulation - on Warfarin   Aortic stenosis- moderate to moderate/ severe 04/11/13   Pacemaker - implanted for SSS ([employer])   Chronic renal insufficiency, stage III (moderate)   HOH (hard of hearing)   Anemia  Plan:  Net fluids:  +68ml/-1.1L.  I gave A dose of IV lasix 40mg  yesterday with little change in net fluids.  RN said all output may not have been caught and that she did diurese after IV dose.  Wt 136 to 131 today.  SCr stable.    OT recommends SNF short term or 24/7 supervision.  She has family that checks on her regularly.  I think she can  be DCd.    She should not be driving any more!     LOS: 7 days    [name] 04/16/2013 9:35 AM

## 2013-04-16 NOTE — Progress Notes (Signed)
Went over all discharge Instructions with pt and family because she is very HOH.

## 2013-04-16 NOTE — Progress Notes (Signed)
Pt. Seen and examined. Agree with the NP/PA-C note as written.  Feels better .Marland Kitchen Says she was never really "short of breath", but was tired and is not as tired today. Weight has come down significantly. Lungs clear. I agree with discharging her home today on home lasix (perhaps a full tablet of 40 mg daily).  Follow-up with MLP Annie Paras) or Dr. Royann Shivers in 5-7 days.  Chrystie Nose, MD, [facility] Attending Cardiologist [facility]

## 2013-04-16 NOTE — Progress Notes (Signed)
04/16/13 1345 In to speak with pt. about home health services.  PT rec SNF, however pt. does not want to go to SNF.  Offerred home health agency list, and pt.'s granddaughter chose [facility] with the pt.'s approval.  TC to Lupita Leash, with [facility], to give referral for [facility] PT/OT/RN.  Pt. to dc home today. Tera Mater, RN, BSN NCM [phone]

## 2013-04-16 NOTE — Progress Notes (Signed)
ANTICOAGULATION CONSULT NOTE - Follow Up Consult  Pharmacy Consult for warfarin Indication: atrial fibrillation  Allergies  Allergen Reactions  . Ace Inhibitors Cough  . Food Other (See Comments)    Berries,tomato  . Nutritional Supplements Other (See Comments)    Unknown.  Marland Kitchen Penicillins Swelling  . Simvastatin Other (See Comments)    Myalgia   . Amlodipine Besylate Rash  . Aspirin Rash and Other (See Comments)    GI distress    Patient Measurements: Height: 5\' 6"  (167.6 cm) Weight: 131 lb 6.4 oz (59.603 kg) (a scale) IBW/kg (Calculated) : 59.3   Vital Signs: Temp: 97.8 F (36.6 C) (09/25 1031) Temp src: Oral (09/25 1031) BP: 109/58 mmHg (09/25 1031) Pulse Rate: 72 (09/25 1031)  Labs:  Recent Labs  04/14/13 0605 04/15/13 0948 04/16/13 0625  HGB 8.4* 8.8* 8.6*  HCT 25.1* 26.9* 26.3*  PLT 194 201 188  LABPROT 23.3* 22.1*  --   INR 2.15* 2.00*  --   CREATININE 1.58* 1.66* 1.65*    Estimated Creatinine Clearance: 19.9 ml/min (by C-G formula based on Cr of 1.65).   Medications:  Scheduled:  . donepezil  5 mg Oral QHS  . ezetimibe  10 mg Oral Daily  . furosemide  40 mg Oral BID  . hydrALAZINE  25 mg Oral Q12H  . isosorbide dinitrate  10 mg Oral BID  . pantoprazole  40 mg Oral Daily  . sodium chloride  3 mL Intravenous Q12H  . warfarin  3 mg Oral q1800  . Warfarin - Pharmacist Dosing Inpatient   Does not apply q1800    Assessment: 32 YOF admitted with CHF exacerbation continuing on warfarin for AFib. Her home dose is 2.5mg  daily which wasincreased yesterday to 3mg  daily as her INRs have been on lower end of therapeutic range. INR checks were changed to MWF, so no new INR today though noted plans for potential discharge. Of note, she told me during our education session that she does like leafy greens, so feel dose increase is appropriate. She also stated she has her INR checked every Tuesday. Hgb low but stable, platelets WNL. No overt bleeding  noted.  Goal of Therapy:  INR 2-3 Monitor platelets by anticoagulation protocol: Yes   Plan:  1. Recommend continuing warfarin 3mg  daily at discharge 2. MWF INR while she is here 3. INR check as an outpatient on Tuesday 9/30 would be acceptable so she can keep her same schedule as PTA.  Kaaren Nass D. Naturi Alarid, PharmD Clinical Pharmacist Pager: [phone] 04/16/2013 11:45 AM

## 2013-04-16 NOTE — Discharge Summary (Signed)
Physician Discharge Summary  Patient ID: [name] MRN: [id number] DOB/AGE: [birth year] 77 y.o.  Admit date: 04/09/2013 Discharge date: 04/16/2013  Admission Diagnoses:   Acute on chronic diastolic congestive heart failure with hypoxia  Discharge Diagnoses:  Principal Problem:   Acute on chronic diastolic congestive heart failure with hypoxia Active Problems:   Atrial fibrillation, permanent   Chronic diastolic heart failure   TREMOR   Coronary artery disease - S/P remote CABG x 4 (1980's)   Chronic anticoagulation - on Warfarin   Aortic stenosis- moderate to moderate/ severe 04/11/13   Pacemaker - implanted for SSS ([employer])   Chronic renal insufficiency, stage III (moderate)   HOH (hard of hearing)   Anemia   Discharged Condition: stable  Hospital Course:   79 YOAAF with an extensive history of cardiac problems, and has been followed in our office by Dr. Elsie Lincoln, then by Dr. Royann Shivers. Her history includes severe mitral insufficiency, severe aortic stenosis, moderate pulmonary hypertension, and coronary artery disease. She is status post previous bypass surgery, in the 1980's, (graft-dependent with totally occluded native coronary arteries, occluded saphenous vein graft to the right coronary artery, patent sequential vein graft to the LAD diagonal, OM1 and OM2). These findings are from her last cath in 2008, by Dr. Elsie Lincoln. Since then, she has been treated medically for her CAD. She also has CHF, and Permanent atrial fibrillation. She is on chronic anticoagulation therapy with warfarin. She also has a pacemaker [employer]). She apparently had SSS in the past. The device was interrogated during her last office visit, with Dr. Royann Shivers in January of this year, and was functioning normally.  Echo 2010 EF 50-55%, grade 1 diastolc dysfunction, mild to mod AS Mild MR, Lt atrium moderately dilated. Rt atrium mild to mod dilated. Mild to mod TR. PA pk pressure 57 mm HG. Last Nuc study 2008.  Dobutamine stress Echo 01/2011.     She woke up feeling worthless, no energy, no chest pain, maybe a little shortness of breath. She drove to see her PCP and in his office SPO2 was 77% on room air. She was then was transported by EMS to [facility] for further eval. Here Pro BNP is 9265, K+ low, H/H 9.4/28.8; INR 2.22, CXR As below.  EKG with atrial fib with pacing. She has been given 20 mg IV lasix. SPO2 here in the ER in the 90s (97-95) on room air. Currently no complaints, just felt well ( her usual) yesterday and then this AM very tired. She has had some lower ext. Edema. No syncope  She was admitted for IV diuretic therapy.  Net fluids were -1.1L, however, there were several instances where the outs were not charted.  Weight decreased from 136 to 131 pounds.  She had symptomatic improvement.  She was seen by OT who recommended 24 hour supervision.  The patient has several family members who check on her regularly.   2D echo revealed EF of 45-50% with an AV area estimated at ~0.7 cm2.  Lasix was changed to 40mg  Daily.  She will follow up in 7 days. The patient was seen by Dr. Rennis Golden who felt she was stable for DC home.  Consults: None  Significant Diagnostic Studies: Study Conclusions  - Left ventricle: The cavity size was dilated. There was hypertrophy, with an appearance of moderate eccentric hypertrophy. Systolic function was mildly reduced. The estimated ejection fraction was 45%, in the range of 45% to 50%. Doppler parameters are consistent with restrictive physiology, indicative  of decreased left ventricular diastolic compliance and/or increased left atrial pressure. Doppler parameters are consistent with both elevated ventricular end-diastolic filling pressure and elevated left atrial filling pressure. - Ventricular septum: Septal motion showed abnormal function and dyssynergy. The contour showed diastolic flattening and systolic flattening. These changes are consistent with right ventricular  pacing. These changes are also consistent with RV pressure overload. - Aortic valve: Severe thickening, calcification, and nodularity. Cusp separation was moderately reduced. Valve mobility was moderately restricted. Transvalvular velocity was increased, due to stenosis. There was moderate (borderline moderate to severe) stenosis. Visually, the Righ cusp does appear to move. The transvalvular velocities do not correlate well with the estimated vavle area of ~0.7 cm2, Mild regurgitation. Valve area: 0.6cm^2(VTI). Valve area: 0.69cm^2 (Vmax). - Mitral valve: Calcified annulus. Mildly thickened leaflets . Moderate regurgitation directed posteriorly and toward the free wall. - Left atrium: The atrium was massively dilated. - Right ventricle: The cavity size was moderately dilated. Wall thickness was normal. Systolic function was mildly reduced. - Right atrium: The atrium was massively dilated. - Tricuspid valve: Wide-open regurgitation originating from the central coaptation point and directed centrally. - Pulmonary arteries: PA peak pressure: 83mm Hg (S). - Pericardium, extracardiac: There was a left pleural effusion. Impressions:  - An atrial fibrillation rhythm was noted on this study, making this study technically difficult for the assessment of cardiac function. The right ventricular systolic pressure was increased consistent with severe pulmonary hypertension.   Treatments: IV lasix  Discharge Exam: Blood pressure 109/58, pulse 72, temperature 97.8 F (36.6 C), temperature source Oral, resp. rate 18, height 5\' 6"  (1.676 m), weight 131 lb 6.4 oz (59.603 kg), SpO2 95.00%.   Disposition: 01-Home or Self Care  Discharge Orders   Future Appointments Provider Department Dept Phone   04/23/2013 11:15 AM Thurmon Fair, MD [facility] [phone]   Future Orders Complete By Expires   Diet - low sodium heart healthy  As directed    Discharge instructions  As  directed    Comments:     Monitor weight daily and call our office if you gain 2 pound in 24 hours or 5 pounds in seven days.   Increase activity slowly  As directed        Medication List    STOP taking these medications       carvedilol 3.125 MG tablet  Commonly known as:  COREG      TAKE these medications       donepezil 5 MG tablet  Commonly known as:  ARICEPT  Take 5 mg by mouth at bedtime.     ezetimibe 10 MG tablet  Commonly known as:  ZETIA  Take 10 mg by mouth daily.     furosemide 40 MG tablet  Commonly known as:  LASIX  Take 1 tablet (40 mg total) by mouth daily.     hydrALAZINE 25 MG tablet  Commonly known as:  APRESOLINE  Take 1 tablet (25 mg total) by mouth every 12 (twelve) hours.     isosorbide dinitrate 10 MG tablet  Commonly known as:  ISORDIL  Take 1 tablet (10 mg total) by mouth 2 (two) times daily.     nitroGLYCERIN 0.4 MG SL tablet  Commonly known as:  NITROSTAT  Place 1 tablet (0.4 mg total) under the tongue every 5 (five) minutes as needed for chest pain.     pantoprazole 40 MG tablet  Commonly known as:  PROTONIX  Take 40 mg by mouth daily.  warfarin 5 MG tablet  Commonly known as:  COUMADIN  Take 2.5 mg by mouth daily.           Follow-up Information   Follow up with Loann Chahal, PA-C. (Our office will call you with the appt date and time.)    Specialty:  Physician Assistant   Contact information:   [address] [place] [address] [phone]       Signed: Wilburt Finlay 04/16/2013, 3:41 PM

## 2013-04-16 NOTE — Progress Notes (Signed)
Pt. Resting in bed quietly during the night. No s/s of distress or discomfort noted. No c/o pain. Call light within reach of pt. RN will continue to monitor pt. For changes in condition. [name], Cheryll Dessert

## 2013-04-17 ENCOUNTER — Telehealth: Payer: Self-pay | Admitting: Physician Assistant

## 2013-04-17 NOTE — Telephone Encounter (Signed)
I spoke to [name] briefly.  She is very harding of hearing, but appears to be doing well.  She did say she was doing fine.  I also spoke to her granddaughter, Vicente Serene, who reports her grandmother is doing well.  I also confirm her appt. With Dr. Salena Saner on 10/2.  Shantoria Ellwood 12:09 PM

## 2013-04-20 ENCOUNTER — Ambulatory Visit: Payer: Medicare Other | Admitting: Physician Assistant

## 2013-04-23 ENCOUNTER — Ambulatory Visit (INDEPENDENT_AMBULATORY_CARE_PROVIDER_SITE_OTHER): Payer: Medicare Other | Admitting: Cardiovascular Disease

## 2013-04-23 ENCOUNTER — Encounter: Payer: Self-pay | Admitting: Cardiovascular Disease

## 2013-04-23 ENCOUNTER — Other Ambulatory Visit: Payer: Self-pay | Admitting: Cardiovascular Disease

## 2013-04-23 VITALS — BP 112/60 | HR 84 | Ht 66.0 in | Wt 131.0 lb

## 2013-04-23 DIAGNOSIS — Z79899 Other long term (current) drug therapy: Secondary | ICD-10-CM

## 2013-04-23 DIAGNOSIS — Z950 Presence of cardiac pacemaker: Secondary | ICD-10-CM

## 2013-04-23 DIAGNOSIS — R0602 Shortness of breath: Secondary | ICD-10-CM

## 2013-04-23 DIAGNOSIS — I4891 Unspecified atrial fibrillation: Secondary | ICD-10-CM

## 2013-04-23 LAB — PACEMAKER DEVICE OBSERVATION
BMOD-0002RV: 10
BRDY-0004RV: 110 {beats}/min
RV LEAD AMPLITUDE: 7.8 mv
RV LEAD IMPEDENCE PM: 567 Ohm
RV LEAD THRESHOLD: 0.75 V
VENTRICULAR PACING PM: 81
VENTRICULAR PACING PM: 77

## 2013-04-23 MED ORDER — POTASSIUM CHLORIDE CRYS ER 10 MEQ PO TBCR: 10.0000 meq | EXTENDED_RELEASE_TABLET | Freq: Every day | ORAL | Status: DC

## 2013-04-23 NOTE — Progress Notes (Signed)
In office pacemaker check for VP only. No changes made. Full interrogation not

## 2013-04-23 NOTE — Patient Instructions (Addendum)
Your physician has recommended you make the following change in your medication: Furosemide 40 mg twice daily, and take potassium 10 meq once daily.  Your physician recommends that you schedule a follow-up appointment in: 2 weeks  You also need to have your Coumadin level checked in 2 weeks.  Your physician recommends that you return for lab work just prior to your next appointment.

## 2013-04-23 NOTE — Progress Notes (Signed)
Patient ID: [name], female   DOB: [birth year], 79 y.o.   MRN: [id number]     Reason for office visit Congestive heart failure, recent acute exacerbation followup  Mrs. [name] is an elderly woman with mitral and aortic valve disease, coronary disease status post remote bypass surgery, mildly depressed left ventricle systolic function and a long-standing history of congestive heart failure requiring numerous episodes of escalation of diuretic therapy over the last couple of years. She was recently hospitalized for intravenous diuretics and returns in followup. At discharge from hospital her weight was recorded at 131 pounds on the hospital scale and she weighs exactly the same in our office scale today. However her home scale shows 121 pounds and according to her niece has been relatively steady. Unfortunately they did not bring a written record of her weights.  Mrs. [name] still feels very short of breath, NYHA functional class 2-3. She still has ankle edema and overt elevation in jugular venous pulsations. She does not have rales. She denies orthopnea or paroxysmal atrial dyspnea. She has never complained of angina. She denies dizziness or syncope. She feels very tired.  She has permanent atrial fibrillation with slow ventricular response and a permanent pacemaker programmed VVIR. We decreased the background pacing rates and introduced negative histories is in an attempt to reduce the frequency ventricular pacing. It is probably a little early to tell if this was effective but compared with last week the proportion of ventricular pacing is decreased from 81% to 77%.    Allergies  Allergen Reactions  . Ace Inhibitors Cough  . Food Other (See Comments)    Berries,tomato  . Nutritional Supplements Other (See Comments)    Unknown.  Marland Kitchen Penicillins Swelling  . Simvastatin Other (See Comments)    Myalgia   . Amlodipine Besylate Rash  . Aspirin Rash and Other (See Comments)    GI distress      Current Outpatient Prescriptions  Medication Sig Dispense Refill  . donepezil (ARICEPT) 5 MG tablet Take 5 mg by mouth at bedtime.      Marland Kitchen ezetimibe (ZETIA) 10 MG tablet Take 10 mg by mouth daily.        . furosemide (LASIX) 40 MG tablet Take 1 tablet (40 mg total) by mouth daily.  30 tablet  5  . hydrALAZINE (APRESOLINE) 25 MG tablet Take 1 tablet (25 mg total) by mouth every 12 (twelve) hours.  60 tablet  5  . isosorbide dinitrate (ISORDIL) 10 MG tablet Take 1 tablet (10 mg total) by mouth 2 (two) times daily.  60 tablet  5  . nitroGLYCERIN (NITROSTAT) 0.4 MG SL tablet Place 1 tablet (0.4 mg total) under the tongue every 5 (five) minutes as needed for chest pain.  20 tablet  0  . pantoprazole (PROTONIX) 40 MG tablet Take 40 mg by mouth daily.       Marland Kitchen warfarin (COUMADIN) 5 MG tablet Take 2.5 mg by mouth daily.      . potassium chloride (KLOR-CON M10) 10 MEQ tablet Take 1 tablet (10 mEq total) by mouth daily.  30 tablet  6   No current facility-administered medications for this visit.    Past Medical History  Diagnosis Date  . CHF (congestive heart failure)     EF 30-35% on 2D Echo in July 2012  . High cholesterol   . Atrial fibrillation     rate controlled, on chronic coumadin therapy  . Sick sinus syndrome     s/p pacemaker  .  Chronic kidney disease (CKD), stage III (moderate)   . Anemia     baseline 7.8-9.0  . Coronary artery disease     stent in 2008 shows CABG to LAD, RCA and circumflex with 100% occlusion  . Heart murmur   . Mitral regurgitation     severe MR,EF 30-35%  . Ischemic cardiomyopathy   . Aortic stenosis     severe  . Renal insufficiency, mild     Past Surgical History  Procedure Laterality Date  . Exploratory laparotomy w/ bowel resection  2/12    in setting of SBO  . Pacemaker insertion  03/30/2009    [employer]  Zephyr  . R breast lumpectomy  05/2004  . Ureterolithotomy  01/2002  . Insert / replace / remove pacemaker    . Coronary artery bypass graft   1980's    No family history of illnesses pertinent to her current medical problems  History   Social History  . Marital Status: Single    Spouse Name: N/A    Number of Children: N/A  . Years of Education: N/A   Occupational History  . Not on file.   Social History Main Topics  . Smoking status: Never Smoker   . Smokeless tobacco: Never Used  . Alcohol Use: No  . Drug Use: No  . Sexual Activity: No   Other Topics Concern  . 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  The patient also denies abdominal pain, nausea, vomiting, dysphagia, diarrhea, constipation, polyuria, polydipsia, dysuria, hematuria, frequency, urgency, abnormal bleeding or bruising, fever, chills, unexpected weight changes, mood swings, change in skin or hair texture, change in voice quality, visual problems, allergic reactions or rashes, new musculoskeletal complaints other than usual "aches and pains".  She is very hard of hearing and this is a long-standing problem the   PHYSICAL EXAM BP 112/60  Pulse 84  Ht 5\' 6"  (1.676 m)  Wt 131 lb (59.421 kg)  BMI 21.15 kg/m2  General: Alert, oriented x3, no distress Head: no evidence of trauma, PERRL, EOMI, no exophtalmos or lid lag, no myxedema, no xanthelasma; normal ears, nose and oropharynx Neck: normal jugular venous pulsations are elevated to the earlobes bilaterally without any additional possible hepatojugular reflux; brisk carotid pulses without delay, with bilateral carotid bruits probably radiate from the chest Chest: clear to auscultation, no signs of consolidation by percussion or palpation,  normal fremitus, symmetrical and full respiratory excursions Cardiovascular: Laterally displaced apical impulse, regular rhythm, normal first and second heart sounds, no rubs or gallops, grade 3/6 apical holosystolic murmur, grade 2-3/6 early to mid peaking aortic ejection murmur Abdomen: no tenderness or distention, no masses by palpation, no abnormal pulsatility or arterial bruits, normal bowel sounds, no hepatosplenomegaly Extremities: no clubbing, cyanosis; 2+ bilateral symmetrical pitting edema of the ankles; 2+ radial, ulnar and brachial pulses bilaterally; 2+ right femoral, posterior tibial and dorsalis pedis pulses; 2+ left femoral, posterior tibial and dorsalis pedis pulses; no subclavian or femoral bruits Neurological: grossly nonfocal, very hard of hearing   EKG: Atrial fibrillation, intermittent ventricular pacing  Lipid Panel     Component Value Date/Time   CHOL  Value: 115  ATP III CLASSIFICATION:  <200     mg/dL   Desirable  161-096  mg/dL   Borderline High  >=045    mg/dL   High        4/0/9811 0540   TRIG 56 03/29/2009 0540   HDL 54 03/29/2009 0540   CHOLHDL 2.1 03/29/2009 0540   VLDL 11 03/29/2009 0540   LDLCALC  Value: 50        Total Cholesterol/HDL:CHD Risk Coronary Heart Disease Risk Table                     Men   Women  1/2 Average Risk   3.4   3.3  Average Risk       5.0   4.4  2 X Average Risk   9.6   7.1  3 X Average Risk  23.4   11.0        Use the calculated Patient Ratio above and the CHD Risk Table to determine the patient's CHD Risk.        ATP III CLASSIFICATION (LDL):  <100     mg/dL   Optimal  914-782  mg/dL   Near or Above                    Optimal  130-159  mg/dL   Borderline  956-213  mg/dL   High  >086     mg/dL   Very High 11/27/8467 6295    BMET    Component Value Date/Time   NA 144 04/16/2013 0625   K 3.9 04/16/2013 0625   CL 106 04/16/2013 0625   CO2 27 04/16/2013 0625   GLUCOSE 95 04/16/2013 0625   BUN 41* 04/16/2013 0625   CREATININE 1.65* 04/16/2013  0625   CREATININE 1.61* 03/24/2013 1524   CALCIUM 9.3 04/16/2013 0625   GFRNONAA 26* 04/16/2013 0625   GFRAA 30* 04/16/2013 0625     ASSESSMENT AND PLAN  Mrs. Desrosier is still clearly hypervolemic with evidence of both right and left heart failure. I have recommended that she double the dose of her furosemide and start taking potassium chloride 10 mEq daily. She has stage III chronic kidney disease and we will need to monitor her creatinine and potassium levels carefully. Unfortunately medical therapy will have limited benefit in view of the severity of her valvular problems. I do not think she is a candidate for either surgical or percutaneous treatment of her aortic stenosis or mitral insufficiency because of her age and other medical problems. In fact she is remarkably functional for the extent of her cardiac illness, but the situation is quite complicated. I think the focus should be on maintaining good quality of life, palliation of dyspnea and avoidance of permanent kidney failure.  A full pacemaker check was performed today but we did not perform any additional programming changes.  She is instructed to bring back a detailed list of her weights checked every morning at the same time.  When she returns to see me in 2 weeks she will also have a prothrombin time checked in our Coumadin clinic.  Orders Placed This Encounter  Procedures  . Basic Metabolic Panel (BMET)  . B Nat Peptide  . Pacemaker Device Observation   Meds ordered this encounter  Medications  . potassium chloride (KLOR-CON M10) 10 MEQ tablet    Sig: Take 1 tablet (10 mEq total) by mouth daily.    Dispense:  30 tablet    Refill:  6    Dvontae Ruan  Thurmon Fair, MD, [facility] [phone] office [phone] pager

## 2013-04-29 ENCOUNTER — Encounter (HOSPITAL_COMMUNITY): Payer: Self-pay | Admitting: Emergency Medicine

## 2013-04-29 ENCOUNTER — Emergency Department (HOSPITAL_COMMUNITY)
Admission: EM | Admit: 2013-04-29 | Discharge: 2013-04-29 | Disposition: A | Discharge status: Home / Self Care | Payer: Medicare Other | Attending: Emergency Medicine | Admitting: Emergency Medicine

## 2013-04-29 DIAGNOSIS — I509 Heart failure, unspecified: Secondary | ICD-10-CM | POA: Insufficient documentation

## 2013-04-29 DIAGNOSIS — N189 Chronic kidney disease, unspecified: Secondary | ICD-10-CM

## 2013-04-29 DIAGNOSIS — N183 Chronic kidney disease, stage 3 unspecified: Secondary | ICD-10-CM | POA: Insufficient documentation

## 2013-04-29 DIAGNOSIS — Z951 Presence of aortocoronary bypass graft: Secondary | ICD-10-CM | POA: Insufficient documentation

## 2013-04-29 DIAGNOSIS — I4891 Unspecified atrial fibrillation: Secondary | ICD-10-CM | POA: Insufficient documentation

## 2013-04-29 DIAGNOSIS — Z880 Allergy status to penicillin: Secondary | ICD-10-CM | POA: Insufficient documentation

## 2013-04-29 DIAGNOSIS — I2510 Atherosclerotic heart disease of native coronary artery without angina pectoris: Secondary | ICD-10-CM | POA: Insufficient documentation

## 2013-04-29 DIAGNOSIS — Z862 Personal history of diseases of the blood and blood-forming organs and certain disorders involving the immune mechanism: Secondary | ICD-10-CM | POA: Insufficient documentation

## 2013-04-29 DIAGNOSIS — Z8639 Personal history of other endocrine, nutritional and metabolic disease: Secondary | ICD-10-CM | POA: Insufficient documentation

## 2013-04-29 DIAGNOSIS — R011 Cardiac murmur, unspecified: Secondary | ICD-10-CM | POA: Insufficient documentation

## 2013-04-29 DIAGNOSIS — Z79899 Other long term (current) drug therapy: Secondary | ICD-10-CM | POA: Insufficient documentation

## 2013-04-29 DIAGNOSIS — Z7901 Long term (current) use of anticoagulants: Secondary | ICD-10-CM | POA: Insufficient documentation

## 2013-04-29 LAB — BASIC METABOLIC PANEL
CO2: 23 mEq/L (ref 19–32)
Chloride: 105 mEq/L (ref 96–112)
Creatinine, Ser: 1.91 mg/dL — ABNORMAL HIGH (ref 0.50–1.10)
GFR calc Af Amer: 25 mL/min — ABNORMAL LOW (ref >90)
Glucose, Bld: 104 mg/dL — ABNORMAL HIGH (ref 70–99)
Sodium: 143 mEq/L (ref 135–145)

## 2013-04-29 LAB — CBC
HCT: 26 % — ABNORMAL LOW (ref 36.0–46.0)
Hemoglobin: 8.8 g/dL — ABNORMAL LOW (ref 12.0–15.0)
MCV: 94.5 fL (ref 78.0–100.0)
Platelets: 236 10*3/uL (ref 150–400)
RBC: 2.75 MIL/uL — ABNORMAL LOW (ref 3.87–5.11)
WBC: 4.6 10*3/uL (ref 4.0–10.5)

## 2013-04-29 LAB — PROTIME-INR: Prothrombin Time: 26.7 seconds — ABNORMAL HIGH (ref 11.6–15.2)

## 2013-04-29 MED ORDER — FUROSEMIDE 40 MG PO TABS: 40.0000 mg | ORAL_TABLET | Freq: Every day | ORAL | Status: DC

## 2013-04-29 NOTE — ED Notes (Signed)
Two unsuccessful attempts made to start IV

## 2013-04-29 NOTE — ED Provider Notes (Signed)
CSN: [id number]     Arrival date & time 04/29/13  1849 History   First MD Initiated Contact with Patient 04/29/13 1855     Chief Complaint  Patient presents with  . Leg Swelling   (Consider location/radiation/quality/duration/timing/severity/associated sxs/prior Treatment) HPI Pt with history of CHF, CKD and valve disease who is chronically fluid overloaded. She was seen in Cards office about a week ago for same, had Lasix increased from 40mg  daily to 40mg  BID, saw PCP yesterday for checkup and doing well. Today her home health aid came to check on her and noticed she was sleeping a lot. She states she didn't sleep well last night. Aid was concerned about leg swelling and JVD but this is chronic for her. Pt denies any SOB or CP. States she is in her normal state of health.   Past Medical History  Diagnosis Date  . CHF (congestive heart failure)     EF 30-35% on 2D Echo in July 2012  . High cholesterol   . Atrial fibrillation     rate controlled, on chronic coumadin therapy  . Sick sinus syndrome     s/p pacemaker  . Chronic kidney disease (CKD), stage III (moderate)   . Anemia     baseline 7.8-9.0  . Coronary artery disease     stent in 2008 shows CABG to LAD, RCA and circumflex with 100% occlusion  . Heart murmur   . Mitral regurgitation     severe MR,EF 30-35%  . Ischemic cardiomyopathy   . Aortic stenosis     severe  . Renal insufficiency, mild    Past Surgical History  Procedure Laterality Date  . Exploratory laparotomy w/ bowel resection  2/12    in setting of SBO  . Pacemaker insertion  03/30/2009    [employer]  Zephyr  . R breast lumpectomy  05/2004  . Ureterolithotomy  01/2002  . Insert / replace / remove pacemaker    . Coronary artery bypass graft  1980's   History reviewed. No pertinent family history. History  Substance Use Topics  . Smoking status: Never Smoker   . Smokeless tobacco: Never Used  . Alcohol Use: No   OB History   Grav Para Term Preterm  Abortions TAB SAB Ect Mult Living                 Review of Systems All other systems reviewed and are negative except as noted in HPI.   Allergies  Penicillins; Simvastatin; Ace inhibitors; Amlodipine besylate; Aspirin; Food; and Nutritional supplements  Home Medications   Current Outpatient Rx  Name  Route  Sig  Dispense  Refill  . donepezil (ARICEPT) 5 MG tablet   Oral   Take 5 mg by mouth at bedtime.         Marland Kitchen ezetimibe (ZETIA) 10 MG tablet   Oral   Take 10 mg by mouth daily.           . furosemide (LASIX) 40 MG tablet   Oral   Take 40 mg by mouth 2 (two) times daily.         . hydrALAZINE (APRESOLINE) 25 MG tablet   Oral   Take 1 tablet (25 mg total) by mouth every 12 (twelve) hours.   60 tablet   5   . isosorbide dinitrate (ISORDIL) 10 MG tablet   Oral   Take 1 tablet (10 mg total) by mouth 2 (two) times daily.   60 tablet   5   .  pantoprazole (PROTONIX) 40 MG tablet   Oral   Take 40 mg by mouth daily.          . potassium chloride (KLOR-CON M10) 10 MEQ tablet   Oral   Take 1 tablet (10 mEq total) by mouth daily.   30 tablet   6   . warfarin (COUMADIN) 5 MG tablet   Oral   Take 2.5 mg by mouth daily.         . nitroGLYCERIN (NITROSTAT) 0.4 MG SL tablet   Sublingual   Place 1 tablet (0.4 mg total) under the tongue every 5 (five) minutes as needed for chest pain.   20 tablet   0    BP 117/52  Pulse 80  Temp(Src) 97.9 F (36.6 C) (Oral)  Resp 12  Wt 130 lb 1.6 oz (59.013 kg)  BMI 21.01 kg/m2  SpO2 96% Physical Exam  Nursing note and vitals reviewed. Constitutional: She is oriented to person, place, and time. She appears well-developed and well-nourished.  HENT:  Head: Normocephalic and atraumatic.  Eyes: EOM are normal. Pupils are equal, round, and reactive to light.  Neck: Normal range of motion. Neck supple. JVD present.  Cardiovascular: Normal rate, normal heart sounds and intact distal pulses.   Pulmonary/Chest: Effort normal  and breath sounds normal.  Abdominal: Bowel sounds are normal. She exhibits no distension. There is no tenderness.  Musculoskeletal: Normal range of motion. She exhibits edema (mild bilateral equal). She exhibits no tenderness.  Neurological: She is alert and oriented to person, place, and time. She has normal strength. No cranial nerve deficit or sensory deficit.  Skin: Skin is warm and dry. No rash noted.  Psychiatric: She has a normal mood and affect.    ED Course  Procedures (including critical care time) Labs Review Labs Reviewed  CBC - Abnormal; Notable for the following:    RBC 2.75 (*)    Hemoglobin 8.8 (*)    HCT 26.0 (*)    RDW 17.7 (*)    All other components within normal limits  BASIC METABOLIC PANEL - Abnormal; Notable for the following:    Glucose, Bld 104 (*)    BUN 49 (*)    Creatinine, Ser 1.91 (*)    GFR calc non Af Amer 22 (*)    GFR calc Af Amer 25 (*)    All other components within normal limits  PROTIME-INR - Abnormal; Notable for the following:    Prothrombin Time 26.7 (*)    INR 2.57 (*)    All other components within normal limits  PRO B NATRIURETIC PEPTIDE - Abnormal; Notable for the following:    Pro B Natriuretic peptide (BNP) 9884.0 (*)    All other components within normal limits   Imaging Review No results found.  MDM   1. Chronic CHF   2. Chronic kidney disease      Date: 04/29/2013  Rate: atrial fibrillation  Rhythm: atrial fibrillation  QRS Axis: normal  Intervals: normal  ST/T Wave abnormalities: nonspecific T wave changes  Conduction Disutrbances:nonspecific intraventricular conduction delay  Narrative Interpretation:   Old EKG Reviewed: similar morphology but pacer spikes not apparent on today's tracing  10:17 PM Pt remains asymptomatic. She has increase in Cr above her baseline due to recent increase in Lasix. Discussed with Dr. Chase Picket on call for Cardiology who recommends decreasing Lasix back to 40mg  daily and encourage the  patient to followup with Dr. Royann Shivers in 1-2 days. Pt and family at bedside are amenable  to this plan. Advised to continue with previously prescribed diet and daily weights. Return to the ED for any other concerns.     Charles B. Bernette Mayers, MD 04/29/13 2219

## 2013-04-29 NOTE — ED Notes (Signed)
Pt from home,sent here by home health nurse for eval of leg edema and jvd. Pt in no signs of distress, per ems, Niece states she is at baseline.

## 2013-04-29 NOTE — ED Notes (Signed)
Pt and family states understanding of discharge instructions 

## 2013-05-01 ENCOUNTER — Ambulatory Visit (INDEPENDENT_AMBULATORY_CARE_PROVIDER_SITE_OTHER): Payer: Medicare Other | Admitting: Cardiology

## 2013-05-01 ENCOUNTER — Encounter: Payer: Self-pay | Admitting: Cardiology

## 2013-05-01 VITALS — BP 100/60 | HR 78 | Ht 67.0 in | Wt 133.0 lb

## 2013-05-01 DIAGNOSIS — I509 Heart failure, unspecified: Secondary | ICD-10-CM

## 2013-05-01 DIAGNOSIS — I5033 Acute on chronic diastolic (congestive) heart failure: Secondary | ICD-10-CM

## 2013-05-01 DIAGNOSIS — Z950 Presence of cardiac pacemaker: Secondary | ICD-10-CM

## 2013-05-01 DIAGNOSIS — I2510 Atherosclerotic heart disease of native coronary artery without angina pectoris: Secondary | ICD-10-CM

## 2013-05-01 DIAGNOSIS — I4891 Unspecified atrial fibrillation: Secondary | ICD-10-CM

## 2013-05-01 DIAGNOSIS — N183 Chronic kidney disease, stage 3 unspecified: Secondary | ICD-10-CM

## 2013-05-01 DIAGNOSIS — D649 Anemia, unspecified: Secondary | ICD-10-CM

## 2013-05-01 DIAGNOSIS — I4821 Permanent atrial fibrillation: Secondary | ICD-10-CM

## 2013-05-01 MED ORDER — FUROSEMIDE 40 MG PO TABS: ORAL_TABLET | ORAL | Status: DC

## 2013-05-01 NOTE — Assessment & Plan Note (Addendum)
Hgb 8.8 on 10/8

## 2013-05-01 NOTE — Patient Instructions (Signed)
Change Lasix to 40 mg daily with 80 mg M/W/F

## 2013-05-01 NOTE — Assessment & Plan Note (Signed)
SCr 1.91 on 10/8

## 2013-05-01 NOTE — Progress Notes (Signed)
05/01/2013 [name]   May 20, 1918  [id number]  Primary Physicia No PCP Per Patient Primary Cardiologist: Dr Royann Shivers  HPI:  79 y/o with mitral and aortic valve disease, coronary disease status post remote bypass surgery, and mildly depressed left ventricle systolic function with an EF of 45-50% by echo Sept 2014. She has moderate to severe AS.  She has a long-standing history of congestive heart failure requiring numerous episodes of escalation of diuretic therapy over the last couple of years. She was recently hospitalized with acute on chronic CHF and discharged 04/09/13 on Lasix 40 mg daily. She saw Dr Royann Shivers 04/23/13 and her Lasix was increased to 80 mg daily because of some LE edema. She was visited by a [facility] 04/29/13 who called to say the pt was "sleepy" and breathing 28 times a minute. We initially wanted to increase her diuretics but the RN at the scene felt she should go to the ER. Her daughter says she actually walked from the house to the ambulance at the street to get on the stretcher. In the ER she was not felt to be in CHF and in fact was felt to be a little dry. Her BUN/SCr were a little above her baseline at 49/1.9. Her BNP was 9884, this seem to be chronic for her. Her Lasix was cut back to 40 mg daily after speaking with the cardiology fellow and the pt was sent home. She is here now for follow up. Since her ER visit she has been at her baseline. She does complain of lower extremity edema but this is not severe on exam. I explained we may have to accept some edema and focus more on dyspnea and wgt gain to guide diuretic therapy. She has an appointment next week with Dr Royann Shivers. I did increase her Lasix to 40 mg daily with 80 mg on M/W/F. She'll need a BNP in follow up at her next visit.    Current Outpatient Prescriptions  Medication Sig Dispense Refill  . donepezil (ARICEPT) 5 MG tablet Take 5 mg by mouth at bedtime.      Marland Kitchen ezetimibe (ZETIA) 10 MG tablet Take 10 mg by mouth  daily.        . furosemide (LASIX) 40 MG tablet Take 40 mg daily except 80 mg on M/W/F  30 tablet  0  . hydrALAZINE (APRESOLINE) 25 MG tablet Take 1 tablet (25 mg total) by mouth every 12 (twelve) hours.  60 tablet  5  . isosorbide dinitrate (ISORDIL) 10 MG tablet Take 1 tablet (10 mg total) by mouth 2 (two) times daily.  60 tablet  5  . nitroGLYCERIN (NITROSTAT) 0.4 MG SL tablet Place 1 tablet (0.4 mg total) under the tongue every 5 (five) minutes as needed for chest pain.  20 tablet  0  . pantoprazole (PROTONIX) 40 MG tablet Take 40 mg by mouth daily.       . potassium chloride (KLOR-CON M10) 10 MEQ tablet Take 1 tablet (10 mEq total) by mouth daily.  30 tablet  6  . warfarin (COUMADIN) 5 MG tablet Take 2.5 mg by mouth daily.       No current facility-administered medications for this visit.    Allergies  Allergen Reactions  . Penicillins Swelling  . Simvastatin Other (See Comments)    Myalgia   . Ace Inhibitors Cough  . Amlodipine Besylate Rash  . Aspirin Nausea Only, Rash and Other (See Comments)    GI distress  . Food Other (See Comments)  Berries,tomato  . Nutritional Supplements Other (See Comments)    Unknown.    History   Social History  . Marital Status: Single    Spouse Name: N/A    Number of Children: N/A  . Years of Education: N/A   Occupational History  . Not on file.   Social History Main Topics  . Smoking status: Never Smoker   . Smokeless tobacco: Never Used  . Alcohol Use: No  . Drug Use: No  . Sexual Activity: No   Other Topics Concern  . 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  Cardiovascular: negative for chest pain, dyspnea on exertion, edema, orthopnea, palpitations, paroxysmal nocturnal dyspnea or shortness of breath Dermatological: negative for rash Respiratory:  negative for cough or wheezing Urologic: negative for hematuria Abdominal: negative for nausea, vomiting, diarrhea, bright red blood per rectum, melena, or hematemesis Neurologic: negative for visual changes, syncope, or dizziness All other systems reviewed and are otherwise negative except as noted above.    Blood pressure 100/60, pulse 78, height 5\' 7"  (1.702 m), weight 133 lb (60.328 kg).  General appearance: alert, cooperative and no distress Neck: JVD noted sitting up Lungs: crackles Lt base Heart: irregularly irregular rhythm and 2/6 systolic murmur LSB and Aov  Extremities: 1-2+ edema on Lt (venctomu site), trace on Rt    ASSESSMENT AND PLAN:   Acute on chronic diastolic congestive heart failure with hypoxia Sent to the ER by [facility] 04/29/13- diuretics actually decreased to 40 mg daily secondary to elevated BUN/SCr  Anemia Hgb 8.8 on 10/8  Chronic renal insufficiency, stage III (moderate) SCr 1.91 on 10/8  Pacemaker - implanted for SSS ([employer]) .  Atrial fibrillation, permanent .  Coronary artery disease - S/P remote CABG x 4 (1980's) .   PLAN  Lasix adjusted as noted above. She will keep her follow up with Dr Royann Shivers next week.  Sharryn Belding KPA-C 05/01/2013 11:36 AM

## 2013-05-01 NOTE — Assessment & Plan Note (Signed)
Sent to the ER by [facility] 04/29/13- diuretics actually decreased to 40 mg daily secondary to elevated BUN/SCr

## 2013-05-04 ENCOUNTER — Telehealth: Payer: Self-pay | Admitting: *Deleted

## 2013-05-04 NOTE — Telephone Encounter (Signed)
Signed home health orders faxed to [facility].

## 2013-05-05 ENCOUNTER — Telehealth: Payer: Self-pay | Admitting: *Deleted

## 2013-05-05 NOTE — Telephone Encounter (Signed)
Signed order for OT to [facility].

## 2013-05-07 ENCOUNTER — Encounter: Payer: Self-pay | Admitting: Cardiovascular Disease

## 2013-05-07 ENCOUNTER — Ambulatory Visit (INDEPENDENT_AMBULATORY_CARE_PROVIDER_SITE_OTHER): Payer: Medicare Other | Admitting: Pharmacist Clinician (PhC)/ Clinical Pharmacy Specialist

## 2013-05-07 ENCOUNTER — Ambulatory Visit (INDEPENDENT_AMBULATORY_CARE_PROVIDER_SITE_OTHER): Payer: Medicare Other | Admitting: Cardiovascular Disease

## 2013-05-07 VITALS — BP 122/68 | HR 64 | Ht 67.0 in | Wt 133.8 lb

## 2013-05-07 DIAGNOSIS — Z7901 Long term (current) use of anticoagulants: Secondary | ICD-10-CM

## 2013-05-07 DIAGNOSIS — I4891 Unspecified atrial fibrillation: Secondary | ICD-10-CM

## 2013-05-07 DIAGNOSIS — I4821 Permanent atrial fibrillation: Secondary | ICD-10-CM

## 2013-05-07 DIAGNOSIS — I5033 Acute on chronic diastolic (congestive) heart failure: Secondary | ICD-10-CM

## 2013-05-07 DIAGNOSIS — Z79899 Other long term (current) drug therapy: Secondary | ICD-10-CM

## 2013-05-07 DIAGNOSIS — I2510 Atherosclerotic heart disease of native coronary artery without angina pectoris: Secondary | ICD-10-CM

## 2013-05-07 DIAGNOSIS — Z950 Presence of cardiac pacemaker: Secondary | ICD-10-CM

## 2013-05-07 DIAGNOSIS — I359 Nonrheumatic aortic valve disorder, unspecified: Secondary | ICD-10-CM

## 2013-05-07 DIAGNOSIS — I059 Rheumatic mitral valve disease, unspecified: Secondary | ICD-10-CM

## 2013-05-07 DIAGNOSIS — I340 Nonrheumatic mitral (valve) insufficiency: Secondary | ICD-10-CM

## 2013-05-07 DIAGNOSIS — I350 Nonrheumatic aortic (valve) stenosis: Secondary | ICD-10-CM

## 2013-05-07 DIAGNOSIS — I509 Heart failure, unspecified: Secondary | ICD-10-CM

## 2013-05-07 LAB — PACEMAKER DEVICE OBSERVATION

## 2013-05-07 LAB — POCT INR: INR: 2.8

## 2013-05-07 MED ORDER — LORAZEPAM 0.5 MG PO TABS: 0.5000 mg | ORAL_TABLET | Freq: Three times a day (TID) | ORAL | Status: DC

## 2013-05-07 MED ORDER — FUROSEMIDE 80 MG PO TABS: ORAL_TABLET | ORAL | Status: DC

## 2013-05-07 NOTE — Assessment & Plan Note (Signed)
On warfarin anticoagulation

## 2013-05-07 NOTE — Patient Instructions (Signed)
Increase Furosemide to 80mg  every day.  Dr. Royann Shivers recommends that you return for lab work in: Tuesday 05/12/13.  Dr. Royann Shivers recommends that you schedule a follow-up appointment in: 2 weeks.

## 2013-05-07 NOTE — Progress Notes (Signed)
Patient ID: [name], female   DOB: [birth year], 79 y.o.   MRN: [id number]       Reason for office visit Valvular heart disease, acute exacerbation of heart failure, atrial fibrillation, pacemaker  [name] returns for followup for congestive heart failure and is still grossly hypervolemic. She has worsening lower extremity edema. As always the left leg is a little more swollen than the right. She is also clearly tachypneic, although she denies dyspnea as a major problem. She weighs exactly the same as she did at her last office appointment and has not made any progress with diuresis. Her creatinine increased to 1.9 and her diuretic dose was decreased. She now weighs 133 pounds, roughly 18 pounds more than what we have previously estimated to be her "dry weight".  By previous evaluation confirmed on the most recent hospitalization she has severe valvular heart disease with aortic stenosis and mitral insufficiency. A dobutamine stress test performed a couple of years ago showed that when her heart rate is faster and she stops pacing the ventricle there is significant reduction in the severity of mitral insufficiency, as well as improved left ventricular systolic performance. When she was last seen in the hospital ventricular pacing was occurring greater than 70% of the time and we have made some changes in her pacemaker settings, reducing the lower rate limit and introducing negative hysteresis. Also reduced the dose of beta blocker. There has been a marked reduction in the frequency ventricular pacing, now down to 13% . Unfortunate, so far this has not been associated with obvious clinical gains.   Allergies  Allergen Reactions  . Penicillins Swelling  . Simvastatin Other (See Comments)    Myalgia   . Ace Inhibitors Cough  . Amlodipine Besylate Rash  . Aspirin Nausea Only, Rash and Other (See Comments)    GI distress  . Food Other (See Comments)    Berries,tomato  . Nutritional  Supplements Other (See Comments)    Unknown.    Current Outpatient Prescriptions  Medication Sig Dispense Refill  . donepezil (ARICEPT) 5 MG tablet Take 5 mg by mouth at bedtime.      Marland Kitchen ezetimibe (ZETIA) 10 MG tablet Take 10 mg by mouth daily.        . furosemide (LASIX) 80 MG tablet Take one tablet daily.  30 tablet  5  . hydrALAZINE (APRESOLINE) 25 MG tablet Take 1 tablet (25 mg total) by mouth every 12 (twelve) hours.  60 tablet  5  . isosorbide dinitrate (ISORDIL) 10 MG tablet Take 1 tablet (10 mg total) by mouth 2 (two) times daily.  60 tablet  5  . nitroGLYCERIN (NITROSTAT) 0.4 MG SL tablet Place 1 tablet (0.4 mg total) under the tongue every 5 (five) minutes as needed for chest pain.  20 tablet  0  . pantoprazole (PROTONIX) 40 MG tablet Take 40 mg by mouth daily.       . potassium chloride (KLOR-CON M10) 10 MEQ tablet Take 1 tablet (10 mEq total) by mouth daily.  30 tablet  6  . warfarin (COUMADIN) 5 MG tablet Take 2.5 mg by mouth daily.      Marland Kitchen LORazepam (ATIVAN) 0.5 MG tablet Take 1 tablet (0.5 mg total) by mouth every 8 (eight) hours.  30 tablet  0   No current facility-administered medications for this visit.    Past Medical History  Diagnosis Date  . CHF (congestive heart failure)     EF 30-35% on 2D Echo in  July 2012  . High cholesterol   . Atrial fibrillation     rate controlled, on chronic coumadin therapy  . Sick sinus syndrome     s/p pacemaker  . Chronic kidney disease (CKD), stage III (moderate)   . Anemia     baseline 7.8-9.0  . Coronary artery disease     stent in 2008 shows CABG to LAD, RCA and circumflex with 100% occlusion  . Heart murmur   . Mitral regurgitation     severe MR,EF 30-35%  . Ischemic cardiomyopathy   . Aortic stenosis     severe  . Renal insufficiency, mild     Past Surgical History  Procedure Laterality Date  . Exploratory laparotomy w/ bowel resection  2/12    in setting of SBO  . Pacemaker insertion  03/30/2009    [employer]ude  Zephyr    . R breast lumpectomy  05/2004  . Ureterolithotomy  01/2002  . Insert / replace / remove pacemaker    . Coronary artery bypass graft  1980's    No family history on file.  History   Social History  . Marital Status: Single    Spouse Name: N/A    Number of Children: N/A  . Years of Education: N/A   Occupational History  . Not on file.   Social History Main Topics  . Smoking status: Never Smoker   . Smokeless tobacco: Never Used  . Alcohol Use: No  . Drug Use: No  . Sexual Activity: No   Other Topics Concern  . Not on file   Social History Narrative   Patient drives and volunteers at [facility] hospital and daycare center at baseline    Review of systems: General: negative for chills, fever, night sweats or weight changes.  Cardiovascular: negative for chest pain, dyspnea on exertion, edema, orthopnea, palpitations, paroxysmal nocturnal dyspnea or shortness of breath  Dermatological: negative for rash  Respiratory: negative for cough or wheezing  Urologic: negative for hematuria  Abdominal: negative for nausea, vomiting, diarrhea, bright red blood per rectum, melena, or hematemesis  Neurologic: negative for visual changes, syncope, or dizziness  All other systems reviewed and are otherwise negative except as noted above.   PHYSICAL EXAM BP 122/68  Pulse 64  Ht 5\' 7"  (1.702 m)  Wt 133 lb 12.8 oz (60.691 kg)  BMI 20.95 kg/m2 General: Alert, oriented x3, no distress  Head: no evidence of trauma, PERRL, EOMI, no exophtalmos or lid lag, no myxedema, no xanthelasma; normal ears, nose and oropharynx  Neck: normal jugular venous pulsations are elevated to the earlobes bilaterally without any additional possible hepatojugular reflux; brisk carotid pulses without delay, with bilateral carotid bruits probably radiate from the chest  Chest: clear to auscultation, no signs of consolidation by percussion or palpation, normal fremitus, symmetrical and full respiratory excursions   Cardiovascular: Laterally displaced apical impulse, regular rhythm, normal first and second heart sounds, no rubs or gallops, grade 3/6 apical holosystolic murmur, grade 2-3/6 early to mid peaking aortic ejection murmur  Abdomen: no tenderness or distention, no masses by palpation, no abnormal pulsatility or arterial bruits, normal bowel sounds, no hepatosplenomegaly  Extremities: no clubbing, cyanosis; 2-3+ bilateral (R>L) pitting edema of the calves, to the level of her knees; 2+ radial, ulnar and brachial pulses bilaterally; 2+ right femoral, posterior tibial and dorsalis pedis pulses; 2+ left femoral, posterior tibial and dorsalis pedis pulses; no subclavian or femoral bruits  Neurological: grossly nonfocal, very hard of hearing   Lipid Panel  Component Value Date/Time   CHOL  Value: 115        ATP III CLASSIFICATION:  <200     mg/dL   Desirable  161-096  mg/dL   Borderline High  >=045    mg/dL   High        4/0/9811 0540   TRIG 56 03/29/2009 0540   HDL 54 03/29/2009 0540   CHOLHDL 2.1 03/29/2009 0540   VLDL 11 03/29/2009 0540   LDLCALC  Value: 50        Total Cholesterol/HDL:CHD Risk Coronary Heart Disease Risk Table                     Men   Women  1/2 Average Risk   3.4   3.3  Average Risk       5.0   4.4  2 X Average Risk   9.6   7.1  3 X Average Risk  23.4   11.0        Use the calculated Patient Ratio above and the CHD Risk Table to determine the patient's CHD Risk.        ATP III CLASSIFICATION (LDL):  <100     mg/dL   Optimal  914-782  mg/dL   Near or Above                    Optimal  130-159  mg/dL   Borderline  956-213  mg/dL   High  >086     mg/dL   Very High 11/27/8467 6295    BMET    Component Value Date/Time   NA 143 04/29/2013 2008   K 4.1 04/29/2013 2008   CL 105 04/29/2013 2008   CO2 23 04/29/2013 2008   GLUCOSE 104* 04/29/2013 2008   BUN 49* 04/29/2013 2008   CREATININE 1.91* 04/29/2013 2008   CREATININE 1.61* 03/24/2013 1524   CALCIUM 9.0 04/29/2013 2008   GFRNONAA 22* 04/29/2013  2008   GFRAA 25* 04/29/2013 2008     ASSESSMENT AND PLAN Acute on chronic diastolic congestive heart failure Despite her worsening renal function I think we have no choice but to increase the dose of diuretic. She is grossly hypervolemic. She has signs and symptoms of elevated left heart pressures and right heart pressures. Will increase the furosemide to 80 mg daily. Because of her renal function I do not think we need to increase the potassium any further. We'll recheck of the metastases next Tuesday and see her back in the office in a couple of weeks. By previous assessment, her dry weight was somewhere around 115 pounds.  Pacemaker - St. Jude, dual ch. programmed VVIR The changes we recently made to her pacemaker settings tablet to a marked reduction in the frequency of ventricular pacing, now down to 13% from about 77%. So far, this is not associated with any obvious improvement in clinical status. She does not have complaints of dizziness or syncope.  Atrial fibrillation, permanent On warfarin anticoagulation  Aortic stenosis- moderate to moderate/ severe 04/11/13    Mitral insufficiency Unfortunately, not a good candidate for surgical therapy  Coronary artery disease - S/P remote CABG x 4 (1980's) No complaints of angina. Her most recent functional study was a dobutamine stress echo that did not show evidence of active ischemia.   Orders Placed This Encounter  Procedures  . Basic Metabolic Panel (BMET)   Meds ordered this encounter  Medications  . furosemide (LASIX) 80 MG tablet  Sig: Take one tablet daily.    Dispense:  30 tablet    Refill:  5    Order Specific Question:  Supervising Provider    Answer:  Runell Gess [[id number]]  . LORazepam (ATIVAN) 0.5 MG tablet    Sig: Take 1 tablet (0.5 mg total) by mouth every 8 (eight) hours.    Dispense:  30 tablet    Refill:  0    Xian Alves  Thurmon Fair, MD, [facility] [phone] office [phone]  pager

## 2013-05-07 NOTE — Assessment & Plan Note (Signed)
Despite her worsening renal function I think we have no choice but to increase the dose of diuretic. She is grossly hypervolemic. She has signs and symptoms of elevated left heart pressures and right heart pressures. Will increase the furosemide to 80 mg daily. Because of her renal function I do not think we need to increase the potassium any further. We'll recheck of the metastases next Tuesday and see her back in the office in a couple of weeks. By previous assessment, her dry weight was somewhere around 115 pounds.

## 2013-05-07 NOTE — Assessment & Plan Note (Signed)
No complaints of angina. Her most recent functional study was a dobutamine stress echo that did not show evidence of active ischemia.

## 2013-05-07 NOTE — Assessment & Plan Note (Signed)
The changes we recently made to her pacemaker settings tablet to a marked reduction in the frequency of ventricular pacing, now down to 13% from about 77%. So far, this is not associated with any obvious improvement in clinical status. She does not have complaints of dizziness or syncope.

## 2013-05-07 NOTE — Assessment & Plan Note (Signed)
Unfortunately, not a good candidate for surgical therapy

## 2013-05-08 ENCOUNTER — Emergency Department (HOSPITAL_COMMUNITY): Payer: Medicare Other

## 2013-05-08 ENCOUNTER — Inpatient Hospital Stay (HOSPITAL_COMMUNITY)
Admission: EM | Admit: 2013-05-08 | Discharge: 2013-05-15 | DRG: 394 | Disposition: A | Discharge status: Skilled Nursing Facility | Payer: Medicare Other | Attending: Internal Medicine | Admitting: Internal Medicine

## 2013-05-08 ENCOUNTER — Encounter (HOSPITAL_COMMUNITY): Payer: Self-pay | Admitting: Emergency Medicine

## 2013-05-08 DIAGNOSIS — I340 Nonrheumatic mitral (valve) insufficiency: Secondary | ICD-10-CM

## 2013-05-08 DIAGNOSIS — Z950 Presence of cardiac pacemaker: Secondary | ICD-10-CM

## 2013-05-08 DIAGNOSIS — I5033 Acute on chronic diastolic (congestive) heart failure: Secondary | ICD-10-CM

## 2013-05-08 DIAGNOSIS — I498 Other specified cardiac arrhythmias: Secondary | ICD-10-CM

## 2013-05-08 DIAGNOSIS — Z66 Do not resuscitate: Secondary | ICD-10-CM | POA: Diagnosis present

## 2013-05-08 DIAGNOSIS — N898 Other specified noninflammatory disorders of vagina: Secondary | ICD-10-CM | POA: Diagnosis present

## 2013-05-08 DIAGNOSIS — D62 Acute posthemorrhagic anemia: Secondary | ICD-10-CM | POA: Diagnosis present

## 2013-05-08 DIAGNOSIS — I4821 Permanent atrial fibrillation: Secondary | ICD-10-CM

## 2013-05-08 DIAGNOSIS — K922 Gastrointestinal hemorrhage, unspecified: Secondary | ICD-10-CM

## 2013-05-08 DIAGNOSIS — I350 Nonrheumatic aortic (valve) stenosis: Secondary | ICD-10-CM

## 2013-05-08 DIAGNOSIS — Z79899 Other long term (current) drug therapy: Secondary | ICD-10-CM

## 2013-05-08 DIAGNOSIS — I359 Nonrheumatic aortic valve disorder, unspecified: Secondary | ICD-10-CM

## 2013-05-08 DIAGNOSIS — R079 Chest pain, unspecified: Secondary | ICD-10-CM

## 2013-05-08 DIAGNOSIS — Z951 Presence of aortocoronary bypass graft: Secondary | ICD-10-CM

## 2013-05-08 DIAGNOSIS — E7800 Pure hypercholesterolemia, unspecified: Secondary | ICD-10-CM | POA: Diagnosis present

## 2013-05-08 DIAGNOSIS — I2589 Other forms of chronic ischemic heart disease: Secondary | ICD-10-CM | POA: Diagnosis present

## 2013-05-08 DIAGNOSIS — R259 Unspecified abnormal involuntary movements: Secondary | ICD-10-CM

## 2013-05-08 DIAGNOSIS — K648 Other hemorrhoids: Principal | ICD-10-CM | POA: Diagnosis present

## 2013-05-08 DIAGNOSIS — Z7901 Long term (current) use of anticoagulants: Secondary | ICD-10-CM

## 2013-05-08 DIAGNOSIS — R188 Other ascites: Secondary | ICD-10-CM | POA: Diagnosis present

## 2013-05-08 DIAGNOSIS — I5040 Unspecified combined systolic (congestive) and diastolic (congestive) heart failure: Secondary | ICD-10-CM | POA: Diagnosis present

## 2013-05-08 DIAGNOSIS — I5032 Chronic diastolic (congestive) heart failure: Secondary | ICD-10-CM

## 2013-05-08 DIAGNOSIS — I4891 Unspecified atrial fibrillation: Secondary | ICD-10-CM | POA: Diagnosis present

## 2013-05-08 DIAGNOSIS — K624 Stenosis of anus and rectum: Secondary | ICD-10-CM | POA: Diagnosis present

## 2013-05-08 DIAGNOSIS — H9190 Unspecified hearing loss, unspecified ear: Secondary | ICD-10-CM | POA: Diagnosis present

## 2013-05-08 DIAGNOSIS — R791 Abnormal coagulation profile: Secondary | ICD-10-CM

## 2013-05-08 DIAGNOSIS — N183 Chronic kidney disease, stage 3 unspecified: Secondary | ICD-10-CM | POA: Diagnosis present

## 2013-05-08 DIAGNOSIS — Z7982 Long term (current) use of aspirin: Secondary | ICD-10-CM

## 2013-05-08 DIAGNOSIS — K644 Residual hemorrhoidal skin tags: Secondary | ICD-10-CM | POA: Diagnosis present

## 2013-05-08 DIAGNOSIS — D649 Anemia, unspecified: Secondary | ICD-10-CM

## 2013-05-08 DIAGNOSIS — N2889 Other specified disorders of kidney and ureter: Secondary | ICD-10-CM

## 2013-05-08 DIAGNOSIS — I080 Rheumatic disorders of both mitral and aortic valves: Secondary | ICD-10-CM | POA: Diagnosis present

## 2013-05-08 DIAGNOSIS — I472 Ventricular tachycardia, unspecified: Secondary | ICD-10-CM

## 2013-05-08 DIAGNOSIS — I509 Heart failure, unspecified: Secondary | ICD-10-CM | POA: Diagnosis present

## 2013-05-08 DIAGNOSIS — I428 Other cardiomyopathies: Secondary | ICD-10-CM

## 2013-05-08 DIAGNOSIS — I495 Sick sinus syndrome: Secondary | ICD-10-CM

## 2013-05-08 DIAGNOSIS — I2582 Chronic total occlusion of coronary artery: Secondary | ICD-10-CM | POA: Diagnosis present

## 2013-05-08 DIAGNOSIS — I2510 Atherosclerotic heart disease of native coronary artery without angina pectoris: Secondary | ICD-10-CM

## 2013-05-08 DIAGNOSIS — K625 Hemorrhage of anus and rectum: Secondary | ICD-10-CM

## 2013-05-08 LAB — CBC WITH DIFFERENTIAL/PLATELET
Basophils Absolute: 0 10*3/uL (ref 0.0–0.1)
Basophils Relative: 0 % (ref 0–1)
Eosinophils Absolute: 0.1 10*3/uL (ref 0.0–0.7)
Eosinophils Relative: 3 % (ref 0–5)
HCT: 26.1 % — ABNORMAL LOW (ref 36.0–46.0)
Lymphocytes Relative: 21 % (ref 12–46)
Lymphs Abs: 0.9 10*3/uL (ref 0.7–4.0)
MCHC: 33.7 g/dL (ref 30.0–36.0)
MCV: 93.5 fL (ref 78.0–100.0)
Monocytes Absolute: 0.4 10*3/uL (ref 0.1–1.0)
Platelets: 236 10*3/uL (ref 150–400)
RBC: 2.79 MIL/uL — ABNORMAL LOW (ref 3.87–5.11)
RDW: 17.5 % — ABNORMAL HIGH (ref 11.5–15.5)
WBC: 4.4 10*3/uL (ref 4.0–10.5)

## 2013-05-08 LAB — HEPATIC FUNCTION PANEL
AST: 31 U/L (ref 0–37)
Bilirubin, Direct: 0.8 mg/dL — ABNORMAL HIGH (ref 0.0–0.3)
Indirect Bilirubin: 1.3 mg/dL — ABNORMAL HIGH (ref 0.3–0.9)
Total Bilirubin: 2.1 mg/dL — ABNORMAL HIGH (ref 0.3–1.2)

## 2013-05-08 LAB — URINALYSIS, ROUTINE W REFLEX MICROSCOPIC
Bilirubin Urine: NEGATIVE
Glucose, UA: NEGATIVE mg/dL
Hgb urine dipstick: NEGATIVE
Ketones, ur: NEGATIVE mg/dL
Nitrite: NEGATIVE
Protein, ur: NEGATIVE mg/dL
Specific Gravity, Urine: 1.008 (ref 1.005–1.030)
Urobilinogen, UA: 0.2 mg/dL (ref 0.0–1.0)

## 2013-05-08 LAB — PROTIME-INR
INR: 3.94 — ABNORMAL HIGH (ref 0.00–1.49)
Prothrombin Time: 37 seconds — ABNORMAL HIGH (ref 11.6–15.2)

## 2013-05-08 LAB — BASIC METABOLIC PANEL
Calcium: 9.1 mg/dL (ref 8.4–10.5)
Creatinine, Ser: 1.77 mg/dL — ABNORMAL HIGH (ref 0.50–1.10)
GFR calc Af Amer: 27 mL/min — ABNORMAL LOW (ref >90)
GFR calc non Af Amer: 24 mL/min — ABNORMAL LOW (ref >90)
Sodium: 142 mEq/L (ref 135–145)

## 2013-05-08 MED ORDER — PHYTONADIONE 5 MG PO TABS
2.5000 mg | ORAL_TABLET | Freq: Once | ORAL | Status: AC
  Administered 2013-05-08: 2.5 mg via ORAL
  Filled 2013-05-08: qty 1

## 2013-05-08 MED ORDER — SODIUM CHLORIDE 0.9 % IJ SOLN
3.0000 mL | Freq: Two times a day (BID) | INTRAMUSCULAR | Status: DC
  Administered 2013-05-08 – 2013-05-15 (×10): 3 mL via INTRAVENOUS

## 2013-05-08 MED ORDER — EZETIMIBE 10 MG PO TABS
10.0000 mg | ORAL_TABLET | Freq: Every day | ORAL | Status: DC
  Administered 2013-05-09 – 2013-05-15 (×7): 10 mg via ORAL
  Filled 2013-05-08 (×7): qty 1

## 2013-05-08 MED ORDER — DONEPEZIL HCL 5 MG PO TABS
5.0000 mg | ORAL_TABLET | Freq: Every day | ORAL | Status: DC
  Administered 2013-05-08 – 2013-05-14 (×7): 5 mg via ORAL
  Filled 2013-05-08 (×8): qty 1

## 2013-05-08 MED ORDER — PANTOPRAZOLE SODIUM 40 MG PO TBEC
40.0000 mg | DELAYED_RELEASE_TABLET | Freq: Every day | ORAL | Status: DC
  Administered 2013-05-08 – 2013-05-15 (×8): 40 mg via ORAL
  Filled 2013-05-08 (×8): qty 1

## 2013-05-08 MED ORDER — SODIUM CHLORIDE 0.9 % IV BOLUS (SEPSIS): 1000.0000 mL | Freq: Once | INTRAVENOUS | Status: DC

## 2013-05-08 MED ORDER — SODIUM CHLORIDE 0.9 % IV BOLUS (SEPSIS)
500.0000 mL | Freq: Once | INTRAVENOUS | Status: AC
  Administered 2013-05-08: 500 mL via INTRAVENOUS

## 2013-05-08 MED ORDER — ISOSORBIDE DINITRATE 10 MG PO TABS
10.0000 mg | ORAL_TABLET | Freq: Two times a day (BID) | ORAL | Status: DC
  Administered 2013-05-08 – 2013-05-15 (×14): 10 mg via ORAL
  Filled 2013-05-08 (×15): qty 1

## 2013-05-08 MED ORDER — LORAZEPAM 0.5 MG PO TABS
0.5000 mg | ORAL_TABLET | Freq: Three times a day (TID) | ORAL | Status: DC
  Administered 2013-05-08 – 2013-05-14 (×16): 0.5 mg via ORAL
  Filled 2013-05-08 (×17): qty 1

## 2013-05-08 MED ORDER — PANTOPRAZOLE SODIUM 40 MG IV SOLR
40.0000 mg | Freq: Once | INTRAVENOUS | Status: AC
  Administered 2013-05-08: 40 mg via INTRAVENOUS
  Filled 2013-05-08: qty 40

## 2013-05-08 MED ORDER — IOHEXOL 300 MG/ML  SOLN
25.0000 mL | INTRAMUSCULAR | Status: AC
  Administered 2013-05-08: 25 mL via ORAL

## 2013-05-08 MED ORDER — HYDRALAZINE HCL 25 MG PO TABS
25.0000 mg | ORAL_TABLET | Freq: Two times a day (BID) | ORAL | Status: DC
  Administered 2013-05-08 – 2013-05-15 (×14): 25 mg via ORAL
  Filled 2013-05-08 (×15): qty 1

## 2013-05-08 NOTE — Progress Notes (Addendum)
Pt arrived from ED via stretcher. Pt's hearing aids and glasses at bedside. Tele box 21 was placed on pt, scds were ordered, Pt was cleaned up, food was given, and safety video has been viewed. Vss. Pt in no apparent distress at this time. rn will continue to monitor pt.

## 2013-05-08 NOTE — ED Notes (Signed)
Ct brought 2nd cup of contrast due to kidney function.

## 2013-05-08 NOTE — ED Notes (Signed)
Pt hearing aids removed and given to family.

## 2013-05-08 NOTE — ED Provider Notes (Signed)
CSN: [id number]     Arrival date & time 05/08/13  1326 History   First MD Initiated Contact with Patient 05/08/13 1345     Chief Complaint  Patient presents with  . Rectal Bleeding   (Consider location/radiation/quality/duration/timing/severity/associated sxs/prior Treatment) HPI Comments: Patient arrives by EMS complaining of rectal bleeding since last night. She is unable to quantify the amount. She is very hard of hearing. She is on Coumadin for history visual fibrillation. Denies any vomiting denies abdominal pain or fever. She has intermittent dizziness and lightheadedness. No chest pain or shortness of breath. No previous history of rectal bleeding.  The history is provided by the patient. The history is limited by a language barrier.    Past Medical History  Diagnosis Date  . CHF (congestive heart failure)     EF 30-35% on 2D Echo in July 2012  . High cholesterol   . Atrial fibrillation     rate controlled, on chronic coumadin therapy  . Sick sinus syndrome     s/p pacemaker  . Chronic kidney disease (CKD), stage III (moderate)   . Anemia     baseline 7.8-9.0  . Coronary artery disease     stent in 2008 shows CABG to LAD, RCA and circumflex with 100% occlusion  . Heart murmur   . Mitral regurgitation     severe MR,EF 30-35%  . Ischemic cardiomyopathy   . Aortic stenosis     severe  . Renal insufficiency, mild    Past Surgical History  Procedure Laterality Date  . Exploratory laparotomy w/ bowel resection  2/12    in setting of SBO  . Pacemaker insertion  03/30/2009    St.Jude  Zephyr  . R breast lumpectomy  05/2004  . Ureterolithotomy  01/2002  . Insert / replace / remove pacemaker    . Coronary artery bypass graft  1980's   No family history on file. History  Substance Use Topics  . Smoking status: Never Smoker   . Smokeless tobacco: Never Used  . Alcohol Use: No   OB History   Grav Para Term Preterm Abortions TAB SAB Ect Mult Living                  Review of Systems  Unable to perform ROS: Age  Gastrointestinal: Positive for hematochezia.    Allergies  Penicillins; Simvastatin; Ace inhibitors; Amlodipine besylate; Aspirin; Food; and Nutritional supplements  Home Medications   Current Outpatient Rx  Name  Route  Sig  Dispense  Refill  . donepezil (ARICEPT) 5 MG tablet   Oral   Take 5 mg by mouth at bedtime.         Marland Kitchen ezetimibe (ZETIA) 10 MG tablet   Oral   Take 10 mg by mouth daily.           . furosemide (LASIX) 80 MG tablet      Take one tablet daily.   30 tablet   5   . hydrALAZINE (APRESOLINE) 25 MG tablet   Oral   Take 1 tablet (25 mg total) by mouth every 12 (twelve) hours.   60 tablet   5   . isosorbide dinitrate (ISORDIL) 10 MG tablet   Oral   Take 1 tablet (10 mg total) by mouth 2 (two) times daily.   60 tablet   5   . LORazepam (ATIVAN) 0.5 MG tablet   Oral   Take 1 tablet (0.5 mg total) by mouth every 8 (eight) hours.  30 tablet   0   . nitroGLYCERIN (NITROSTAT) 0.4 MG SL tablet   Sublingual   Place 1 tablet (0.4 mg total) under the tongue every 5 (five) minutes as needed for chest pain.   20 tablet   0   . pantoprazole (PROTONIX) 40 MG tablet   Oral   Take 40 mg by mouth daily.          . potassium chloride (KLOR-CON M10) 10 MEQ tablet   Oral   Take 1 tablet (10 mEq total) by mouth daily.   30 tablet   6   . warfarin (COUMADIN) 5 MG tablet   Oral   Take 2.5 mg by mouth daily.          BP 134/69  Pulse 70  Temp(Src) 97.4 F (36.3 C) (Oral)  Resp 20  SpO2 93% Physical Exam  Constitutional: She appears well-developed and well-nourished. No distress.  HENT:  Head: Normocephalic and atraumatic.  Mouth/Throat: Oropharynx is clear and moist. No oropharyngeal exudate.  Eyes: Conjunctivae and EOM are normal. Pupils are equal, round, and reactive to light.  Neck: Normal range of motion. Neck supple.  Cardiovascular: Normal rate, regular rhythm and normal heart sounds.    No murmur heard. Pulmonary/Chest: Effort normal and breath sounds normal. No respiratory distress.  Abdominal: Soft. She exhibits distension. There is no tenderness. There is no rebound and no guarding.  Genitourinary: Guaiac positive stool.  Small external hemorrhoids, no fissures, blood on examining finger without stool  Musculoskeletal: Normal range of motion. She exhibits no edema and no tenderness.  Neurological: She is alert. No cranial nerve deficit. Coordination normal.  Skin: Skin is warm.    ED Course  Procedures (including critical care time) Labs Review Labs Reviewed  CBC WITH DIFFERENTIAL - Abnormal; Notable for the following:    RBC 2.79 (*)    Hemoglobin 8.8 (*)    HCT 26.1 (*)    RDW 17.5 (*)    All other components within normal limits  BASIC METABOLIC PANEL - Abnormal; Notable for the following:    BUN 45 (*)    Creatinine, Ser 1.77 (*)    GFR calc non Af Amer 24 (*)    GFR calc Af Amer 27 (*)    All other components within normal limits  PROTIME-INR - Abnormal; Notable for the following:    Prothrombin Time 37.0 (*)    INR 3.94 (*)    All other components within normal limits  OCCULT BLOOD, POC DEVICE - Abnormal; Notable for the following:    Fecal Occult Bld POSITIVE (*)    All other components within normal limits  URINALYSIS, ROUTINE W REFLEX MICROSCOPIC  TYPE AND SCREEN   Imaging Review Ct Abdomen Pelvis Wo Contrast  05/08/2013   CLINICAL DATA:  Diffuse abdominal pain, rectal bleeding.  EXAM: CT ABDOMEN AND PELVIS WITHOUT CONTRAST  TECHNIQUE: Multidetector CT imaging of the abdomen and pelvis was performed following the standard protocol without intravenous contrast.  COMPARISON:  The CT to 17 2012  FINDINGS: There are bilateral small pleural effusions. No focal consolidation at the lung bases. Heart is enlarged. No pericardial fluid.  There is interval development of a moderate to large volume of intraperitoneal free fluid throughout the abdomen and  pelvis. This fluid is low attenuation suggesting ascites. This makes evaluation of the abdominal and pelvic contents difficult on this non contrast exam.  Oral contrast does pass through the entirety of the bowel to the rectum. No mass  lesion or obstruction identified. Liver is grossly normal. The pancreas, spleen, adrenal glands and kidneys are grossly normal.  There is a uterus is normal. The bladder is distended but appears normal.  The abdominal aorta and renal arteries are heavily calcified. No aneurysm. Posterior lumbar fusion noted.  IMPRESSION: 1. Large volume of intraperitoneal free fluid consists with ascites.  2. No evidence of bowel obstruction.  3.  Small bilateral pleural effusions.   Electronically Signed   By: Genevive Bi M.D.   On: 05/08/2013 18:20   Dg Abd Acute W/chest  05/08/2013   CLINICAL DATA:  Rectal bleeding.  EXAM: ACUTE ABDOMEN SERIES (ABDOMEN 2 VIEW & CHEST 1 VIEW)  COMPARISON:  Chest x-ray 04/14/2013.  FINDINGS: The upright chest x-ray demonstrates stable cardiac enlargement and moderate vascular congestion. The pacer wire is stable. Small pleural effusions are suspected.  Two views of the abdomen demonstrate air-filled small bowel loops in with scattered air-fluid levels in but no significant distention. This could reflect an early small bowel obstruction. Very little colonic air. Overall increased density and slight centralization of the bowel loops could be due to ascites. No free air. The soft tissue shadows are grossly maintained. The bony structures are intact. Lumbar fusion hardware is noted. Extensive vascular calcifications are present.  IMPRESSION: Cardiac enlargement and mild vascular congestion.  Possible early small bowel obstruction and possible ascites.   Electronically Signed   By: Loralie Champagne M.D.   On: 05/08/2013 14:43    EKG Interpretation     Ventricular Rate:  64 PR Interval:    QRS Duration: 115 QT Interval:  441 QTC Calculation: 430 R  Axis:   90 Text Interpretation:  Atrial fibrillation Nonspecific intraventricular conduction delay Anteroseptal infarct, age indeterminate No significant change was found            MDM  No diagnosis found. Rectal bleeding since last night, unable to quantify.  No abdominal pain or rectal pain.  No history of same. On coumadin for A fib  Abdomen soft without peritoneal signs.  External hemorrhoids with gross blood on examining finger. Hemoglobin stable.  Vitals stable. INR 3.9 Will not emergently reverse INR as hemodynamically stable and bleeding controlled.  Suspect diverticular bleeding.  Family reports never had a colonoscopy. Cautious hydration as patient already appears volume overloaded. CT pending at time of sign out to Dr. Patria Mane.      Glynn Octave, MD 05/08/13 [phone]

## 2013-05-08 NOTE — ED Notes (Signed)
Pt taking coumadin at home.

## 2013-05-08 NOTE — ED Notes (Signed)
Per EMS-- pt from home with c/o of rectal bleeding since last night. Blood is bright red with foul smell. Pt also reports dizziness that comes and goes. Pt also reporting she is beginning to get a headache.

## 2013-05-08 NOTE — ED Notes (Signed)
Pt transported to xray 

## 2013-05-08 NOTE — ED Provider Notes (Signed)
7:30 PM CT scan demonstrates ascites which has had before in the past but no other acute pathology.  Doubt SBP.  Patient be admitted for lower GI bleed.  Oral vitamin K given to slowly reduce her INR.  Supratherapeutic INR 3.9.  Normal vital signs at this time.  No ongoing bleeding to suggest need for emergent FFP.  She'll need to be monitored closely  Lyanne Co, MD 05/08/13 1931

## 2013-05-08 NOTE — H&P (Signed)
[facility] History and Physical  [name]:[id number] DOB: 1918-01-28 DOA: 05/08/2013  Referring physician: ED PCP: No PCP Per Patient  Chief Complaint: BRBPR  HPI: [name] is a 79 y.o. female on chronic coumadin for A.Fib who presents to the ED with c/o rectal bleeding.  Symptoms onset last night, she is unable to qualify the amount but states no clots were present.  No abd pain, no fever, no vomiting.  She does note dizziness that comes and goes.  Small amount of blood noted on glove during EDPs rectal exam, external hemorrhoids noted but none appear to be the source of bleeding.  Review of Systems: Unable to perform due to hearing difficulty.  Past Medical History  Diagnosis Date  . CHF (congestive heart failure)     EF 30-35% on 2D Echo in July 2012  . High cholesterol   . Atrial fibrillation     rate controlled, on chronic coumadin therapy  . Sick sinus syndrome     s/p pacemaker  . Chronic kidney disease (CKD), stage III (moderate)   . Anemia     baseline 7.8-9.0  . Coronary artery disease     stent in 2008 shows CABG to LAD, RCA and circumflex with 100% occlusion  . Heart murmur   . Mitral regurgitation     severe MR,EF 30-35%  . Ischemic cardiomyopathy   . Aortic stenosis     severe  . Renal insufficiency, mild    Past Surgical History  Procedure Laterality Date  . Exploratory laparotomy w/ bowel resection  2/12    in setting of SBO  . Pacemaker insertion  03/30/2009    [employer]  [facility]  . R breast lumpectomy  05/2004  . Ureterolithotomy  01/2002  . Insert / replace / remove pacemaker    . Coronary artery bypass graft  1980's   Social History:  reports that she has never smoked. She has never used smokeless tobacco. She reports that she does not drink alcohol or use illicit drugs.   Allergies  Allergen Reactions  . Penicillins Swelling  . Simvastatin Other (See Comments)    Myalgia   . Ace Inhibitors Cough  . Amlodipine Besylate  Rash  . Aspirin Nausea Only, Rash and Other (See Comments)    GI distress  . Food Other (See Comments)    Berries,tomato  . Nutritional Supplements Other (See Comments)    Unknown.    No family history on file.  Prior to Admission medications   Medication Sig Start Date End Date Taking? Authorizing Provider  donepezil (ARICEPT) 5 MG tablet Take 5 mg by mouth at bedtime.    Historical Provider, MD  ezetimibe (ZETIA) 10 MG tablet Take 10 mg by mouth daily.      Historical Provider, MD  furosemide (LASIX) 80 MG tablet Take one tablet daily. 05/07/13   Mihai Croitoru, MD  hydrALAZINE (APRESOLINE) 25 MG tablet Take 1 tablet (25 mg total) by mouth every 12 (twelve) hours. 04/16/13   Wilburt Finlay, PA-C  isosorbide dinitrate (ISORDIL) 10 MG tablet Take 1 tablet (10 mg total) by mouth 2 (two) times daily. 04/16/13   Wilburt Finlay, PA-C  LORazepam (ATIVAN) 0.5 MG tablet Take 1 tablet (0.5 mg total) by mouth every 8 (eight) hours. 05/07/13   Mihai Croitoru, MD  nitroGLYCERIN (NITROSTAT) 0.4 MG SL tablet Place 1 tablet (0.4 mg total) under the tongue every 5 (five) minutes as needed for chest pain. 09/08/12   Richardean Canal,  MD  pantoprazole (PROTONIX) 40 MG tablet Take 40 mg by mouth daily.     Historical Provider, MD  potassium chloride (KLOR-CON M10) 10 MEQ tablet Take 1 tablet (10 mEq total) by mouth daily. 04/23/13   Mihai Croitoru, MD  warfarin (COUMADIN) 5 MG tablet Take 2.5 mg by mouth daily.    Historical Provider, MD   Physical Exam: Filed Vitals:   05/08/13 2059  BP: 151/67  Pulse: 62  Temp:   Resp: 18    General:  NAD, resting comfortably in bed Eyes: PEERLA EOMI ENT: mucous membranes moist Neck: supple w/o JVD Cardiovascular: RRR very faint murmur, suggestive of severe AS Respiratory: CTA B Abdomen: soft, nt, nd, bs+ Skin: no rash nor lesion Musculoskeletal: MAE, full ROM all 4 extremities Psychiatric: normal tone and affect Neurologic: AAOx3, grossly non-focal  Labs on Admission:   Basic Metabolic Panel:  Recent Labs Lab 05/08/13 1353  NA 142  K 4.4  CL 106  CO2 19  GLUCOSE 91  BUN 45*  CREATININE 1.77*  CALCIUM 9.1   Liver Function Tests:  Recent Labs Lab 05/08/13 1954  AST 31  ALT 13  ALKPHOS 183*  BILITOT 2.1*  PROT 6.6  ALBUMIN 3.7   No results found for this basename: LIPASE, AMYLASE,  in the last 168 hours No results found for this basename: AMMONIA,  in the last 168 hours CBC:  Recent Labs Lab 05/08/13 1353  WBC 4.4  NEUTROABS 3.0  HGB 8.8*  HCT 26.1*  MCV 93.5  PLT 236   Cardiac Enzymes: No results found for this basename: CKTOTAL, CKMB, CKMBINDEX, TROPONINI,  in the last 168 hours  BNP (last 3 results)  Recent Labs  04/12/13 0500 04/14/13 0605 04/29/13 2007  PROBNP 11247.0* 12470.0* 9884.0*   CBG: No results found for this basename: GLUCAP,  in the last 168 hours  Radiological Exams on Admission: Ct Abdomen Pelvis Wo Contrast  05/08/2013   CLINICAL DATA:  Diffuse abdominal pain, rectal bleeding.  EXAM: CT ABDOMEN AND PELVIS WITHOUT CONTRAST  TECHNIQUE: Multidetector CT imaging of the abdomen and pelvis was performed following the standard protocol without intravenous contrast.  COMPARISON:  The CT to 17 2012  FINDINGS: There are bilateral small pleural effusions. No focal consolidation at the lung bases. Heart is enlarged. No pericardial fluid.  There is interval development of a moderate to large volume of intraperitoneal free fluid throughout the abdomen and pelvis. This fluid is low attenuation suggesting ascites. This makes evaluation of the abdominal and pelvic contents difficult on this non contrast exam.  Oral contrast does pass through the entirety of the bowel to the rectum. No mass lesion or obstruction identified. Liver is grossly normal. The pancreas, spleen, adrenal glands and kidneys are grossly normal.  There is a uterus is normal. The bladder is distended but appears normal.  The abdominal aorta and renal  arteries are heavily calcified. No aneurysm. Posterior lumbar fusion noted.  IMPRESSION: 1. Large volume of intraperitoneal free fluid consists with ascites.  2. No evidence of bowel obstruction.  3.  Small bilateral pleural effusions.   Electronically Signed   By: Genevive Bi M.D.   On: 05/08/2013 18:20   Dg Abd Acute W/chest  05/08/2013   CLINICAL DATA:  Rectal bleeding.  EXAM: ACUTE ABDOMEN SERIES (ABDOMEN 2 VIEW & CHEST 1 VIEW)  COMPARISON:  Chest x-ray 04/14/2013.  FINDINGS: The upright chest x-ray demonstrates stable cardiac enlargement and moderate vascular congestion. The pacer wire is stable. Small  pleural effusions are suspected.  Two views of the abdomen demonstrate air-filled small bowel loops in with scattered air-fluid levels in but no significant distention. This could reflect an early small bowel obstruction. Very little colonic air. Overall increased density and slight centralization of the bowel loops could be due to ascites. No free air. The soft tissue shadows are grossly maintained. The bony structures are intact. Lumbar fusion hardware is noted. Extensive vascular calcifications are present.  IMPRESSION: Cardiac enlargement and mild vascular congestion.  Possible early small bowel obstruction and possible ascites.   Electronically Signed   By: Loralie Champagne M.D.   On: 05/08/2013 14:43    EKG: Independently reviewed.  Assessment/Plan Principal Problem:   BRBPR (bright red blood per rectum) Active Problems:   Chronic anticoagulation - on Warfarin   Aortic stenosis- moderate to moderate/ severe 04/11/13   Anemia   1. BRBPR - lower GI bleed, most likely from internal hemorrhoids, could also be from another source such as diverticula, neoplasm, or AVM as well.  Patient hemodynamically stable at this time, holding lasix, NPO after midnight, needs GI consult in AM.  No fluids at this time since she is stable, caution with fluids as she has very severe AS though we will of  course have to give these if her bleeding becomes more severe. 2. Anemia - Chronic and her HGB of 8.8 appears to be her baseline, repeat CBC in AM, no indication for transfusion at this time. 3. Chronic anticoagulation - holding coumadin and using vit K 5 mg total to reverse this in setting of acute bleed.   Code Status: Full (must indicate code status--if unknown or must be presumed, indicate so) Family Communication: Family at bedside (indicate person spoken with, if applicable, with phone number if by telephone) Disposition Plan: Admit to inpatient (indicate anticipated LOS)  Time spent: 70 min  GARDNER, JARED M. [facility] Pager [phone]  If 7PM-7AM, please contact night-coverage [email or web address] Password [facility] 05/08/2013, 9:27 PM

## 2013-05-08 NOTE — ED Notes (Signed)
X ray informed of need of urine specimen. They will collect.

## 2013-05-08 NOTE — ED Notes (Signed)
Pt returned from xray

## 2013-05-08 NOTE — ED Notes (Addendum)
No urine brought back from x ray. Will attempt again for urine.

## 2013-05-08 NOTE — ED Notes (Signed)
Pt finished with contrast. CT aware.

## 2013-05-09 DIAGNOSIS — I5032 Chronic diastolic (congestive) heart failure: Secondary | ICD-10-CM

## 2013-05-09 LAB — CBC
HCT: 22.8 % — ABNORMAL LOW (ref 36.0–46.0)
HCT: 24.7 % — ABNORMAL LOW (ref 36.0–46.0)
Hemoglobin: 7.7 g/dL — ABNORMAL LOW (ref 12.0–15.0)
MCH: 31.2 pg (ref 26.0–34.0)
MCH: 31.1 pg (ref 26.0–34.0)
MCHC: 33.8 g/dL (ref 30.0–36.0)
MCHC: 34 g/dL (ref 30.0–36.0)
MCV: 92.3 fL (ref 78.0–100.0)
MCV: 91.5 fL (ref 78.0–100.0)
RDW: 17.8 % — ABNORMAL HIGH (ref 11.5–15.5)
WBC: 3.4 10*3/uL — ABNORMAL LOW (ref 4.0–10.5)
WBC: 4.6 10*3/uL (ref 4.0–10.5)

## 2013-05-09 LAB — BASIC METABOLIC PANEL
BUN: 45 mg/dL — ABNORMAL HIGH (ref 6–23)
Calcium: 8.8 mg/dL (ref 8.4–10.5)
Creatinine, Ser: 1.89 mg/dL — ABNORMAL HIGH (ref 0.50–1.10)
GFR calc non Af Amer: 22 mL/min — ABNORMAL LOW (ref >90)
Glucose, Bld: 84 mg/dL (ref 70–99)
Sodium: 142 mEq/L (ref 135–145)

## 2013-05-09 LAB — PROTIME-INR: INR: 2.37 — ABNORMAL HIGH (ref 0.00–1.49)

## 2013-05-09 MED ORDER — INFLUENZA VAC SPLIT QUAD 0.5 ML IM SUSP
0.5000 mL | INTRAMUSCULAR | Status: AC
Start: 2013-05-10 — End: 2013-05-10
  Administered 2013-05-10: 0.5 mL via INTRAMUSCULAR
  Filled 2013-05-09: qty 0.5

## 2013-05-09 MED ORDER — FUROSEMIDE 10 MG/ML IJ SOLN
INTRAMUSCULAR | Status: AC
  Filled 2013-05-09: qty 4

## 2013-05-09 MED ORDER — FUROSEMIDE 10 MG/ML IJ SOLN
20.0000 mg | Freq: Once | INTRAMUSCULAR | Status: AC
  Administered 2013-05-09: 12:00:00 20 mg via INTRAVENOUS

## 2013-05-09 NOTE — Progress Notes (Signed)
[facility] PROGRESS NOTE  BREEANNE OBLINGER ZOX:[id number] DOB: [birth year] DOA: 05/08/2013 PCP: No PCP Per Patient  HPI/Subjective: Very hard of hearing. No reported bowel movement this morning.  Assessment/Plan: Principal Problem:   BRBPR (bright red blood per rectum) Active Problems:   Chronic anticoagulation - on Warfarin   Aortic stenosis- moderate to moderate/ severe 04/11/13   Anemia   BRBPR -Rectal bleeding likely secondary to hemorrhoids versus diverticular bleed. -Patient has supratherapeutic INR, of 3.9, INR checked yesterday we'll recheck INR. -GI consulted.  Anemia -Acute blood loss anemia on background of chronic anemia. -Hemoglobin baseline is 8.8, hemoglobin dropped to 7.7. -I will transfuse one unit of packed RBCs, give Lasix with it. -Patient has systolic CHF.  Supratherapeutic INR -Patient received vitamin K yesterday, recheck INR today.  Aortic stenosis. -Aortic valve stenosis and mitral valve regurgitation.  Severe pulmonary hypertension -Continue home medications, does not appear short of breath.  Code Status: Full code Family Communication: Plan discussed with the patient. Disposition Plan: Remains inpatient   Consultants:  Dr. Bosie Clos of [facility]  Procedures:  None  Antibiotics:  None   Objective: Filed Vitals:   05/09/13 0542  BP: 130/62  Pulse: 63  Temp: 97.3 F (36.3 C)  Resp:    No intake or output data in the 24 hours ending 05/09/13 1048 There were no vitals filed for this visit.  Exam: General: Sleeping this morning, very hard of hearing. HEENT: anicteric sclera, pupils reactive to light and accommodation, EOMI CVS: S1-S2 clear, no murmur rubs or gallops Chest: clear to auscultation bilaterally, no wheezing, rales or rhonchi Abdomen: soft nontender, nondistended, normal bowel sounds, no organomegaly Extremities: no cyanosis, clubbing or edema noted bilaterally Neuro: Cranial nerves II-XII intact, no focal  neurological deficits  Data Reviewed: Basic Metabolic Panel:  Recent Labs Lab 05/08/13 1353 05/09/13 0624  NA 142 142  K 4.4 4.1  CL 106 107  CO2 19 21  GLUCOSE 91 84  BUN 45* 45*  CREATININE 1.77* 1.89*  CALCIUM 9.1 8.8   Liver Function Tests:  Recent Labs Lab 05/08/13 1954  AST 31  ALT 13  ALKPHOS 183*  BILITOT 2.1*  PROT 6.6  ALBUMIN 3.7   No results found for this basename: LIPASE, AMYLASE,  in the last 168 hours No results found for this basename: AMMONIA,  in the last 168 hours CBC:  Recent Labs Lab 05/08/13 1353 05/09/13 0624  WBC 4.4 3.4*  NEUTROABS 3.0  --   HGB 8.8* 7.7*  HCT 26.1* 22.8*  MCV 93.5 92.3  PLT 236 194   Cardiac Enzymes: No results found for this basename: CKTOTAL, CKMB, CKMBINDEX, TROPONINI,  in the last 168 hours BNP (last 3 results)  Recent Labs  04/12/13 0500 04/14/13 0605 04/29/13 2007  PROBNP 11247.0* 12470.0* 9884.0*   CBG: No results found for this basename: GLUCAP,  in the last 168 hours  Micro No results found for this or any previous visit (from the past 240 hour(s)).   Studies: Ct Abdomen Pelvis Wo Contrast  05/08/2013   CLINICAL DATA:  Diffuse abdominal pain, rectal bleeding.  EXAM: CT ABDOMEN AND PELVIS WITHOUT CONTRAST  TECHNIQUE: Multidetector CT imaging of the abdomen and pelvis was performed following the standard protocol without intravenous contrast.  COMPARISON:  The CT to 17 2012  FINDINGS: There are bilateral small pleural effusions. No focal consolidation at the lung bases. Heart is enlarged. No pericardial fluid.  There is interval development of a moderate to large volume of  intraperitoneal free fluid throughout the abdomen and pelvis. This fluid is low attenuation suggesting ascites. This makes evaluation of the abdominal and pelvic contents difficult on this non contrast exam.  Oral contrast does pass through the entirety of the bowel to the rectum. No mass lesion or obstruction identified. Liver is  grossly normal. The pancreas, spleen, adrenal glands and kidneys are grossly normal.  There is a uterus is normal. The bladder is distended but appears normal.  The abdominal aorta and renal arteries are heavily calcified. No aneurysm. Posterior lumbar fusion noted.  IMPRESSION: 1. Large volume of intraperitoneal free fluid consists with ascites.  2. No evidence of bowel obstruction.  3.  Small bilateral pleural effusions.   Electronically Signed   By: Genevive Bi M.D.   On: 05/08/2013 18:20   Dg Abd Acute W/chest  05/08/2013   CLINICAL DATA:  Rectal bleeding.  EXAM: ACUTE ABDOMEN SERIES (ABDOMEN 2 VIEW & CHEST 1 VIEW)  COMPARISON:  Chest x-ray 04/14/2013.  FINDINGS: The upright chest x-ray demonstrates stable cardiac enlargement and moderate vascular congestion. The pacer wire is stable. Small pleural effusions are suspected.  Two views of the abdomen demonstrate air-filled small bowel loops in with scattered air-fluid levels in but no significant distention. This could reflect an early small bowel obstruction. Very little colonic air. Overall increased density and slight centralization of the bowel loops could be due to ascites. No free air. The soft tissue shadows are grossly maintained. The bony structures are intact. Lumbar fusion hardware is noted. Extensive vascular calcifications are present.  IMPRESSION: Cardiac enlargement and mild vascular congestion.  Possible early small bowel obstruction and possible ascites.   Electronically Signed   By: Loralie Champagne M.D.   On: 05/08/2013 14:43    Scheduled Meds: . donepezil  5 mg Oral QHS  . ezetimibe  10 mg Oral Daily  . hydrALAZINE  25 mg Oral Q12H  . isosorbide dinitrate  10 mg Oral BID  . LORazepam  0.5 mg Oral Q8H  . pantoprazole  40 mg Oral Daily  . sodium chloride  3 mL Intravenous Q12H   Continuous Infusions:      Time spent: 35 minutes    [facility] A  [facility] Pager [phone] If 7PM-7AM, please contact  night-coverage at [email or web address], password [facility] 05/09/2013, 10:48 AM  LOS: 1 day

## 2013-05-09 NOTE — Consult Note (Addendum)
Referring Provider: Dr. Arthor Captain Primary Care Physician:  No PCP Per Patient Primary Gastroenterologist:  Gentry Fitz  Reason for Consultation:  Rectal bleeding  HPI: [name] is a 79 y.o. female seen for a consult due to rectal bleeding. Pt.'s granddaughter reports patient had red blood coming from rectum prior to admit. Patient unable to give me any history and very hard of hearing. Nurse reports 2 episodes of vaginal bleeding today without BMs or rectal bleeding noted. Patient on chronic Coumadin for Afib and INR 3.94 on admit. Family denies previous colonoscopy. No history of hematemesis, melena, N/V. Complains of abdominal pain.    Past Medical History  Diagnosis Date  . CHF (congestive heart failure)     EF 30-35% on 2D Echo in July 2012  . High cholesterol   . Atrial fibrillation     rate controlled, on chronic coumadin therapy  . Sick sinus syndrome     s/p pacemaker  . Chronic kidney disease (CKD), stage III (moderate)   . Anemia     baseline 7.8-9.0  . Coronary artery disease     stent in 2008 shows CABG to LAD, RCA and circumflex with 100% occlusion  . Heart murmur   . Mitral regurgitation     severe MR,EF 30-35%  . Ischemic cardiomyopathy   . Aortic stenosis     severe  . Renal insufficiency, mild     Past Surgical History  Procedure Laterality Date  . Exploratory laparotomy w/ bowel resection  2/12    in setting of SBO  . Pacemaker insertion  03/30/2009    [employer]  [facility]  . R breast lumpectomy  05/2004  . Ureterolithotomy  01/2002  . Insert / replace / remove pacemaker    . Coronary artery bypass graft  1980's    Prior to Admission medications   Medication Sig Start Date End Date Taking? Authorizing Provider  donepezil (ARICEPT) 5 MG tablet Take 5 mg by mouth at bedtime.    Historical Provider, MD  ezetimibe (ZETIA) 10 MG tablet Take 10 mg by mouth daily.      Historical Provider, MD  furosemide (LASIX) 80 MG tablet Take one tablet daily. 05/07/13    Mihai Croitoru, MD  hydrALAZINE (APRESOLINE) 25 MG tablet Take 1 tablet (25 mg total) by mouth every 12 (twelve) hours. 04/16/13   Wilburt Finlay, PA-C  isosorbide dinitrate (ISORDIL) 10 MG tablet Take 1 tablet (10 mg total) by mouth 2 (two) times daily. 04/16/13   Wilburt Finlay, PA-C  LORazepam (ATIVAN) 0.5 MG tablet Take 1 tablet (0.5 mg total) by mouth every 8 (eight) hours. 05/07/13   Mihai Croitoru, MD  nitroGLYCERIN (NITROSTAT) 0.4 MG SL tablet Place 1 tablet (0.4 mg total) under the tongue every 5 (five) minutes as needed for chest pain. 09/08/12   Richardean Canal, MD  pantoprazole (PROTONIX) 40 MG tablet Take 40 mg by mouth daily.     Historical Provider, MD  potassium chloride (KLOR-CON M10) 10 MEQ tablet Take 1 tablet (10 mEq total) by mouth daily. 04/23/13   Mihai Croitoru, MD    Scheduled Meds: . donepezil  5 mg Oral QHS  . ezetimibe  10 mg Oral Daily  . furosemide      . hydrALAZINE  25 mg Oral Q12H  . [START ON 05/10/2013] influenza vac split quadrivalent PF  0.5 mL Intramuscular Tomorrow-1000  . isosorbide dinitrate  10 mg Oral BID  . LORazepam  0.5 mg Oral Q8H  . pantoprazole  40  mg Oral Daily  . sodium chloride  3 mL Intravenous Q12H   Continuous Infusions:  PRN Meds:.  Allergies as of 05/08/2013 - Review Complete 05/08/2013  Allergen Reaction Noted  . Penicillins Swelling   . Simvastatin Other (See Comments) 10/30/2012  . Ace inhibitors Cough 07/02/2011  . Amlodipine besylate Rash 10/30/2012  . Aspirin Nausea Only, Rash, and Other (See Comments)   . Food Other (See Comments) 07/02/2011  . Nutritional supplements Other (See Comments) 07/02/2011    No family history on file.  History   Social History  . Marital Status: Single    Spouse Name: N/A    Number of Children: N/A  . Years of Education: N/A   Occupational History  . Not on file.   Social History Main Topics  . Smoking status: Never Smoker   . Smokeless tobacco: Never Used  . Alcohol Use: No  . Drug Use:  No  . Sexual Activity: No   Other Topics Concern  . Not on file   Social History Narrative   Patient drives and volunteers at [facility] day[facility] at baseline    Review of Systems: Unable to obtain from patient  Physical Exam: Vital signs: Filed Vitals:   05/09/13 1307  BP: 124/64  Pulse: 60  Temp: 98.3 F (36.8 C)  Resp: 18   Last BM Date: 05/08/13 General:   Elderly, frail, hard of hearing, thin, no acute distress Lungs:  Clear throughout to auscultation.   No wheezes, crackles, or rhonchi. No acute distress. Heart:  Regular rate and rhythm; no murmurs, clicks, rubs,  or gallops. Abdomen: diffuse tenderness with guarding, soft, nondistended, +BS  Rectal:  Deferred  GI:  Lab Results:  Recent Labs  05/08/13 1353 05/09/13 0624  WBC 4.4 3.4*  HGB 8.8* 7.7*  HCT 26.1* 22.8*  PLT 236 194   BMET  Recent Labs  05/08/13 1353 05/09/13 0624  NA 142 142  K 4.4 4.1  CL 106 107  CO2 19 21  GLUCOSE 91 84  BUN 45* 45*  CREATININE 1.77* 1.89*  CALCIUM 9.1 8.8   LFT  Recent Labs  05/08/13 1954  PROT 6.6  ALBUMIN 3.7  AST 31  ALT 13  ALKPHOS 183*  BILITOT 2.1*  BILIDIR 0.8*  IBILI 1.3*   PT/INR  Recent Labs  05/07/13 05/08/13 1353  LABPROT  --  37.0*  INR 2.8 3.94*     Studies/Results: Ct Abdomen Pelvis Wo Contrast  05/08/2013   CLINICAL DATA:  Diffuse abdominal pain, rectal bleeding.  EXAM: CT ABDOMEN AND PELVIS WITHOUT CONTRAST  TECHNIQUE: Multidetector CT imaging of the abdomen and pelvis was performed following the standard protocol without intravenous contrast.  COMPARISON:  The CT to 17 2012  FINDINGS: There are bilateral small pleural effusions. No focal consolidation at the lung bases. Heart is enlarged. No pericardial fluid.  There is interval development of a moderate to large volume of intraperitoneal free fluid throughout the abdomen and pelvis. This fluid is low attenuation suggesting ascites. This makes evaluation of the  abdominal and pelvic contents difficult on this non contrast exam.  Oral contrast does pass through the entirety of the bowel to the rectum. No mass lesion or obstruction identified. Liver is grossly normal. The pancreas, spleen, adrenal glands and kidneys are grossly normal.  There is a uterus is normal. The bladder is distended but appears normal.  The abdominal aorta and renal arteries are heavily calcified. No aneurysm. Posterior lumbar fusion noted.  IMPRESSION: 1. Large volume of intraperitoneal free fluid consists with ascites.  2. No evidence of bowel obstruction.  3.  Small bilateral pleural effusions.   Electronically Signed   By: Genevive Bi M.D.   On: 05/08/2013 18:20   Dg Abd Acute W/chest  05/08/2013   CLINICAL DATA:  Rectal bleeding.  EXAM: ACUTE ABDOMEN SERIES (ABDOMEN 2 VIEW & CHEST 1 VIEW)  COMPARISON:  Chest x-ray 04/14/2013.  FINDINGS: The upright chest x-ray demonstrates stable cardiac enlargement and moderate vascular congestion. The pacer wire is stable. Small pleural effusions are suspected.  Two views of the abdomen demonstrate air-filled small bowel loops in with scattered air-fluid levels in but no significant distention. This could reflect an early small bowel obstruction. Very little colonic air. Overall increased density and slight centralization of the bowel loops could be due to ascites. No free air. The soft tissue shadows are grossly maintained. The bony structures are intact. Lumbar fusion hardware is noted. Extensive vascular calcifications are present.  IMPRESSION: Cardiac enlargement and mild vascular congestion.  Possible early small bowel obstruction and possible ascites.   Electronically Signed   By: Loralie Champagne M.D.   On: 05/08/2013 14:43    Impression/Plan: 79 yo with rectal bleeding in the setting of a supratherapeutic INR, who now seems to be having vaginal bleeding per nursing staff. Rectal bleeding likely diverticular in origin vs mucosal bleeding from  elevated INR. Malignancy also possible. I do not think this is due to an upper GI source. Agree with correcting INR. Would NOT recommend any invasive GI procedures due to her multiple comorbidities and advanced age. Continue supportive care. Start clear liquids. Will follow.    LOS: 1 day   Lourdez Mcgahan C.  05/09/2013, 1:58 PM

## 2013-05-09 NOTE — Progress Notes (Addendum)
Pt showing some A. Flutter on initial tele strip. Pt in no apparent distress and asymptomatic.  Paged MD on call. Awaiting further orders. Will continue to monitor pt.  MD responded. RN to continue to monitor pt and her heart rate

## 2013-05-10 DIAGNOSIS — I4891 Unspecified atrial fibrillation: Secondary | ICD-10-CM

## 2013-05-10 LAB — CBC
MCH: 31.2 pg (ref 26.0–34.0)
MCHC: 33.6 g/dL (ref 30.0–36.0)
MCV: 92.8 fL (ref 78.0–100.0)
Platelets: 178 10*3/uL (ref 150–400)
RBC: 2.76 MIL/uL — ABNORMAL LOW (ref 3.87–5.11)

## 2013-05-10 LAB — TYPE AND SCREEN
Antibody Screen: NEGATIVE
Unit division: 0

## 2013-05-10 LAB — PROTIME-INR: Prothrombin Time: 22.1 seconds — ABNORMAL HIGH (ref 11.6–15.2)

## 2013-05-10 NOTE — Progress Notes (Signed)
Patient ID: [name], female   DOB: 01/29/1918, 79 y.o.   MRN: [id number]  Patient sleeping and no family in room. Small amount of bloody loose stool documented in chart this morning. INR 2.0.  Continue conservative care. Advance diet as tolerated. Will sign off. Call with questions.

## 2013-05-10 NOTE — Progress Notes (Signed)
[facility] PROGRESS NOTE  [name] WNU:[id number] DOB: 03/13/1918 DOA: 05/08/2013 PCP: No PCP Per Patient  HPI/Subjective: Very hard of hearing. No significant complain looks very sleepy.  Assessment/Plan: Principal Problem:   BRBPR (bright red blood per rectum) Active Problems:   Chronic anticoagulation - on Warfarin   Aortic stenosis- moderate to moderate/ severe 04/11/13   Anemia   BRBPR -Rectal bleeding likely secondary to hemorrhoids versus diverticular bleed.  -Malignancy also in the differential diagnoses list. -Patient has supratherapeutic INR, of 3.9, INR is 2.0 this morning. -GI consulted, recommended watching, because of advanced age and comorbidities no invasive GI testing.  Anemia -Acute blood loss anemia on background of chronic anemia. -Hemoglobin baseline is 8.8, hemoglobin dropped to 7.7. -Status post transfusion of one unit of packed RBCs.  Supratherapeutic INR -Patient received vitamin K yesterday, INR in the a.m. is 2.0.  Aortic stenosis. -Aortic valve stenosis and mitral valve regurgitation.  Severe pulmonary hypertension -Continue home medications, does not appear short of breath.  Code Status: Full code Family Communication: Plan discussed with the patient. Disposition Plan: Remains inpatient   Consultants:  Dr. Bosie Clos of [facility] GI  Procedures:  None  Antibiotics:  None   Objective: Filed Vitals:   05/10/13 1025  BP: 148/70  Pulse:   Temp:   Resp:     Intake/Output Summary (Last 24 hours) at 05/10/13 1101 Last data filed at 05/10/13 0951  Gross per 24 hour  Intake  762.5 ml  Output      0 ml  Net  762.5 ml   Filed Weights   05/09/13 1204  Weight: 61.236 kg (135 lb)    Exam: General: Sleeping this morning, very hard of hearing. HEENT: anicteric sclera, pupils reactive to light and accommodation, EOMI CVS: S1-S2 clear, no murmur rubs or gallops Chest: clear to auscultation bilaterally, no wheezing,  rales or rhonchi Abdomen: soft nontender, nondistended, normal bowel sounds, no organomegaly Extremities: no cyanosis, clubbing or edema noted bilaterally Neuro: Cranial nerves II-XII intact, no focal neurological deficits  Data Reviewed: Basic Metabolic Panel:  Recent Labs Lab 05/08/13 1353 05/09/13 0624  NA 142 142  K 4.4 4.1  CL 106 107  CO2 19 21  GLUCOSE 91 84  BUN 45* 45*  CREATININE 1.77* 1.89*  CALCIUM 9.1 8.8   Liver Function Tests:  Recent Labs Lab 05/08/13 1954  AST 31  ALT 13  ALKPHOS 183*  BILITOT 2.1*  PROT 6.6  ALBUMIN 3.7   No results found for this basename: LIPASE, AMYLASE,  in the last 168 hours No results found for this basename: AMMONIA,  in the last 168 hours CBC:  Recent Labs Lab 05/08/13 1353 05/09/13 0624 05/09/13 1716 05/10/13 0451  WBC 4.4 3.4* 4.6 4.7  NEUTROABS 3.0  --   --   --   HGB 8.8* 7.7* 8.4* 8.6*  HCT 26.1* 22.8* 24.7* 25.6*  MCV 93.5 92.3 91.5 92.8  PLT 236 194 185 178   Cardiac Enzymes: No results found for this basename: CKTOTAL, CKMB, CKMBINDEX, TROPONINI,  in the last 168 hours BNP (last 3 results)  Recent Labs  04/12/13 0500 04/14/13 0605 04/29/13 2007  PROBNP 11247.0* 12470.0* 9884.0*   CBG: No results found for this basename: GLUCAP,  in the last 168 hours  Micro No results found for this or any previous visit (from the past 240 hour(s)).   Studies: Ct Abdomen Pelvis Wo Contrast  05/08/2013   CLINICAL DATA:  Diffuse abdominal pain, rectal bleeding.  EXAM: CT ABDOMEN AND PELVIS WITHOUT CONTRAST  TECHNIQUE: Multidetector CT imaging of the abdomen and pelvis was performed following the standard protocol without intravenous contrast.  COMPARISON:  The CT to 17 2012  FINDINGS: There are bilateral small pleural effusions. No focal consolidation at the lung bases. Heart is enlarged. No pericardial fluid.  There is interval development of a moderate to large volume of intraperitoneal free fluid throughout the  abdomen and pelvis. This fluid is low attenuation suggesting ascites. This makes evaluation of the abdominal and pelvic contents difficult on this non contrast exam.  Oral contrast does pass through the entirety of the bowel to the rectum. No mass lesion or obstruction identified. Liver is grossly normal. The pancreas, spleen, adrenal glands and kidneys are grossly normal.  There is a uterus is normal. The bladder is distended but appears normal.  The abdominal aorta and renal arteries are heavily calcified. No aneurysm. Posterior lumbar fusion noted.  IMPRESSION: 1. Large volume of intraperitoneal free fluid consists with ascites.  2. No evidence of bowel obstruction.  3.  Small bilateral pleural effusions.   Electronically Signed   By: Genevive Bi M.D.   On: 05/08/2013 18:20   Dg Abd Acute W/chest  05/08/2013   CLINICAL DATA:  Rectal bleeding.  EXAM: ACUTE ABDOMEN SERIES (ABDOMEN 2 VIEW & CHEST 1 VIEW)  COMPARISON:  Chest x-ray 04/14/2013.  FINDINGS: The upright chest x-ray demonstrates stable cardiac enlargement and moderate vascular congestion. The pacer wire is stable. Small pleural effusions are suspected.  Two views of the abdomen demonstrate air-filled small bowel loops in with scattered air-fluid levels in but no significant distention. This could reflect an early small bowel obstruction. Very little colonic air. Overall increased density and slight centralization of the bowel loops could be due to ascites. No free air. The soft tissue shadows are grossly maintained. The bony structures are intact. Lumbar fusion hardware is noted. Extensive vascular calcifications are present.  IMPRESSION: Cardiac enlargement and mild vascular congestion.  Possible early small bowel obstruction and possible ascites.   Electronically Signed   By: Loralie Champagne M.D.   On: 05/08/2013 14:43    Scheduled Meds: . donepezil  5 mg Oral QHS  . ezetimibe  10 mg Oral Daily  . hydrALAZINE  25 mg Oral Q12H  .  isosorbide dinitrate  10 mg Oral BID  . LORazepam  0.5 mg Oral Q8H  . pantoprazole  40 mg Oral Daily  . sodium chloride  3 mL Intravenous Q12H   Continuous Infusions:      Time spent: 35 minutes    [facility] A  [facility] Pager [phone] If 7PM-7AM, please contact night-coverage at [email or web address], password [facility] 05/10/2013, 11:01 AM  LOS: 2 days

## 2013-05-11 ENCOUNTER — Inpatient Hospital Stay (HOSPITAL_COMMUNITY): Payer: Medicare Other

## 2013-05-11 LAB — CBC
Hemoglobin: 8.5 g/dL — ABNORMAL LOW (ref 12.0–15.0)
Hemoglobin: 9.5 g/dL — ABNORMAL LOW (ref 12.0–15.0)
MCH: 31.5 pg (ref 26.0–34.0)
MCH: 31.1 pg (ref 26.0–34.0)
MCHC: 33.9 g/dL (ref 30.0–36.0)
MCV: 93 fL (ref 78.0–100.0)
MCV: 94.1 fL (ref 78.0–100.0)
Platelets: 179 10*3/uL (ref 150–400)
Platelets: 189 10*3/uL (ref 150–400)
RBC: 2.7 MIL/uL — ABNORMAL LOW (ref 3.87–5.11)
RDW: 18.1 % — ABNORMAL HIGH (ref 11.5–15.5)
RDW: 17.8 % — ABNORMAL HIGH (ref 11.5–15.5)

## 2013-05-11 LAB — BASIC METABOLIC PANEL
CO2: 21 mEq/L (ref 19–32)
Calcium: 9 mg/dL (ref 8.4–10.5)
Creatinine, Ser: 1.79 mg/dL — ABNORMAL HIGH (ref 0.50–1.10)
GFR calc Af Amer: 27 mL/min — ABNORMAL LOW (ref >90)
GFR calc non Af Amer: 23 mL/min — ABNORMAL LOW (ref >90)
Glucose, Bld: 85 mg/dL (ref 70–99)
Potassium: 4 mEq/L (ref 3.5–5.1)
Sodium: 140 mEq/L (ref 135–145)

## 2013-05-11 MED ORDER — WARFARIN SODIUM 2.5 MG PO TABS
2.5000 mg | ORAL_TABLET | Freq: Once | ORAL | Status: AC
  Administered 2013-05-11: 18:00:00 2.5 mg via ORAL
  Filled 2013-05-11: qty 1

## 2013-05-11 MED ORDER — FUROSEMIDE 10 MG/ML IJ SOLN
40.0000 mg | Freq: Once | INTRAMUSCULAR | Status: DC
  Filled 2013-05-11: qty 4

## 2013-05-11 MED ORDER — FUROSEMIDE 80 MG PO TABS
80.0000 mg | ORAL_TABLET | Freq: Every day | ORAL | Status: DC
  Administered 2013-05-11 – 2013-05-13 (×3): 80 mg via ORAL
  Filled 2013-05-11 (×4): qty 1

## 2013-05-11 MED ORDER — WARFARIN - PHARMACIST DOSING INPATIENT: Freq: Every day | Status: DC

## 2013-05-11 NOTE — Progress Notes (Signed)
Patient recently became active with [facility] Care Management. Noted discharge plan may be SNF if patient is agreeable. [facility] Community Care Coordinator reports patient has also been active with [facility] for PT, OTC, RN services. [facility] Care Management will not replace or interfere with home health services. Made inpatient RNCM aware of the above. Will continue to follow.  Raiford Noble, MSN-Ed, RN,BSN- [facility] [phone]

## 2013-05-11 NOTE — Evaluation (Signed)
Occupational Therapy Evaluation Patient Details Name: [name] MRN: [id number] DOB: [birth year] Today's Date: 05/11/2013 Time: 4098-1191 OT Time Calculation (min): 25 min  OT Assessment / Plan / Recommendation History of present illness  52 YOAAF, woke up feeling worthless, no energy, no chest pain, maybe a little shortness of breath.  She drove to see her PCP and in his office SPO2 was 77% on room air.  She was then was transported by EMS to [facility] for further eval. Here Pro BNP is 9265, K+ low, H/H 9.4/28.8; INR 2.22    Clinical Impression   Pt admitted with the above diagnosis and would benefit from cont OT to increase I with basic adls and try to reach baseline adls status of Mod I with all adls.      OT Assessment  Patient needs continued OT Services    Follow Up Recommendations  SNF;Supervision/Assistance - 24 hour    Barriers to Discharge Decreased caregiver support Pt lives alone w no assist.  May need ST SNF  Equipment Recommendations  None recommended by OT    Recommendations for Other Services    Frequency  Min 2X/week    Precautions / Restrictions Precautions Precautions: Fall Restrictions Weight Bearing Restrictions: No   Pertinent Vitals/Pain Pt reports no pain.  O2 sat are 93% w.o O2.    ADL  Eating/Feeding: Performed;Independent Where Assessed - Eating/Feeding: Chair Grooming: Performed;Wash/dry face;Teeth care;Set up Where Assessed - Grooming: Supported sitting Upper Body Bathing: Simulated;Set up Where Assessed - Upper Body Bathing: Unsupported sitting Lower Body Bathing: Simulated;Minimal assistance Where Assessed - Lower Body Bathing: Supported sit to stand Upper Body Dressing: Simulated;Set up Where Assessed - Upper Body Dressing: Unsupported sitting Lower Body Dressing: Performed;Minimal assistance Where Assessed - Lower Body Dressing: Supported sit to stand Toilet Transfer: Performed;Minimal assistance Toilet Transfer Method: Scientist, product/process development: Materials engineer and Hygiene: Performed;Minimal assistance Where Assessed - Engineer, mining and Hygiene: Standing Transfers/Ambulation Related to ADLs: Pt took a few steps to [facility] and to chair with HHA and min assist. ADL Comments: Pt able to cross legs and reach feet to do adls. Pt's major deficit is activity tolerance and balance when doing adls on her feet.    OT Diagnosis: Generalized weakness  OT Problem List: Decreased strength;Decreased activity tolerance;Cardiopulmonary status limiting activity OT Treatment Interventions: Self-care/ADL training;Therapeutic activities   OT Goals(Current goals can be found in the care plan section) Acute Rehab OT Goals Patient Stated Goal: to go home OT Goal Formulation: With patient/family Time For Goal Achievement: 05/25/13 Potential to Achieve Goals: Good ADL Goals Pt Will Perform Grooming: with supervision;standing Pt Will Perform Lower Body Bathing: with supervision;sit to/from stand Pt Will Perform Lower Body Dressing: with supervision;sit to/from stand Pt Will Perform Tub/Shower Transfer: with supervision;rolling walker;shower seat;ambulating Additional ADL Goal #1: Pt will transfer to toilet and do all toileting with S.  Visit Information  Last OT Received On: 05/11/13 Assistance Needed: +1 History of Present Illness:  89 YOAAF, woke up feeling worthless, no energy, no chest pain, maybe a little shortness of breath.  She drove to see her PCP and in his office SPO2 was 77% on room air.  She was then was transported by EMS to [facility] for further eval. Here Pro BNP is 9265, K+ low, H/H 9.4/28.8; INR 2.22        Prior Functioning     Home Living Family/patient expects to be discharged to:: Private residence Living Arrangements: Alone Available  Help at Discharge: Family Type of Home: House Home Access: Level entry Home Layout: One level Home Equipment: Cane  - single point;Shower seat Prior Function Level of Independence: Independent with assistive device(s) Comments: uses a cane.  Was just hospitalized and used a walker for a bit after d/c last admission Communication Communication: HOH Dominant Hand: Right         Vision/Perception Vision - History Baseline Vision: Wears glasses all the time Patient Visual Report: No change from baseline Vision - Assessment Vision Assessment: Vision not tested   Cognition  Cognition Arousal/Alertness: Awake/alert Behavior During Therapy: WFL for tasks assessed/performed Overall Cognitive Status: Within Functional Limits for tasks assessed    Extremity/Trunk Assessment Upper Extremity Assessment Upper Extremity Assessment: Overall WFL for tasks assessed Lower Extremity Assessment Lower Extremity Assessment: Defer to PT evaluation Cervical / Trunk Assessment Cervical / Trunk Assessment: Normal     Mobility Bed Mobility Bed Mobility: Supine to Sit;Sitting - Scoot to Edge of Bed Supine to Sit: 5: Supervision;HOB elevated (HOB at 40 degrees) Sitting - Scoot to Edge of Bed: 5: Supervision Details for Bed Mobility Assistance: No assist needed. Transfers Transfers: Sit to Stand;Stand to Sit Sit to Stand: 4: Min assist;With upper extremity assist;From bed Stand to Sit: 4: Min assist;To chair/3-in-1 Details for Transfer Assistance: Cues for hand placement and controlled sit.     Exercise     Balance Balance Balance Assessed: Yes Static Standing Balance Static Standing - Balance Support: Bilateral upper extremity supported Static Standing - Level of Assistance: 4: Min assist Static Standing - Comment/# of Minutes: 2   End of Session OT - End of Session Activity Tolerance: Patient limited by fatigue Patient left: in chair;with call bell/phone within reach;with chair alarm set;with family/visitor present Nurse Communication: Mobility status  GO     Hope Budds 05/11/2013, 9:35  AM [phone]

## 2013-05-11 NOTE — Clinical Social Work Psychosocial (Signed)
Clinical Social Work Department BRIEF PSYCHOSOCIAL ASSESSMENT 05/11/2013  Patient:  [name], [name]     Account Number:  [id number]     Admit date:  05/08/2013  Clinical Social Worker:  Lavell Luster  Date/Time:  05/11/2013 04:32 PM  Referred by:  Physician  Date Referred:  05/11/2013 Referred for  SNF Placement   Other Referral:   Interview type:  Family Other interview type:   Patient's neice and patient were both interviewed for assessment.    PSYCHOSOCIAL DATA Living Status:  ALONE Admitted from facility:   Level of care:   Primary support name:  [name] [phone] Primary support relationship to patient:  FAMILY Degree of support available:   Support is limited.    CURRENT CONCERNS Current Concerns  Post-Acute Placement   Other Concerns:    SOCIAL WORK ASSESSMENT / PLAN CSW spoke with patient's neice over phone who states that she believes patient will  benefit from a SNF stay at this time. Neice is agreeable to SNF search and placement. Patient is also agreeable to SNF search and placement, but CSW unsure if patient truly understands the SNF search process. Patient just seems to reply "Okay" to everything. Patient is from home alone, but does have a nurse that comes by in the morning, and is being followed by [facility]. Neice states that [facility] would be the preferred SNF.   Assessment/plan status:  Psychosocial Support/Ongoing Assessment of Needs Other assessment/ plan:   Complete FL2, Fax out, PASRR   Information/referral to community resources:   SNF list and CSW contact information left in patient's room.    PATIENT'S/FAMILY'S RESPONSE TO PLAN OF CARE: Patient and neice agreeable to SNF placement and search. Patient and neice are both pleasant and appreciaitive of CSW contact. Patient and neice await bed offers.    Roddie Mc, [place], [place], [phone]

## 2013-05-11 NOTE — Progress Notes (Signed)
[facility] PROGRESS NOTE  [name] VWU:[id number] DOB: 07/15/1918 DOA: 05/08/2013 PCP: No PCP Per Patient  HPI/Subjective: Hard of hearing, granddaughter at bedside. Her granddaughter concerned about wheezing after she eats.  Assessment/Plan: Principal Problem:   BRBPR (bright red blood per rectum) Active Problems:   Chronic anticoagulation - on Warfarin   Aortic stenosis- moderate to moderate/ severe 04/11/13   Anemia   BRBPR -Rectal bleeding likely secondary to hemorrhoids versus diverticular bleed.  -Malignancy also in the differential diagnoses list. -Patient has supratherapeutic INR, of 3.9, INR is 2.0 this morning. -GI consulted, recommended watching, because of advanced age and comorbidities no invasive GI testing.  Anemia -Acute blood loss anemia on background of chronic anemia. -Hemoglobin baseline is 8.8, hemoglobin dropped to 7.7. -Status post transfusion of one unit of packed RBCs.  Supratherapeutic INR -Patient received vitamin K yesterday, INR in the a.m. is 2.0. -Coumadin restarted.  Aortic stenosis. -Aortic valve stenosis and mitral valve regurgitation.  Severe pulmonary hypertension -Continue home medications, started to show some shortness of breath as patient is being ambulated today by PT. -PT recommended SNF, social worker to follow.  Code Status: Full code Family Communication: Plan discussed with the patient. Disposition Plan: Remains inpatient   Consultants:  Dr. Bosie Clos of [facility] GI  Procedures:  None  Antibiotics:  None   Objective: Filed Vitals:   05/11/13 0549  BP:   Pulse: 60  Temp:   Resp:     Intake/Output Summary (Last 24 hours) at 05/11/13 1040 Last data filed at 05/11/13 0935  Gross per 24 hour  Intake   3980 ml  Output      0 ml  Net   3980 ml   Filed Weights   05/09/13 1204  Weight: 61.236 kg (135 lb)    Exam: General: Sleeping this morning, very hard of hearing. HEENT: anicteric sclera,  pupils reactive to light and accommodation, EOMI CVS: S1-S2 clear, no murmur rubs or gallops Chest: clear to auscultation bilaterally, no wheezing, rales or rhonchi Abdomen: soft nontender, nondistended, normal bowel sounds, no organomegaly Extremities: no cyanosis, clubbing or edema noted bilaterally Neuro: Cranial nerves II-XII intact, no focal neurological deficits  Data Reviewed: Basic Metabolic Panel:  Recent Labs Lab 05/08/13 1353 05/09/13 0624 05/11/13 0425  NA 142 142 140  K 4.4 4.1 4.0  CL 106 107 107  CO2 19 21 21   GLUCOSE 91 84 85  BUN 45* 45* 39*  CREATININE 1.77* 1.89* 1.79*  CALCIUM 9.1 8.8 9.0   Liver Function Tests:  Recent Labs Lab 05/08/13 1954  AST 31  ALT 13  ALKPHOS 183*  BILITOT 2.1*  PROT 6.6  ALBUMIN 3.7   No results found for this basename: LIPASE, AMYLASE,  in the last 168 hours No results found for this basename: AMMONIA,  in the last 168 hours CBC:  Recent Labs Lab 05/08/13 1353 05/09/13 0624 05/09/13 1716 05/10/13 0451 05/11/13 0425  WBC 4.4 3.4* 4.6 4.7 4.7  NEUTROABS 3.0  --   --   --   --   HGB 8.8* 7.7* 8.4* 8.6* 8.5*  HCT 26.1* 22.8* 24.7* 25.6* 25.1*  MCV 93.5 92.3 91.5 92.8 93.0  PLT 236 194 185 178 179   Cardiac Enzymes: No results found for this basename: CKTOTAL, CKMB, CKMBINDEX, TROPONINI,  in the last 168 hours BNP (last 3 results)  Recent Labs  04/12/13 0500 04/14/13 0605 04/29/13 2007  PROBNP 11247.0* 12470.0* 9884.0*   CBG: No results found for this basename:  GLUCAP,  in the last 168 hours  Micro No results found for this or any previous visit (from the past 240 hour(s)).   Studies: No results found.  Scheduled Meds: . donepezil  5 mg Oral QHS  . ezetimibe  10 mg Oral Daily  . furosemide  40 mg Intravenous Once  . hydrALAZINE  25 mg Oral Q12H  . isosorbide dinitrate  10 mg Oral BID  . LORazepam  0.5 mg Oral Q8H  . pantoprazole  40 mg Oral Daily  . sodium chloride  3 mL Intravenous Q12H  .  warfarin  2.5 mg Oral ONCE-1800  . Warfarin - Pharmacist Dosing Inpatient   Does not apply q1800   Continuous Infusions:      Time spent: 35 minutes    [facility] A  [facility] Pager [phone] If 7PM-7AM, please contact night-coverage at [email or web address], password [email or web address] 05/11/2013, 10:40 AM  LOS: 3 days

## 2013-05-11 NOTE — Evaluation (Addendum)
Physical Therapy Evaluation Patient Details Name: [name] MRN: [id number] DOB: 04/12/1918 Today's Date: 05/11/2013 Time: 7829-5621 PT Time Calculation (min): 15 min  PT Assessment / Plan / Recommendation History of Present Illness   89 YOAAF, woke up feeling worthless, no energy, no chest pain, maybe a little shortness of breath.  She drove to see her PCP and in his office SPO2 was 77% on room air.  She was then was transported by EMS to [facility] for further eval. Here Pro BNP is 9265, K+ low, H/H 9.4/28.8; INR 2.22 Pt also with BRBPR.  Clinical Impression  Pt admitted with above. Pt currently with functional limitations due to the deficits listed below (see PT Problem List).  Pt will benefit from skilled PT to increase their independence and safety with mobility to allow discharge to the venue listed below.  Pt's granddaughter reports pt begins having SOB and wheezing when she eats.       PT Assessment  Patient needs continued PT services    Follow Up Recommendations  SNF    Does the patient have the potential to tolerate intense rehabilitation      Barriers to Discharge        Equipment Recommendations  None recommended by PT    Recommendations for Other Services   ST swallowing eval.  Frequency Min 3X/week    Precautions / Restrictions Precautions Precautions: Fall Restrictions Weight Bearing Restrictions: No   Pertinent Vitals/Pain See flow sheet.  SaO2 83% on RA with amb. Placed pt on 2L O2.      Mobility  Transfers Sit to Stand: 4: Min assist;With upper extremity assist;With armrests;From chair/3-in-1 Stand to Sit: 4: Min assist;With upper extremity assist;With armrests;To chair/3-in-1 Details for Transfer Assistance: Cues for hand placement and controlled sit. Ambulation/Gait Ambulation/Gait Assistance: 4: Min assist Ambulation Distance (Feet): 100 Feet Assistive device: Rolling walker Ambulation/Gait Assistance Details: Tactile cues initially to stay  close to walker. Gait Pattern: Step-through pattern;Decreased stride length Gait velocity: slow    Exercises     PT Diagnosis: Difficulty walking;Generalized weakness  PT Problem List: Decreased strength;Decreased activity tolerance;Decreased balance;Decreased mobility;Decreased knowledge of use of DME;Cardiopulmonary status limiting activity PT Treatment Interventions: DME instruction;Gait training;Functional mobility training;Therapeutic activities;Therapeutic exercise;Balance training;Patient/family education     PT Goals(Current goals can be found in the care plan section) Acute Rehab PT Goals Patient Stated Goal: to go home PT Goal Formulation: With patient/family Time For Goal Achievement: 05/18/13 Potential to Achieve Goals: Good  Visit Information  Last PT Received On: 05/11/13 Assistance Needed: +1 History of Present Illness:  89 YOAAF, woke up feeling worthless, no energy, no chest pain, maybe a little shortness of breath.  She drove to see her PCP and in his office SPO2 was 77% on room air.  She was then was transported by EMS to [facility] for further eval. Here Pro BNP is 9265, K+ low, H/H 9.4/28.8; INR 2.22        Prior Functioning  Home Living Family/patient expects to be discharged to:: Private residence Living Arrangements: Alone Available Help at Discharge: Family Type of Home: House Home Access: Level entry Home Layout: One level Home Equipment: Cane - single point;Shower seat;Walker - 2 wheels Prior Function Level of Independence: Independent with assistive device(s) Comments: uses a cane.  Was just hospitalized and used a walker for a bit after d/c last admission Communication Communication: HOH Dominant Hand: Right    Cognition  Cognition Arousal/Alertness: Awake/alert Behavior During Therapy: WFL for tasks assessed/performed Overall Cognitive  Status: Difficult to assess Difficult to assess due to: Hard of hearing/deaf    Extremity/Trunk Assessment  Upper Extremity Assessment Upper Extremity Assessment: Overall WFL for tasks assessed Lower Extremity Assessment Lower Extremity Assessment: Generalized weakness Cervical / Trunk Assessment Cervical / Trunk Assessment: Normal   Balance Balance Balance Assessed: Yes Static Standing Balance Static Standing - Balance Support: Bilateral upper extremity supported Static Standing - Level of Assistance: 4: Min assist Static Standing - Comment/# of Minutes: 2  End of Session PT - End of Session Equipment Utilized During Treatment: Gait belt Activity Tolerance: Patient limited by fatigue Patient left: in chair;with call bell/phone within reach;with chair alarm set;with family/visitor present Nurse Communication: Mobility status;Other (comment) (decr SaO2)  GP     Laini Urick 05/11/2013, 10:05 AM  Skip Mayer PT [phone]

## 2013-05-11 NOTE — Progress Notes (Signed)
ANTICOAGULATION CONSULT NOTE - Initial Consult  Pharmacy Consult for Warfarin Indication: atrial fibrillation  Allergies  Allergen Reactions  . Penicillins Swelling  . Simvastatin Other (See Comments)    Myalgia   . Ace Inhibitors Cough  . Amlodipine Besylate Rash  . Aspirin Nausea Only, Rash and Other (See Comments)    GI distress  . Food Other (See Comments)    Berries,tomato  . Nutritional Supplements Other (See Comments)    Unknown.    Patient Measurements: Height: 5\' 6"  (167.6 cm) Weight: 135 lb (61.236 kg) IBW/kg (Calculated) : 59.3 Heparin Dosing Weight: n/a  Vital Signs: Temp: 97.9 F (36.6 C) (10/20 0540) Temp src: Oral (10/20 0540) BP: 135/77 mmHg (10/20 0540) Pulse Rate: 60 (10/20 0549)  Labs:  Recent Labs  05/08/13 1353 05/09/13 0624 05/09/13 1500 05/09/13 1716 05/10/13 0451 05/11/13 0425  HGB 8.8* 7.7*  --  8.4* 8.6* 8.5*  HCT 26.1* 22.8*  --  24.7* 25.6* 25.1*  PLT 236 194  --  185 178 179  LABPROT 37.0*  --  25.1*  --  22.1* 22.7*  INR 3.94*  --  2.37*  --  2.00* 2.08*  CREATININE 1.77* 1.89*  --   --   --  1.79*    Estimated Creatinine Clearance: 18.4 ml/min (by C-G formula based on Cr of 1.79).   Medical History: Past Medical History  Diagnosis Date  . CHF (congestive heart failure)     EF 30-35% on 2D Echo in July 2012  . High cholesterol   . Atrial fibrillation     rate controlled, on chronic coumadin therapy  . Sick sinus syndrome     s/p pacemaker  . Chronic kidney disease (CKD), stage III (moderate)   . Anemia     baseline 7.8-9.0  . Coronary artery disease     stent in 2008 shows CABG to LAD, RCA and circumflex with 100% occlusion  . Heart murmur   . Mitral regurgitation     severe MR,EF 30-35%  . Ischemic cardiomyopathy   . Aortic stenosis     severe  . Renal insufficiency, mild     Medications:  Scheduled:  . donepezil  5 mg Oral QHS  . ezetimibe  10 mg Oral Daily  . hydrALAZINE  25 mg Oral Q12H  . isosorbide  dinitrate  10 mg Oral BID  . LORazepam  0.5 mg Oral Q8H  . pantoprazole  40 mg Oral Daily  . sodium chloride  3 mL Intravenous Q12H    Assessment: 79 y.o. female on chronic coumadin for A.Fib who presents to the ED with c/o rectal bleeding. INR 3.94 at admission 10/17 (interestingly INR on 10/8 was 2.57, and on 10/16 was 2.8). Received 5 mg of vitamin K po on 10/17.  Pharmacy now asked to resume Coumadin.  Goal of Therapy:  INR 2-3 Monitor platelets by anticoagulation protocol: Yes   Plan:  1. Coumadin 2.5 mg po x1 tonight. 2. Daily PT/INR. 3. Monitor closely for any worsening of GI bleeding.  Tad Moore, BCPS  Clinical Pharmacist Pager [phone]  05/11/2013 10:31 AM

## 2013-05-11 NOTE — Evaluation (Signed)
Clinical/Bedside Swallow Evaluation Patient Details  Name: [name] MRN: [id number] Date of Birth: Feb 29, 1918  Today's Date: 05/11/2013 Time: 1530-1550 SLP Time Calculation (min): 20 min  Past Medical History:  Past Medical History  Diagnosis Date  . CHF (congestive heart failure)     EF 30-35% on 2D Echo in July 2012  . High cholesterol   . Atrial fibrillation     rate controlled, on chronic coumadin therapy  . Sick sinus syndrome     s/p pacemaker  . Chronic kidney disease (CKD), stage III (moderate)   . Anemia     baseline 7.8-9.0  . Coronary artery disease     stent in 2008 shows CABG to LAD, RCA and circumflex with 100% occlusion  . Heart murmur   . Mitral regurgitation     severe MR,EF 30-35%  . Ischemic cardiomyopathy   . Aortic stenosis     severe  . Renal insufficiency, mild    Past Surgical History:  Past Surgical History  Procedure Laterality Date  . Exploratory laparotomy w/ bowel resection  2/12    in setting of SBO  . Pacemaker insertion  03/30/2009    [employer]  . R breast lumpectomy  05/2004  . Ureterolithotomy  01/2002  . Insert / replace / remove pacemaker    . Coronary artery bypass graft  1980's   HPI:  79 year old female admitted with Rectal bleeding likely secondary to hemorrhoids versus diverticular bleed. Malignancy cannot be excluded. Per PT notes, Pt's granddaughter reports pt begins having SOB and wheezing when she eats. CXR results pending.    Assessment / Plan / Recommendation Clinical Impression  Patient presents with a functional appearing oropharyngeal swallow at bedside with trialed pos. Significant SOB and wheezing, which along with cough,  began with activity prior to initiation of pos are primary contributing factors to increased aspiration risk at this time. Given advanced age, SOB, and need for increased O2 levels beginning last evening, SLP will f/u briefly for diet tolerance and need for instrumental testing pending CXR  results. Education complete with patient regarding need for slowed rate of intake and frequent rest breaks to decrease aspiration risk.     Aspiration Risk  Moderate    Diet Recommendation Dysphagia 3 (Mechanical Soft);Thin liquid (for energy conservation)   Liquid Administration via: Cup;Straw Medication Administration: Whole meds with liquid Supervision: Patient able to self feed;Intermittent supervision to cue for compensatory strategies Compensations: Slow rate;Small sips/bites (take frequent breaks during meals) Postural Changes and/or Swallow Maneuvers: Seated upright 90 degrees    Other  Recommendations Oral Care Recommendations: Oral care BID   Follow Up Recommendations  None    Frequency and Duration min 2x/week  1 week   Pertinent Vitals/Pain none        Swallow Study    General HPI: 79 year old female admitted with Rectal bleeding likely secondary to hemorrhoids versus diverticular bleed. Malignancy cannot be excluded. Per PT notes, Pt's granddaughter reports pt begins having SOB and wheezing when she eats. CXR results pending.  Type of Study: Bedside swallow evaluation Previous Swallow Assessment: none Diet Prior to this Study: Regular;Thin liquids Temperature Spikes Noted: No Respiratory Status: Nasal cannula History of Recent Intubation: No Behavior/Cognition: Alert;Cooperative;Pleasant mood;Hard of hearing Oral Cavity - Dentition: Adequate natural dentition Self-Feeding Abilities: Able to feed self Patient Positioning: Upright in bed Baseline Vocal Quality: Clear (SOB with activity, wheezing with activity) Volitional Cough: Strong Volitional Swallow: Able to elicit  Oral/Motor/Sensory Function Overall Oral Motor/Sensory Function: Appears within functional limits for tasks assessed   Ice Chips Ice chips: Not tested   Thin Liquid Thin Liquid: Within functional limits Presentation: Cup;Self Fed;Straw    Nectar Thick Nectar Thick Liquid: Not tested    Honey Thick Honey Thick Liquid: Not tested   Puree Puree: Within functional limits Presentation: Spoon;Self Fed   Solid   GO   Katherine Herder MA, CCC-SLP [phone]  Solid: Within functional limits Presentation: Spoon;Self Fed       [name] 05/11/2013,3:49 PM

## 2013-05-12 DIAGNOSIS — R259 Unspecified abnormal involuntary movements: Secondary | ICD-10-CM

## 2013-05-12 DIAGNOSIS — I509 Heart failure, unspecified: Secondary | ICD-10-CM

## 2013-05-12 DIAGNOSIS — I5033 Acute on chronic diastolic (congestive) heart failure: Secondary | ICD-10-CM

## 2013-05-12 LAB — CBC
MCH: 31.7 pg (ref 26.0–34.0)
MCHC: 33.9 g/dL (ref 30.0–36.0)
MCV: 93.6 fL (ref 78.0–100.0)
Platelets: 174 10*3/uL (ref 150–400)
RDW: 17.6 % — ABNORMAL HIGH (ref 11.5–15.5)
WBC: 5.4 10*3/uL (ref 4.0–10.5)

## 2013-05-12 LAB — BASIC METABOLIC PANEL
CO2: 22 mEq/L (ref 19–32)
Calcium: 8.6 mg/dL (ref 8.4–10.5)
Creatinine, Ser: 1.81 mg/dL — ABNORMAL HIGH (ref 0.50–1.10)
GFR calc Af Amer: 27 mL/min — ABNORMAL LOW (ref >90)
GFR calc non Af Amer: 23 mL/min — ABNORMAL LOW (ref >90)

## 2013-05-12 LAB — PRO B NATRIURETIC PEPTIDE: Pro B Natriuretic peptide (BNP): 12524 pg/mL — ABNORMAL HIGH (ref 0–450)

## 2013-05-12 LAB — PREPARE RBC (CROSSMATCH)

## 2013-05-12 MED ORDER — HYDROCORTISONE ACE-PRAMOXINE 1-1 % RE FOAM
1.0000 | Freq: Two times a day (BID) | RECTAL | Status: DC
  Administered 2013-05-12 – 2013-05-15 (×7): 1 via RECTAL
  Filled 2013-05-12 (×3): qty 10

## 2013-05-12 MED ORDER — LORAZEPAM 0.5 MG PO TABS: 0.5000 mg | ORAL_TABLET | Freq: Three times a day (TID) | ORAL | Status: DC

## 2013-05-12 MED ORDER — FUROSEMIDE 10 MG/ML IJ SOLN
20.0000 mg | Freq: Once | INTRAMUSCULAR | Status: AC
  Administered 2013-05-12: 20 mg via INTRAVENOUS
  Filled 2013-05-12: qty 2

## 2013-05-12 MED ORDER — ASPIRIN 81 MG PO TABS: 81.0000 mg | ORAL_TABLET | Freq: Every day | ORAL | Status: DC

## 2013-05-12 NOTE — Progress Notes (Signed)
[facility] PROGRESS NOTE  [name] ZOX:[id number] DOB: 06/23/1918 DOA: 05/08/2013 PCP: No PCP Per Patient  HPI/Subjective: Very hard of hearing, denies pain.  Assessment/Plan: Principal Problem:   BRBPR (bright red blood per rectum) Active Problems:   Chronic anticoagulation - on Warfarin   Aortic stenosis- moderate to moderate/ severe 04/11/13   Anemia   BRBPR -recurrence last evening, multiple bloody BMs overnight. -Rectal bleeding likely secondary to hemorrhoids versus diverticular bleed.  -Malignancy also in the differential diagnoses list. -Coumadin has been discontinued.  Will likely need to discontinue it permanently - will discuss with family and [facility]. -GI consulted, recommended watching, because of advanced age and comorbidities no invasive GI testing.  Anemia -Acute blood loss anemia on background of chronic anemia. -Hemoglobin baseline is 8.8, hemoglobin dropped to 7.9 -Status post transfusion of one unit of packed RBCs on 10/18.  Will receive 2 more units on 10/21  Supratherapeutic INR -Patient received vitamin K yesterday, INR in the a.m. is 2.0. -Coumadin discontinued.  Consider Aspirin therapy once the patient stops bleeding.  Aortic stenosis. -Aortic valve stenosis and mitral valve regurgitation.  Severe pulmonary hypertension with mixed systolic and diastolic heart failure -BNP 12524 on 10/21 -Lasix 80 mg daily resumed -Isordil -Dry weight approximately 115 per Dr. Erin Hearing not of 05/07/13 office visit.   Code Status: Full code Family Communication: Plan discussed with the patient. Disposition Plan: Remains inpatient, SNF recommended by PT.   Consultants:  Dr. Bosie Clos of [facility]  Procedures:  None  Antibiotics:  None   Objective: Filed Vitals:   05/12/13 0536  BP: 134/81  Pulse: 58  Temp: 98.3 F (36.8 C)  Resp: 19    Intake/Output Summary (Last 24 hours) at 05/12/13 1247 Last data filed at 05/12/13 0343  Gross  per 24 hour  Intake      0 ml  Output    700 ml  Net   -700 ml   Filed Weights   05/09/13 1204  Weight: 61.236 kg (135 lb)    Exam: General: Sleeping this morning, very hard of hearing. significant Blood per rectum noted on the bed pad. HEENT: anicteric sclera, pupils reactive to light and accommodation, EOMI CVS: S1-S2 clear, no murmur rubs or gallops Chest: clear to auscultation bilaterally, no wheezing, rales or rhonchi Abdomen: soft nontender, nondistended, normal bowel sounds, no organomegaly Extremities: no cyanosis, clubbing or edema noted bilaterally   Data Reviewed: Basic Metabolic Panel:  Recent Labs Lab 05/08/13 1353 05/09/13 0624 05/11/13 0425 05/12/13 0605  NA 142 142 140 140  K 4.4 4.1 4.0 4.2  CL 106 107 107 107  CO2 19 21 21 22   GLUCOSE 91 84 85 95  BUN 45* 45* 39* 37*  CREATININE 1.77* 1.89* 1.79* 1.81*  CALCIUM 9.1 8.8 9.0 8.6   Liver Function Tests:  Recent Labs Lab 05/08/13 1954  AST 31  ALT 13  ALKPHOS 183*  BILITOT 2.1*  PROT 6.6  ALBUMIN 3.7   CBC:  Recent Labs Lab 05/08/13 1353  05/09/13 1716 05/10/13 0451 05/11/13 0425 05/11/13 1830 05/12/13 0605  WBC 4.4  < > 4.6 4.7 4.7 5.3 5.4  NEUTROABS 3.0  --   --   --   --   --   --   HGB 8.8*  < > 8.4* 8.6* 8.5* 9.5* 7.9*  HCT 26.1*  < > 24.7* 25.6* 25.1* 28.7* 23.3*  MCV 93.5  < > 91.5 92.8 93.0 94.1 93.6  PLT 236  < > 185  178 179 189 174  < > = values in this interval not displayed. BNP (last 3 results)  Recent Labs  04/14/13 0605 04/29/13 2007 05/12/13 0605  PROBNP 12470.0* 9884.0* 12524.0*    Studies: Dg Chest 2 View  05/11/2013   CLINICAL DATA:  Short of breath, cough  EXAM: CHEST  2 VIEW  COMPARISON:  Chest radiograph 04/14/2013  FINDINGS: Left-sided pacemaker with number loss stable enlarged cardiac silhouette. Small bilateral pleural effusions. There is central venous pulmonary congestion. Mild interstitial edema pattern. Low lung volumes. No pneumothorax.   IMPRESSION: Cardiomegaly, small effusions, and mild interstitial edema. No significant change from prior.   Electronically Signed   By: Genevive Bi M.D.   On: 05/11/2013 15:46    Scheduled Meds: . donepezil  5 mg Oral QHS  . ezetimibe  10 mg Oral Daily  . furosemide  20 mg Intravenous Once  . furosemide  80 mg Oral Daily  . hydrALAZINE  25 mg Oral Q12H  . isosorbide dinitrate  10 mg Oral BID  . LORazepam  0.5 mg Oral Q8H  . pantoprazole  40 mg Oral Daily  . sodium chloride  3 mL Intravenous Q12H   Continuous Infusions:     Conley Canal  [facility] Pager [phone]  If 7PM-7AM, please contact night-coverage at [email or web address], password [facility] 05/12/2013, 12:47 PM  LOS: 4 days

## 2013-05-12 NOTE — Progress Notes (Signed)
Addendum  Patient seen and examined, chart and data base reviewed.  I agree with the above assessment and plan.  For full details please see Mrs. Algis Downs PA note.  Discontinue Coumadin indefinitely, transfuse 2 units of packed RBCs and watch hemoglobin closely.   Clint Lipps, MD [facility] Pager: [phone] 05/12/2013, 2:20 PM

## 2013-05-12 NOTE — Progress Notes (Signed)
Speech Language Pathology Treatment:    Patient Details Name: [name] MRN: [id number] DOB: [birth year] Today's Date: 05/12/2013 Time: 0936-1000 SLP Time Calculation (min): 24 min  Assessment / Plan / Recommendation Clinical Impression  Pt. took several swallows of water via straw without overt s/s of aspiration, and no noted change in respirations.  CXR of 10/20 reveals Cardiomegaly, small effusions, and mild interstitial edema. No significant change from previous CXR.  Will defer MBS at this time, and d/c ST f/u.   HPI     Pertinent Vitals Pt. Remains afebrile; Lungs sounds diminished but clear; expiratory wheeze.  SLP Plan  Discharge SLP treatment due to (comment);All goals met    Recommendations Liquids provided via: Cup;Straw Medication Administration: Whole meds with liquid Supervision: Patient able to self feed;Intermittent supervision to cue for compensatory strategies Compensations: Slow rate;Small sips/bites Postural Changes and/or Swallow Maneuvers: Seated upright 90 degrees              Oral Care Recommendations: Oral care BID Plan: Discharge SLP treatment due to (comment);All goals met    GO     Maryjo Rochester T 05/12/2013, 10:01 AM

## 2013-05-12 NOTE — Care Management Note (Unsigned)
    Page 1 of 1   05/14/2013     4:07:27 PM   CARE MANAGEMENT NOTE 05/14/2013  Patient:  EDOM, SCHMUHL   Account Number:  [id number]  Date Initiated:  05/12/2013  Documentation initiated by:  Letha Cape  Subjective/Objective Assessment:   dx gib  admit- lives alone.     Action/Plan:   pt eval- rec snf.   Anticipated DC Date:  05/15/2013   Anticipated DC Plan:  SKILLED NURSING FACILITY  In-house referral  Clinical Social Worker      DC Planning Services  CM consult      Choice offered to / List presented to:             Status of service:  In process, will continue to follow Medicare Important Message given?   (If response is "NO", the following Medicare IM given date fields will be blank) Date Medicare IM given:   Date Additional Medicare IM given:    Discharge Disposition:    Per UR Regulation:  Reviewed for med. necessity/level of care/duration of stay  If discussed at Long Length of Stay Meetings, dates discussed:    Comments:  05/14/13 12:36 Letha Cape RN, BSN [phone] patient continues to bleed, patient will have a flex sigmoidscope with banding 10/24.  Patient received 3 u prbc on 10/22 and 2 u ffp with vit k on 10/22.  INR elevated, coumadin discontinued.  10/21/4 15:57 Letha Cape RN, BSN [phone] patient sitting in pool of blood this am.  will transfuse today.  Plan is for snf when medically stable.

## 2013-05-12 NOTE — Progress Notes (Signed)
Pt has bowel movement with primarily frank red blood and a small amount of stool. While cleaning patient up, frank red blood was dripping consistently from her rectum. Blood transfusing now, vitals stable, patient asymptomatic, will continue to monitor. [place], [employer]

## 2013-05-12 NOTE — Progress Notes (Signed)
Subjective: No abdominal pain.  Some episodes of red blood on diapers. Stool appears non-bloody per nursing.  Objective: Vital signs in last 24 hours: Temp:  [98 F (36.7 C)-98.5 F (36.9 C)] 98.4 F (36.9 C) (10/21 1525) Pulse Rate:  [58-79] 79 (10/21 1525) Resp:  [17-20] 20 (10/21 1525) BP: (111-144)/(52-81) 144/78 mmHg (10/21 1525) SpO2:  [96 %-99 %] 97 % (10/21 1525) Weight change:  Last BM Date: 05/11/13  PE: GEN:  Non-toxic appearing, younger-appearing than stated age ABD:  Soft Rectal:  External hemorrhoids, one area of excoriation with mild oozing; no palpable mass  CBC    Component Value Date/Time   WBC 5.4 05/12/2013 0605   WBC 5.3 05/10/2009 1318   RBC 2.49* 05/12/2013 0605   RBC 3.05* 04/09/2013 1833   RBC 3.45* 05/10/2009 1318   HGB 7.9* 05/12/2013 0605   HGB 11.3* 05/10/2009 1318   HCT 23.3* 05/12/2013 0605   HCT 33.3* 05/10/2009 1318   PLT 174 05/12/2013 0605   PLT 185 05/10/2009 1318   MCV 93.6 05/12/2013 0605   MCV 96.5 05/10/2009 1318   MCH 31.7 05/12/2013 0605   MCH 32.7 05/10/2009 1318   MCHC 33.9 05/12/2013 0605   MCHC 33.8 05/10/2009 1318   RDW 17.6* 05/12/2013 0605   RDW 16.4* 05/10/2009 1318   LYMPHSABS 0.9 05/08/2013 1353   LYMPHSABS 0.9 05/10/2009 1318   MONOABS 0.4 05/08/2013 1353   MONOABS 0.4 05/10/2009 1318   EOSABS 0.1 05/08/2013 1353   EOSABS 0.1 05/10/2009 1318   BASOSABS 0.0 05/08/2013 1353   BASOSABS 0.1 05/10/2009 1318   Assessment:  1.  Blood in diapers.  Suspect external hemorrhoids based on today's exam.  Plan:  1.   Topical hemorrhoidal therapy (Proctofoam ordered). 2.   If not improved with proctofoam, then could consider sigmoidoscopy as next step in evaluation, but would like to avoid endoscopic evaluation given patients age and comorbidities, if at all possible.   [name] 05/12/2013, 4:14 PM

## 2013-05-13 ENCOUNTER — Inpatient Hospital Stay (HOSPITAL_COMMUNITY): Payer: Medicare Other

## 2013-05-13 LAB — CBC
HCT: 30.4 % — ABNORMAL LOW (ref 36.0–46.0)
Hemoglobin: 10.2 g/dL — ABNORMAL LOW (ref 12.0–15.0)
MCH: 29.7 pg (ref 26.0–34.0)
MCH: 29.7 pg (ref 26.0–34.0)
MCHC: 33.9 g/dL (ref 30.0–36.0)
Platelets: 156 10*3/uL (ref 150–400)
Platelets: 156 10*3/uL (ref 150–400)
RBC: 3.2 MIL/uL — ABNORMAL LOW (ref 3.87–5.11)
RBC: 3.53 MIL/uL — ABNORMAL LOW (ref 3.87–5.11)
RBC: 3.43 MIL/uL — ABNORMAL LOW (ref 3.87–5.11)
RDW: 22.1 % — ABNORMAL HIGH (ref 11.5–15.5)
RDW: 22.4 % — ABNORMAL HIGH (ref 11.5–15.5)
WBC: 6.2 10*3/uL (ref 4.0–10.5)
WBC: 6 10*3/uL (ref 4.0–10.5)
WBC: 6.2 10*3/uL (ref 4.0–10.5)

## 2013-05-13 LAB — TYPE AND SCREEN
ABO/RH(D): O POS
Antibody Screen: NEGATIVE
Unit division: 0

## 2013-05-13 LAB — PROTIME-INR: INR: 1.94 — ABNORMAL HIGH (ref 0.00–1.49)

## 2013-05-13 MED ORDER — PHYTONADIONE 5 MG PO TABS
5.0000 mg | ORAL_TABLET | Freq: Once | ORAL | Status: AC
  Administered 2013-05-13: 5 mg via ORAL
  Filled 2013-05-13: qty 1

## 2013-05-13 MED ORDER — FUROSEMIDE 10 MG/ML IJ SOLN
20.0000 mg | Freq: Once | INTRAMUSCULAR | Status: AC
  Administered 2013-05-13: 20 mg via INTRAVENOUS
  Filled 2013-05-13 (×2): qty 2

## 2013-05-13 MED ORDER — POLYETHYLENE GLYCOL 3350 17 G PO PACK
17.0000 g | PACK | Freq: Two times a day (BID) | ORAL | Status: DC
  Administered 2013-05-13 – 2013-05-15 (×5): 17 g via ORAL
  Filled 2013-05-13 (×6): qty 1

## 2013-05-13 NOTE — Progress Notes (Signed)
Occupational Therapy Treatment Patient Details Name: [name] MRN: [id number] DOB: [birth year] Today's Date: 05/13/2013 Time: 4098-1191 OT Time Calculation (min): 49 min  OT Assessment / Plan / Recommendation  History of present illness  77 YOAAF, woke up feeling worthless, no energy, no chest pain, maybe a little shortness of breath.  She drove to see her PCP and in his office SPO2 was 77% on room air.  She was then was transported by EMS to [facility] for further eval. Here Pro BNP is 9265, K+ low, H/H 9.4/28.8; INR 2.22    OT comments  Pt with significant bleeding from bottom after having BM and getting back to chair from [facility]        Equipment Recommendations  None recommended by OT       Frequency Min 2X/week   Progress towards OT Goals Progress towards OT goals: Progressing toward goals  Plan Discharge plan remains appropriate    Precautions / Restrictions Precautions Precautions: Fall Restrictions Weight Bearing Restrictions: No       ADL  Grooming: Set up;Wash/dry face Where Assessed - Grooming: Unsupported sitting Upper Body Bathing: Set up Where Assessed - Upper Body Bathing: Unsupported sitting Lower Body Bathing: Moderate assistance Where Assessed - Lower Body Bathing: Supported sit to stand Upper Body Dressing: Min guard Where Assessed - Upper Body Dressing: Unsupported sitting Lower Body Dressing: Maximal assistance Where Assessed - Lower Body Dressing: Supported sit to Pharmacist, hospital Method: Sit to stand;Stand pivot Acupuncturist: Bedside commode Toileting - Clothing Manipulation and Hygiene: Maximal assistance Where Assessed - Engineer, mining and Hygiene: Standing Tub/Shower Transfer: Maximal assistance Transfers/Ambulation Related to ADLs: Pt seemed to need increased A this OT visit.  Pt with significant bleeding upon standing. Rn notified and pt assisted to sitting in chair      OT Goals(current goals can now be found  in the care plan section)     Visit Information  Last OT Received On: 05/13/13 History of Present Illness:  89 YOAAF, woke up feeling worthless, no energy, no chest pain, maybe a little shortness of breath.  She drove to see her PCP and in his office SPO2 was 77% on room air.  She was then was transported by EMS to [facility] for further eval. Here Pro BNP is 9265, K+ low, H/H 9.4/28.8; INR 2.22           Cognition  Cognition Arousal/Alertness: Awake/alert Overall Cognitive Status: Within Functional Limits for tasks assessed Difficult to assess due to: Hard of hearing/deaf    Mobility  Bed Mobility Bed Mobility: Supine to Sit Supine to Sit: 4: Min assist Sitting - Scoot to Edge of Bed: 4: Min assist Transfers Transfers: Sit to Stand;Stand to Sit Sit to Stand: With upper extremity assist Stand to Sit: 3: Mod assist;With upper extremity assist          End of Session OT - End of Session Activity Tolerance: Patient tolerated treatment well Patient left: in chair;with call bell/phone within reach;with chair alarm set  GO     [name], [place] 05/13/2013, 10:16 AM

## 2013-05-13 NOTE — Progress Notes (Signed)
Pt had BRBPR x2. Pt has had moderate blood on bed pad tonight.Rectal cream has been given. RN will continue to monitor

## 2013-05-13 NOTE — Progress Notes (Signed)
[facility] PROGRESS NOTE Subjective Continues BRB with brown BM  Objective: Vital signs in last 24 hours: Temp:  [97.5 F (36.4 C)-98.5 F (36.9 C)] 98.4 F (36.9 C) (10/22 0452) Pulse Rate:  [73-79] 75 (10/22 0452) Resp:  [17-20] 20 (10/22 0452) BP: (113-144)/(62-83) 114/71 mmHg (10/22 0452) SpO2:  [96 %-99 %] 99 % (10/22 0452) Weight:  [62.7 kg (138 lb 3.7 oz)] 62.7 kg (138 lb 3.7 oz) (10/22 0532) Last BM Date: 05/13/13  Intake/Output from previous day: 10/21 0701 - 10/22 0700 In: 1188.3 [P.O.:480; Blood:708.3] Out: -  Intake/Output this shift:    PE: General--sleeping NAD Heart-- Lungs-- Abdomen--minimally distended, nontender  Lab Results:  Recent Labs  05/11/13 0425 05/11/13 1830 05/12/13 0605 05/13/13 0210  WBC 4.7 5.3 5.4 6.2  HGB 8.5* 9.5* 7.9* 9.5*  HCT 25.1* 28.7* 23.3* 28.0*  PLT 179 189 174 156   BMET  Recent Labs  05/11/13 0425 05/12/13 0605  NA 140 140  K 4.0 4.2  CL 107 107  CO2 21 22  CREATININE 1.79* 1.81*   LFT No results found for this basename: PROT, AST, ALT, ALKPHOS, BILITOT, BILIDIR, IBILI,  in the last 72 hours PT/INR  Recent Labs  05/11/13 0425 05/12/13 0605 05/13/13 0210  LABPROT 22.7* 22.5* 21.6*  INR 2.08* 2.05* 1.94*   PANCREAS No results found for this basename: LIPASE,  in the last 72 hours       Studies/Results: Dg Chest 2 View  05/11/2013   CLINICAL DATA:  Short of breath, cough  EXAM: CHEST  2 VIEW  COMPARISON:  Chest radiograph 04/14/2013  FINDINGS: Left-sided pacemaker with number loss stable enlarged cardiac silhouette. Small bilateral pleural effusions. There is central venous pulmonary congestion. Mild interstitial edema pattern. Low lung volumes. No pneumothorax.  IMPRESSION: Cardiomegaly, small effusions, and mild interstitial edema. No significant change from prior.   Electronically Signed   By: Genevive Bi M.D.   On: 05/11/2013 15:46    Medications: I have reviewed the  patient's current medications.  Assessment/Plan: 1. LGI Bleed. Probably hemorrhoids on proctofoam will add Miralax   Takirah Binford JR,Zavian Slowey L 05/13/2013, 7:31 AM

## 2013-05-13 NOTE — Progress Notes (Signed)
Physical Therapy Treatment Patient Details Name: [name] MRN: [id number] DOB: 24-Aug-1917 Today's Date: 05/13/2013 Time: 9562-1308 PT Time Calculation (min): 24 min  PT Assessment / Plan / Recommendation  History of Present Illness  89 YOAAF, woke up feeling worthless, no energy, no chest pain, maybe a little shortness of breath.  She drove to see her PCP and in his office SPO2 was 77% on room air.  She was then was transported by EMS to [facility] for further eval. Here Pro BNP is 9265, K+ low, H/H 9.4/28.8; INR 2.22    PT Comments   Pt Progressing well towards therapy goals with increased activity today.  Follow Up Recommendations  SNF     Equipment Recommendations  None recommended by PT       Frequency Min 3X/week   Progress towards PT Goals Progress towards PT goals: Progressing toward goals  Plan Current plan remains appropriate    Precautions / Restrictions Precautions Precautions: Fall Restrictions Weight Bearing Restrictions: No       Mobility  Bed Mobility Bed Mobility: Sit to Supine Sit to Supine: 4: Min assist;HOB flat Details for Bed Mobility Assistance: min assist to clear bed surface with elevating legs onto bed to lie down. Transfers Sit to Stand: 4: Min guard;From chair/3-in-1;With upper extremity assist;With armrests Stand to Sit: 4: Min guard;To bed;With upper extremity assist Details for Transfer Assistance: Cues for hand placement and to use arms to control descent to bed. Ambulation/Gait Ambulation/Gait Assistance: 4: Min assist Ambulation Distance (Feet): 150 Feet Assistive device: Rolling walker Ambulation/Gait Assistance Details: cues for posture and for walker position with gait.  Gait Pattern: Step-through pattern;Decreased stride length;Narrow base of support;Trunk flexed Gait velocity: decreased    Exercises General Exercises - Lower Extremity Ankle Circles/Pumps: AROM;Strengthening;Both;10 reps;Supine Long Arc Quad:  AROM;Strengthening;Both;10 reps;Seated Hip Flexion/Marching: AROM;Strengthening;Both;10 reps;Seated Toe Raises: AROM;Strengthening;Both;10 reps;Seated Heel Raises: AROM;Strengthening;Both;10 reps;Seated     PT Goals (current goals can now be found in the care plan section) Acute Rehab PT Goals Patient Stated Goal: to go home PT Goal Formulation: With patient/family Time For Goal Achievement: 05/18/13 Potential to Achieve Goals: Good  Visit Information  Last PT Received On: 05/13/13 Assistance Needed: +1 History of Present Illness:  89 YOAAF, woke up feeling worthless, no energy, no chest pain, maybe a little shortness of breath.  She drove to see her PCP and in his office SPO2 was 77% on room air.  She was then was transported by EMS to [facility] for further eval. Here Pro BNP is 9265, K+ low, H/H 9.4/28.8; INR 2.22     Subjective Data  Patient Stated Goal: to go home   Cognition  Cognition Arousal/Alertness: Awake/alert Behavior During Therapy: WFL for tasks assessed/performed Overall Cognitive Status: Within Functional Limits for tasks assessed Difficult to assess due to: Hard of hearing/deaf       End of Session PT - End of Session Equipment Utilized During Treatment: Gait belt;Oxygen Activity Tolerance: Patient tolerated treatment well Patient left: in bed;with call bell/phone within reach;with bed alarm set Nurse Communication: Mobility status   GP     Sallyanne Kuster 05/13/2013, 2:35 PM  Sallyanne Kuster, PTA Office- [phone]

## 2013-05-13 NOTE — Progress Notes (Signed)
[facility] PROGRESS NOTE  [name] AOZ:[id number] DOB: [birth year] DOA: 05/08/2013 PCP: No PCP Per Patient  HPI/Subjective: Very hard of hearing.  Complains of being cold.  In good spirits.  Assessment/Plan: Principal Problem:   BRBPR (bright red blood per rectum) Active Problems:   Chronic anticoagulation - on Warfarin   Aortic stenosis- moderate to moderate/ severe 04/11/13   Anemia   BRBPR -Rectal bleeding likely secondary to hemorrhoids.  She is having brown stool.  -Malignancy also in the differential diagnoses list. -After discussion with her cardiologist, Coumadin has been discontinued.  -Received 3 units of RBC total this admission as of 10/22. -Receiving 2 units of FFP and vitamin K 5 mg PO on 10/22 due to continued bleeding. -GI consulted, recommended watching, because of advanced age and comorbidities no invasive GI testing at this point.  Anemia -Acute blood loss anemia on background of chronic anemia. -Hemoglobin baseline is 8.8, hemoglobin dropped to 7.9 -Status post transfusion of one unit of packed RBCs on 10/18, and 2 units on 10/21   Supratherapeutic INR -Patient received FFP and vitamin K   -Coumadin discontinued. (Was on it for A fib) -Consider Aspirin 81 mg therapy once the patient stops bleeding per cardiology.   Aortic stenosis. -Aortic valve stenosis and mitral valve regurgitation.   Severe pulmonary hypertension with mixed systolic and diastolic heart failure -BNP 12524 on 10/21 -While our weights are some what unreliable - she weighs 138 lbs. -Dry weight approximately 115 per Dr. Erin Hearing note of 05/07/13 office visit. -Lasix 80 mg daily resumed 10/20 -Isordil -Receiving 40 mg IV lasix today with platelet transfusions. -CXR to eval on 10/22.   Code Status: DNR, confirmed with [name], niece/POA on 10/22 Family Communication: Plan discussed with the patient. Disposition Plan: Remains inpatient, SNF recommended by  PT.   Consultants:  Dr. Bosie Clos of [facility] GI  Procedures:  None  Antibiotics:  None   Objective: Filed Vitals:   05/13/13 1124  BP: 136/78  Pulse: 81  Temp: 98.1 F (36.7 C)  Resp: 22    Intake/Output Summary (Last 24 hours) at 05/13/13 1139 Last data filed at 05/13/13 1124  Gross per 24 hour  Intake 1080.83 ml  Output      0 ml  Net 1080.83 ml   Filed Weights   05/09/13 1204 05/13/13 0532  Weight: 61.236 kg (135 lb) 62.7 kg (138 lb 3.7 oz)    Exam: General: Comfortable, very hard of hearing. Significant bright red Blood dripping from patient even without BM.   HEENT: anicteric sclera, pupils reactive to light and accommodation, EOMI CVS: irreg rhythm, 3/6 systolic murmur, with click, not rubs or gallops Chest: diffuse wheezes, poor air movement. Abdomen: soft nontender, nondistended, normal bowel sounds, no organomegaly Extremities: no cyanosis, clubbing or edema noted bilaterally   Data Reviewed: Basic Metabolic Panel:  Recent Labs Lab 05/08/13 1353 05/09/13 0624 05/11/13 0425 05/12/13 0605  NA 142 142 140 140  K 4.4 4.1 4.0 4.2  CL 106 107 107 107  CO2 19 21 21 22   GLUCOSE 91 84 85 95  BUN 45* 45* 39* 37*  CREATININE 1.77* 1.89* 1.79* 1.81*  CALCIUM 9.1 8.8 9.0 8.6   Liver Function Tests:  Recent Labs Lab 05/08/13 1954  AST 31  ALT 13  ALKPHOS 183*  BILITOT 2.1*  PROT 6.6  ALBUMIN 3.7   CBC:  Recent Labs Lab 05/08/13 1353  05/10/13 0451 05/11/13 0425 05/11/13 1830 05/12/13 0605 05/13/13 0210  WBC 4.4  < >  4.7 4.7 5.3 5.4 6.2  NEUTROABS 3.0  --   --   --   --   --   --   HGB 8.8*  < > 8.6* 8.5* 9.5* 7.9* 9.5*  HCT 26.1*  < > 25.6* 25.1* 28.7* 23.3* 28.0*  MCV 93.5  < > 92.8 93.0 94.1 93.6 87.5  PLT 236  < > 178 179 189 174 156  < > = values in this interval not displayed. BNP (last 3 results)  Recent Labs  04/14/13 0605 04/29/13 2007 05/12/13 0605  PROBNP 12470.0* 9884.0* 12524.0*    Studies: Dg Chest 2  View  05/11/2013   CLINICAL DATA:  Short of breath, cough  EXAM: CHEST  2 VIEW  COMPARISON:  Chest radiograph 04/14/2013  FINDINGS: Left-sided pacemaker with number loss stable enlarged cardiac silhouette. Small bilateral pleural effusions. There is central venous pulmonary congestion. Mild interstitial edema pattern. Low lung volumes. No pneumothorax.  IMPRESSION: Cardiomegaly, small effusions, and mild interstitial edema. No significant change from prior.   Electronically Signed   By: Genevive Bi M.D.   On: 05/11/2013 15:46    Scheduled Meds: . donepezil  5 mg Oral QHS  . ezetimibe  10 mg Oral Daily  . furosemide  20 mg Intravenous Once  . furosemide  80 mg Oral Daily  . hydrALAZINE  25 mg Oral Q12H  . hydrocortisone-pramoxine  1 applicator Rectal BID  . isosorbide dinitrate  10 mg Oral BID  . LORazepam  0.5 mg Oral Q8H  . pantoprazole  40 mg Oral Daily  . polyethylene glycol  17 g Oral BID  . sodium chloride  3 mL Intravenous Q12H   Continuous Infusions:     Conley Canal  [facility] Pager [phone]  If 7PM-7AM, please contact night-coverage at [email or web address], password [facility] 05/13/2013, 11:39 AM  LOS: 5 days   Attending Patient seen and examined, agree with the above assessment and plan. DNR, not a candidate for aggressive therapy. Since still having heavy rectal bleeding, will need to reverse INR. If any further deterioration, will need to talk about hospice/comfort care with family.  Windell Norfolk MD

## 2013-05-14 LAB — BASIC METABOLIC PANEL
BUN: 33 mg/dL — ABNORMAL HIGH (ref 6–23)
CO2: 24 mEq/L (ref 19–32)
Chloride: 106 mEq/L (ref 96–112)
Creatinine, Ser: 1.61 mg/dL — ABNORMAL HIGH (ref 0.50–1.10)
GFR calc Af Amer: 31 mL/min — ABNORMAL LOW (ref >90)
GFR calc non Af Amer: 26 mL/min — ABNORMAL LOW (ref >90)
Sodium: 142 mEq/L (ref 135–145)

## 2013-05-14 LAB — PREPARE FRESH FROZEN PLASMA: Unit division: 0

## 2013-05-14 LAB — PROTIME-INR
INR: 1.64 — ABNORMAL HIGH (ref 0.00–1.49)
Prothrombin Time: 19 seconds — ABNORMAL HIGH (ref 11.6–15.2)

## 2013-05-14 LAB — CBC
HCT: 27.4 % — ABNORMAL LOW (ref 36.0–46.0)
HCT: 27.4 % — ABNORMAL LOW (ref 36.0–46.0)
Hemoglobin: 9.2 g/dL — ABNORMAL LOW (ref 12.0–15.0)
MCHC: 33.6 g/dL (ref 30.0–36.0)
MCV: 89.8 fL (ref 78.0–100.0)
MCV: 90.1 fL (ref 78.0–100.0)
RBC: 3.04 MIL/uL — ABNORMAL LOW (ref 3.87–5.11)
RDW: 21.8 % — ABNORMAL HIGH (ref 11.5–15.5)
WBC: 5.2 10*3/uL (ref 4.0–10.5)
WBC: 5.6 10*3/uL (ref 4.0–10.5)

## 2013-05-14 LAB — PRO B NATRIURETIC PEPTIDE: Pro B Natriuretic peptide (BNP): 14503 pg/mL — ABNORMAL HIGH (ref 0–450)

## 2013-05-14 MED ORDER — FUROSEMIDE 40 MG PO TABS
40.0000 mg | ORAL_TABLET | Freq: Two times a day (BID) | ORAL | Status: DC
  Filled 2013-05-14 (×3): qty 1

## 2013-05-14 MED ORDER — SODIUM CHLORIDE 0.9 % IV SOLN
INTRAVENOUS | Status: DC
  Administered 2013-05-14: 23:00:00 via INTRAVENOUS

## 2013-05-14 MED ORDER — FUROSEMIDE 40 MG PO TABS
40.0000 mg | ORAL_TABLET | Freq: Every day | ORAL | Status: DC
  Administered 2013-05-14: 18:00:00 40 mg via ORAL
  Filled 2013-05-14: qty 1

## 2013-05-14 MED ORDER — HYDROCORTISONE 2.5 % RE CREA
TOPICAL_CREAM | Freq: Four times a day (QID) | RECTAL | Status: DC
  Administered 2013-05-14 – 2013-05-15 (×5): via RECTAL
  Filled 2013-05-14: qty 28.35

## 2013-05-14 MED ORDER — FUROSEMIDE 10 MG/ML IJ SOLN
20.0000 mg | Freq: Once | INTRAMUSCULAR | Status: AC
  Administered 2013-05-14: 20 mg via INTRAVENOUS

## 2013-05-14 MED ORDER — PHYTONADIONE 5 MG PO TABS
5.0000 mg | ORAL_TABLET | Freq: Once | ORAL | Status: DC
  Filled 2013-05-14: qty 1

## 2013-05-14 NOTE — Progress Notes (Signed)
[facility] PROGRESS NOTE  [name] ZOX:[id number] DOB: 04/17/1918 DOA: 05/08/2013 PCP: No PCP Per Patient  HPI/Subjective: Very hard of hearing. Sleepy, not really awake yet enough to speak with me.  Assessment/Plan:  BRBPR -Rectal bleeding likely secondary to hemorrhoids and elevated INR.  Continues to drip red blood. -After discussion with her cardiologist, Coumadin has been discontinued.  -Received 3 units of RBC total this admission as of 10/22. -Received 2 units of FFP and vitamin K 5 mg PO on 10/22. -GI consulted.  Appreciate their help. Flex sig with banding planned on 10/24 -Curb sided surgery - nothing to offer inpatient.  Anemia -Acute blood loss anemia on background of chronic anemia. -Hemoglobin baseline is 8.8, hemoglobin dropped to 7.9 -Status post transfusion of one unit of packed RBCs on 10/18, and 2 units on 10/21  Supratherapeutic INR -decreasing 1.6 on 10/23 -Patient received FFP and vitamin K   -Coumadin discontinued. (Was on it for A fib) -Consider Aspirin 81 mg therapy once the patient stops bleeding per cardiology.  Aortic stenosis. -Aortic valve stenosis and mitral valve regurgitation.  Severe pulmonary hypertension with mixed systolic and diastolic heart failure -BNP elevated. -While our weights are some what unreliable - she weighs 138 lbs. -Dry weight approximately 115 per Dr. Erin Hearing note of 05/07/13 office visit. -Lasix 80 mg daily resumed 10/20.  -Receiving daily IV lasix as well (20 - 40 mg).  Will increase daily lasix dose to 120. -Isordil -CXR to eval on 10/22.   Code Status: DNR, confirmed with [name], niece/POA on 10/22 Family Communication: Plan discussed with the patient. Disposition Plan: Remains inpatient, SNF recommended by PT.   Consultants:  Dr. Bosie Clos of [facility] GI  Procedures:  None  Antibiotics:  None   Objective: Filed Vitals:   05/14/13 0440  BP: 99/51  Pulse: 78  Temp: 97.5 F  (36.4 C)  Resp: 18    Intake/Output Summary (Last 24 hours) at 05/14/13 0851 Last data filed at 05/13/13 1730  Gross per 24 hour  Intake 604.17 ml  Output      0 ml  Net 604.17 ml   Filed Weights   05/09/13 1204 05/13/13 0532  Weight: 61.236 kg (135 lb) 62.7 kg (138 lb 3.7 oz)    Exam: General: Comfortable, very hard of hearing. Bright red Blood dripping from patient even without BM.   HEENT: anicteric sclera, pupils reactive to light and accommodation, EOMI CVS: irreg rhythm, 3/6 systolic murmur, with click, not rubs or gallops Chest: diffuse wheezes, poor air movement. Abdomen: soft nontender, nondistended, normal bowel sounds, no organomegaly.  Several large nonthrombosed ext hemorrhoids Extremities: no cyanosis, clubbing or edema noted bilaterally Skin:  Dry, but no rashes, bruises or lesions.  Data Reviewed: Basic Metabolic Panel:  Recent Labs Lab 05/08/13 1353 05/09/13 0624 05/11/13 0425 05/12/13 0605 05/14/13 0545  NA 142 142 140 140 142  K 4.4 4.1 4.0 4.2 3.7  CL 106 107 107 107 106  CO2 19 21 21 22 24   GLUCOSE 91 84 85 95 91  BUN 45* 45* 39* 37* 33*  CREATININE 1.77* 1.89* 1.79* 1.81* 1.61*  CALCIUM 9.1 8.8 9.0 8.6 8.8   Liver Function Tests:  Recent Labs Lab 05/08/13 1954  AST 31  ALT 13  ALKPHOS 183*  BILITOT 2.1*  PROT 6.6  ALBUMIN 3.7   CBC:  Recent Labs Lab 05/08/13 1353  05/11/13 1830 05/12/13 0605 05/13/13 0210 05/13/13 1236 05/13/13 2205  WBC 4.4  < > 5.3 5.4  6.2 6.0 6.2  NEUTROABS 3.0  --   --   --   --   --   --   HGB 8.8*  < > 9.5* 7.9* 9.5* 10.3* 10.2*  HCT 26.1*  < > 28.7* 23.3* 28.0* 31.1* 30.4*  MCV 93.5  < > 94.1 93.6 87.5 88.1 88.6  PLT 236  < > 189 174 156 156 151  < > = values in this interval not displayed. BNP (last 3 results)  Recent Labs  04/29/13 2007 05/12/13 0605 05/14/13 0545  PROBNP 9884.0* 12524.0* 14503.0*    Studies: Dg Chest Port 1 View  05/13/2013   CLINICAL DATA:  CHF  EXAM: PORTABLE  CHEST - 1 VIEW  COMPARISON:  05/11/2013  FINDINGS: Moderate cardiomegaly. Low volumes. Small bilateral pleural effusions. Vascular congestion. Stable left subclavian pacemaker device. No pneumothorax.  IMPRESSION: Stable small pleural effusions and vascular congestion.   Electronically Signed   By: Maryclare Bean M.D.   On: 05/13/2013 13:22    Scheduled Meds: . donepezil  5 mg Oral QHS  . ezetimibe  10 mg Oral Daily  . furosemide  20 mg Intravenous Once  . furosemide  80 mg Oral Daily  . hydrALAZINE  25 mg Oral Q12H  . hydrocortisone   Rectal QID  . hydrocortisone-pramoxine  1 applicator Rectal BID  . isosorbide dinitrate  10 mg Oral BID  . LORazepam  0.5 mg Oral Q8H  . pantoprazole  40 mg Oral Daily  . polyethylene glycol  17 g Oral BID  . sodium chloride  3 mL Intravenous Q12H   Continuous Infusions:     Conley Canal  [facility] Pager [phone]  If 7PM-7AM, please contact night-coverage at [email or web address], password [facility] 05/14/2013, 8:51 AM  LOS: 6 days    Attending - Patient seen and examined, the with the above assessment and plan.  GI, plans are for flexible sigmoidoscopy and possible banding of external hemorrhoids tomorrow. In interim continue with supportive care.  Windell Norfolk MD

## 2013-05-14 NOTE — Clinical Social Work Placement (Addendum)
Clinical Social Work Department CLINICAL SOCIAL WORK PLACEMENT NOTE 05/14/2013  Patient:  AMOUR, TRIGG  Account Number:  [id number] Admit date:  05/08/2013  Clinical Social Worker:  Cherre Blanc, [place]  Date/time:  05/14/2013 04:15 PM  Clinical Social Work is seeking post-discharge placement for this patient at the following level of care:   SKILLED NURSING   (*CSW will update this form in Epic as items are completed)   05/12/2013  Patient/family provided with [facility] Department of Clinical Social Work's list of facilities offering this level of care within the geographic area requested by the patient (or if unable, by the patient's family).  05/12/2013  Patient/family informed of their freedom to choose among providers that offer the needed level of care, that participate in Medicare, Medicaid or managed care program needed by the patient, have an available bed and are willing to accept the patient.  05/12/2013  Patient/family informed of [facility]' ownership interest in [facility], as well as of the fact that they are under no obligation to receive care at this facility.  PASARR submitted to EDS on  PASARR number received from EDS on exisiting # [id number]   FL2 transmitted to all facilities in geographic area requested by pt/family on  05/12/2013 FL2 transmitted to all facilities within larger geographic area on 05/12/2013  Patient informed that his/her managed care company has contracts with or will negotiate with  certain facilities, including the following:     Patient/family informed of bed offers received:  05/12/2013 Patient chooses bed at [facility] Physician recommends and patient chooses bed at    Patient to be transferred to [facility] on  05/15/13 Patient to be transferred to facility by   The following physician request were entered in Epic:   Additional Comments:  Roddie Mc, [place],  Bridget Hartshorn, [phone]

## 2013-05-14 NOTE — Clinical Social Work Note (Signed)
Per MD patient not stable for DC today. CSW will continue to follow for DC needs. Patient has bed at [facility] once stable.   Roddie Mc, Paac [place], [place], [phone]

## 2013-05-14 NOTE — Progress Notes (Addendum)
[facility] PROGRESS NOTE Subjective Stools brown, BRB small amt in bed. Coumadin stopped, FFP given  Objective: Vital signs in last 24 hours: Temp:  [97.5 F (36.4 C)-99.1 F (37.3 C)] 97.5 F (36.4 C) (10/23 0440) Pulse Rate:  [64-96] 78 (10/23 0440) Resp:  [18-22] 18 (10/23 0440) BP: (99-139)/(51-78) 99/51 mmHg (10/23 0440) SpO2:  [95 %-98 %] 97 % (10/23 0440) Last BM Date: 05/13/13  Intake/Output from previous day: 10/22 0701 - 10/23 0700 In: 604.2 [Blood:604.2] Out: -  Intake/Output this shift:    PE: General--sleeping not awakened   Lab Results:  Recent Labs  05/11/13 1830 05/12/13 0605 05/13/13 0210 05/13/13 1236 05/13/13 2205  WBC 5.3 5.4 6.2 6.0 6.2  HGB 9.5* 7.9* 9.5* 10.3* 10.2*  HCT 28.7* 23.3* 28.0* 31.1* 30.4*  PLT 189 174 156 156 151   BMET  Recent Labs  05/12/13 0605 05/14/13 0545  NA 140 142  K 4.2 3.7  CL 107 106  CO2 22 24  CREATININE 1.81* 1.61*   LFT No results found for this basename: PROT, AST, ALT, ALKPHOS, BILITOT, BILIDIR, IBILI,  in the last 72 hours PT/INR  Recent Labs  05/12/13 0605 05/13/13 0210 05/14/13 0545  LABPROT 22.5* 21.6* 19.0*  INR 2.05* 1.94* 1.64*   PANCREAS No results found for this basename: LIPASE,  in the last 72 hours       Studies/Results: Dg Chest Port 1 View  05/13/2013   CLINICAL DATA:  CHF  EXAM: PORTABLE CHEST - 1 VIEW  COMPARISON:  05/11/2013  FINDINGS: Moderate cardiomegaly. Low volumes. Small bilateral pleural effusions. Vascular congestion. Stable left subclavian pacemaker device. No pneumothorax.  IMPRESSION: Stable small pleural effusions and vascular congestion.   Electronically Signed   By: Maryclare Bean M.D.   On: 05/13/2013 13:22    Medications: I have reviewed the patient's current medications.  Assessment/Plan: 1. LGI Bleed. Probably hemorrhoids being treated conservatively. Would continue the miralax and see how she does with bleeding off of  anticoagulation.   [name],[name] 05/14/2013, 7:17 AM  Discussed with [name]. We will perform sigmoidoscopy after enemas in the morning at 8 o'clock

## 2013-05-14 NOTE — Progress Notes (Signed)
Patient up in chair tolerating sitting up in chair. Denies dizziness feeds self . Patient noted with small to moderate amount of rectal bleeding throughout day . anusol ointment applied as ordered . Patient denies pain at this time. Consent form signed by 2 RN's via telephone conversation with niece . Continue with plan of care.          Cleotilde Neer

## 2013-05-15 ENCOUNTER — Encounter (HOSPITAL_COMMUNITY): Payer: Self-pay | Admitting: *Deleted

## 2013-05-15 ENCOUNTER — Encounter (HOSPITAL_COMMUNITY)
Admission: EM | Disposition: A | Discharge status: Skilled Nursing Facility | Payer: Self-pay | Source: Home / Self Care | Attending: Internal Medicine

## 2013-05-15 DIAGNOSIS — K624 Stenosis of anus and rectum: Secondary | ICD-10-CM | POA: Diagnosis present

## 2013-05-15 HISTORY — PX: FLEXIBLE SIGMOIDOSCOPY: SHX5431

## 2013-05-15 LAB — CBC
HCT: 27.3 % — ABNORMAL LOW (ref 36.0–46.0)
HCT: 25.6 % — ABNORMAL LOW (ref 36.0–46.0)
Hemoglobin: 8.5 g/dL — ABNORMAL LOW (ref 12.0–15.0)
Hemoglobin: 8.5 g/dL — ABNORMAL LOW (ref 12.0–15.0)
MCH: 28.3 pg (ref 26.0–34.0)
MCH: 29.8 pg (ref 26.0–34.0)
MCHC: 33.2 g/dL (ref 30.0–36.0)
MCV: 88.9 fL (ref 78.0–100.0)
Platelets: 156 10*3/uL (ref 150–400)
Platelets: 159 10*3/uL (ref 150–400)
RBC: 2.85 MIL/uL — ABNORMAL LOW (ref 3.87–5.11)
RBC: 3 MIL/uL — ABNORMAL LOW (ref 3.87–5.11)
RBC: 3.07 MIL/uL — ABNORMAL LOW (ref 3.87–5.11)
RDW: 20.7 % — ABNORMAL HIGH (ref 11.5–15.5)
RDW: 20.9 % — ABNORMAL HIGH (ref 11.5–15.5)
WBC: 5.8 10*3/uL (ref 4.0–10.5)
WBC: 5.9 10*3/uL (ref 4.0–10.5)

## 2013-05-15 LAB — PROTIME-INR: INR: 1.6 — ABNORMAL HIGH (ref 0.00–1.49)

## 2013-05-15 SURGERY — SIGMOIDOSCOPY, FLEXIBLE
Anesthesia: Moderate Sedation

## 2013-05-15 MED ORDER — FUROSEMIDE 10 MG/ML IJ SOLN
40.0000 mg | Freq: Two times a day (BID) | INTRAMUSCULAR | Status: DC
  Administered 2013-05-15: 40 mg via INTRAVENOUS
  Filled 2013-05-15: qty 4

## 2013-05-15 MED ORDER — DIPHENHYDRAMINE HCL 50 MG/ML IJ SOLN
INTRAMUSCULAR | Status: AC
  Filled 2013-05-15: qty 1

## 2013-05-15 MED ORDER — POLYETHYLENE GLYCOL 3350 17 G PO PACK: 17.0000 g | PACK | Freq: Two times a day (BID) | ORAL | Status: DC

## 2013-05-15 MED ORDER — MIDAZOLAM HCL 10 MG/2ML IJ SOLN
INTRAMUSCULAR | Status: DC | PRN
  Administered 2013-05-15: 1 mg via INTRAVENOUS

## 2013-05-15 MED ORDER — FUROSEMIDE 40 MG PO TABS: 60.0000 mg | ORAL_TABLET | Freq: Two times a day (BID) | ORAL | Status: DC

## 2013-05-15 MED ORDER — HYDROCORTISONE 2.5 % RE CREA: TOPICAL_CREAM | Freq: Two times a day (BID) | RECTAL | Status: DC

## 2013-05-15 MED ORDER — FENTANYL CITRATE 0.05 MG/ML IJ SOLN
INTRAMUSCULAR | Status: DC | PRN
  Administered 2013-05-15 (×2): 12.5 ug via INTRAVENOUS

## 2013-05-15 MED ORDER — MIDAZOLAM HCL 5 MG/ML IJ SOLN
INTRAMUSCULAR | Status: AC
  Filled 2013-05-15: qty 1

## 2013-05-15 MED ORDER — FENTANYL CITRATE 0.05 MG/ML IJ SOLN
INTRAMUSCULAR | Status: AC
  Filled 2013-05-15: qty 2

## 2013-05-15 MED ORDER — HYDROCORTISONE ACE-PRAMOXINE 1-1 % RE FOAM: 1.0000 | Freq: Two times a day (BID) | RECTAL | Status: DC

## 2013-05-15 NOTE — Progress Notes (Signed)
Covering Clinical Social Worker (CSW) informed that pt is ready for discharged to [facility]. Pt and niece aware and agreeable. DC packet placed in shadow chart. [facility] contacted for 14:00 transport to [facility], per RN request. No additional needs, CSW signing off.  Theresia Bough, MSW, LCSW [phone]

## 2013-05-15 NOTE — Progress Notes (Signed)
Tap water enema 1000 ml given. Pt had a large amount of brown, clumpy bowel movement with a small amount of blood. Repeated tap water enema. Pt could only handle 500 ml of second tap water enema. Pt had one watery, brown bowel movement followed by another watery bowel movement with some clumps. Letting pt rest for now. Will attempt again later.

## 2013-05-15 NOTE — Progress Notes (Signed)
PT Cancellation Note  Patient Details Name: [name] MRN: [id number] DOB: Mar 28, 1918   Cancelled Treatment:    Reason Eval/Treat Not Completed: Other (comment) (Pt at flex sig earlier and now sleeping soundly.)   Linnea Todisco 05/15/2013, 11:58 AM

## 2013-05-15 NOTE — Progress Notes (Addendum)
Notified MD about pt not being clear for procedure this morning. MD gave order to give another tap water enema. One more liter tap water enema given. Pt had watery, light brown bowel movement with undigested food particles. Will pass on to day shift RN.

## 2013-05-15 NOTE — Progress Notes (Signed)
Another liter tap water enema given. Pt had a light brown, watery bowel movement with pieces of undigested food particles.

## 2013-05-15 NOTE — Discharge Summary (Signed)
Physician Discharge Summary  [name]:[id number] DOB: 1918/01/31 DOA: 05/08/2013  PCP: No PCP Per Patient  Admit date: 05/08/2013 Discharge date: 05/15/2013  Time spent: 60 minutes  Recommendations for Outpatient Follow-up:  Patient off coumadin for life due to recurrent GI bleeding.  Now on 81 mg aspirin for Afib. Patient has severe anal stenosis.  Needs stools to be kept almost watery to pass. Patient with severe CHF with tendency toward volume overload.  See Dr. Royann Shivers within 1 week. Daily weights Check CBC, BMET frequently for anemia and chronic kidney disease.  Discharge Diagnoses:  Principal Problem:   BRBPR (bright red blood per rectum) Active Problems:   Chronic anticoagulation - on Warfarin   Aortic stenosis- moderate to moderate/ severe 04/11/13   Anemia   Rectal/anal stenosis   Discharge Condition: stable.  Diet recommendation: low salt diet  Filed Weights   05/09/13 1204 05/13/13 0532  Weight: 61.236 kg (135 lb) 62.7 kg (138 lb 3.7 oz)    History of present illness at the time of admission:   [name] is a 79 y.o. female on chronic coumadin for A.Fib who presents to the ED with c/o rectal bleeding. Symptoms onset last night, she is unable to qualify the amount but states no clots were present. No abd pain, no fever, no vomiting. She does note dizziness that comes and goes. Small amount of blood noted on glove during EDPs rectal exam, external hemorrhoids noted but none appear to be the source of bleeding.   Hospital Course:  BRBPR  -Rectal bleeding likely secondary to hemorrhoids and anal stenosis and elevated INR. Will occasionally have red blood per rectum. -After discussion with her cardiologist, Coumadin has been discontinued. Family aware of cardio-embolic risk and accepting. -Received 3 units of RBC total this admission as of 10/22. Hb stable at 8.9 on the day of discharge -Received 2 units of FFP and vitamin K 5 mg PO on 10/22.  -GI  consulted. Patient received multiple enemas on 10/24 and Flex sig was completed in the am.  No signs of bleeding.  Multiple internal and ext hemorrhoids. -Curb sided surgery - nothing to offer inpatient.   Anemia  -Acute blood loss anemia on background of chronic anemia.  -Hemoglobin baseline is approx 9.0, hemoglobin dropped to 7.9  -Status post transfusion of 3 units of red blood cells this admission. Hb stable at 8.9 on the day of discharge  Supratherapeutic INR  -decreasing 1.6 on 10/23  -Patient received FFP and vitamin K  -Coumadin discontinued. (Was on it for A fib)  -Consider Aspirin 81 mg therapy per cardiology.   Hx of Afib -coumadin discontinued permanantly-after risk explained to family and after d/w cardiology -maintain on ASA  Severe pulmonary hypertension with mixed systolic and diastolic heart failure  -BNP elevated.  Very susceptible to fluid over load. -While our weights are some what unreliable - she weighs 138 lbs. Dry weight approximately 115 per Dr. Erin Hearing note of 05/07/13 office visit.  -Receiving daily IV lasix as well (20 - 40 mg) while inpatient, on discharge will place on Lasix 60 mg BID -please have patient follow with Cardiology in 1 week.   Aortic stenosis.  -Aortic valve stenosis and mitral valve regurgitation.   Code Status: DNR, confirmed with [name], niece/POA on 10/22  Procedures:  Flex sigmoidoscopy on 10/24  ENDOSCOPIC IMPRESSION:   Rectal bleeding. Probably due to internal and external hemorrhoids.   The patient also has anal stenosis.  Stools need to  be kept almost watery.   RECOMMENDATIONS:   Would treat conservatively with Miralax and stool softener's.   Would consider chronic Anusol cream.   Would not recommend surgical Intervention if she responds to conservative therapy   Consultations:  GI  Discharge Exam: Filed Vitals:   05/15/13 0900  BP: 140/85  Pulse: 62  Temp:   Resp: 17   General: Comfortable,  very hard of hearing. Bright red Blood dripping from patient even without BM.  HEENT: anicteric sclera, pupils reactive to light and accommodation, EOMI  CVS: irreg rhythm, 3/6 systolic murmur, with click, not rubs or gallops  Chest: diffuse wheezes, poor air movement.  Abdomen: soft nontender, nondistended, normal bowel sounds, no organomegaly. Several large nonthrombosed ext hemorrhoids  Extremities: no cyanosis, clubbing or edema noted bilaterally  Skin: Dry, but no rashes, bruises or lesions.    Discharge Instructions      Discharge Orders   Future Appointments Provider Department Dept Phone   05/22/2013 10:45 AM Thurmon Fair, MD [facility] [phone]   Future Orders Complete By Expires   Diet - low sodium heart healthy  As directed    Increase activity slowly  As directed        Medication List    STOP taking these medications       warfarin 5 MG tablet  Commonly known as:  COUMADIN      TAKE these medications       aspirin 81 MG tablet  Take 1 tablet (81 mg total) by mouth daily.  Start taking on:  05/17/2013     donepezil 5 MG tablet  Commonly known as:  ARICEPT  Take 5 mg by mouth at bedtime.     ezetimibe 10 MG tablet  Commonly known as:  ZETIA  Take 10 mg by mouth daily.     furosemide 40 MG tablet  Commonly known as:  LASIX  Take 1.5 tablets (60 mg total) by mouth 2 (two) times daily.     hydrALAZINE 25 MG tablet  Commonly known as:  APRESOLINE  Take 1 tablet (25 mg total) by mouth every 12 (twelve) hours.     hydrocortisone 2.5 % rectal cream  Commonly known as:  PROCTOSOL HC  Place rectally 2 (two) times daily.     hydrocortisone-pramoxine rectal foam  Commonly known as:  PROCTOFOAM-HC  Place 1 applicator rectally 2 (two) times daily.     isosorbide dinitrate 10 MG tablet  Commonly known as:  ISORDIL  Take 1 tablet (10 mg total) by mouth 2 (two) times daily.     LORazepam 0.5 MG tablet  Commonly known as:  ATIVAN  Take  1 tablet (0.5 mg total) by mouth every 8 (eight) hours.     nitroGLYCERIN 0.4 MG SL tablet  Commonly known as:  NITROSTAT  Place 1 tablet (0.4 mg total) under the tongue every 5 (five) minutes as needed for chest pain.     pantoprazole 40 MG tablet  Commonly known as:  PROTONIX  Take 40 mg by mouth daily.     polyethylene glycol packet  Commonly known as:  MIRALAX / GLYCOLAX  Take 17 g by mouth 2 (two) times daily.     potassium chloride 10 MEQ tablet  Commonly known as:  KLOR-CON M10  Take 1 tablet (10 mEq total) by mouth daily.       Allergies  Allergen Reactions  . Penicillins Swelling  . Simvastatin Other (See Comments)    Myalgia   .  Ace Inhibitors Cough  . Amlodipine Besylate Rash  . Aspirin Nausea Only, Rash and Other (See Comments)    GI distress  . Food Other (See Comments)    Berries,tomato  . Nutritional Supplements Other (See Comments)    Unknown.   Follow-up Information   Follow up with CROITORU,MIHAI, MD. Schedule an appointment as soon as possible for a visit in 1 week.   Specialty:  Cardiology   Contact information:   [address] [place] [address] [phone]        The results of significant diagnostics from this hospitalization (including imaging, microbiology, ancillary and laboratory) are listed below for reference.    Significant Diagnostic Studies: Ct Abdomen Pelvis Wo Contrast  05/08/2013   CLINICAL DATA:  Diffuse abdominal pain, rectal bleeding.  EXAM: CT ABDOMEN AND PELVIS WITHOUT CONTRAST  TECHNIQUE: Multidetector CT imaging of the abdomen and pelvis was performed following the standard protocol without intravenous contrast.  COMPARISON:  The CT to 17 2012  FINDINGS: There are bilateral small pleural effusions. No focal consolidation at the lung bases. Heart is enlarged. No pericardial fluid.  There is interval development of a moderate to large volume of intraperitoneal free fluid throughout the abdomen and pelvis.  This fluid is low attenuation suggesting ascites. This makes evaluation of the abdominal and pelvic contents difficult on this non contrast exam.  Oral contrast does pass through the entirety of the bowel to the rectum. No mass lesion or obstruction identified. Liver is grossly normal. The pancreas, spleen, adrenal glands and kidneys are grossly normal.  There is a uterus is normal. The bladder is distended but appears normal.  The abdominal aorta and renal arteries are heavily calcified. No aneurysm. Posterior lumbar fusion noted.  IMPRESSION: 1. Large volume of intraperitoneal free fluid consists with ascites.  2. No evidence of bowel obstruction.  3.  Small bilateral pleural effusions.   Electronically Signed   By: Genevive Bi M.D.   On: 05/08/2013 18:20   Dg Chest 2 View  05/11/2013   CLINICAL DATA:  Short of breath, cough  EXAM: CHEST  2 VIEW  COMPARISON:  Chest radiograph 04/14/2013  FINDINGS: Left-sided pacemaker with number loss stable enlarged cardiac silhouette. Small bilateral pleural effusions. There is central venous pulmonary congestion. Mild interstitial edema pattern. Low lung volumes. No pneumothorax.  IMPRESSION: Cardiomegaly, small effusions, and mild interstitial edema. No significant change from prior.   Electronically Signed   By: Genevive Bi M.D.   On: 05/11/2013 15:46   Dg Chest Port 1 View  05/13/2013   CLINICAL DATA:  CHF  EXAM: PORTABLE CHEST - 1 VIEW  COMPARISON:  05/11/2013  FINDINGS: Moderate cardiomegaly. Low volumes. Small bilateral pleural effusions. Vascular congestion. Stable left subclavian pacemaker device. No pneumothorax.  IMPRESSION: Stable small pleural effusions and vascular congestion.   Electronically Signed   By: Maryclare Bean M.D.   On: 05/13/2013 13:22   Dg Abd Acute W/chest  05/08/2013   CLINICAL DATA:  Rectal bleeding.  EXAM: ACUTE ABDOMEN SERIES (ABDOMEN 2 VIEW & CHEST 1 VIEW)  COMPARISON:  Chest x-ray 04/14/2013.  FINDINGS: The upright chest x-ray  demonstrates stable cardiac enlargement and moderate vascular congestion. The pacer wire is stable. Small pleural effusions are suspected.  Two views of the abdomen demonstrate air-filled small bowel loops in with scattered air-fluid levels in but no significant distention. This could reflect an early small bowel obstruction. Very little colonic air. Overall increased density and slight centralization of the  bowel loops could be due to ascites. No free air. The soft tissue shadows are grossly maintained. The bony structures are intact. Lumbar fusion hardware is noted. Extensive vascular calcifications are present.  IMPRESSION: Cardiac enlargement and mild vascular congestion.  Possible early small bowel obstruction and possible ascites.   Electronically Signed   By: Loralie Champagne M.D.   On: 05/08/2013 14:43    Labs: Basic Metabolic Panel:  Recent Labs Lab 05/08/13 1353 05/09/13 0624 05/11/13 0425 05/12/13 0605 05/14/13 0545  NA 142 142 140 140 142  K 4.4 4.1 4.0 4.2 3.7  CL 106 107 107 107 106  CO2 19 21 21 22 24   GLUCOSE 91 84 85 95 91  BUN 45* 45* 39* 37* 33*  CREATININE 1.77* 1.89* 1.79* 1.81* 1.61*  CALCIUM 9.1 8.8 9.0 8.6 8.8   Liver Function Tests:  Recent Labs Lab 05/08/13 1954  AST 31  ALT 13  ALKPHOS 183*  BILITOT 2.1*  PROT 6.6  ALBUMIN 3.7   CBC:  Recent Labs Lab 05/08/13 1353  05/13/13 2205 05/14/13 1118 05/14/13 1520 05/15/13 05/15/13 0420  WBC 4.4  < > 6.2 5.2 5.6 5.8 5.9  NEUTROABS 3.0  --   --   --   --   --   --   HGB 8.8*  < > 10.2* 9.2* 9.1* 8.5* 8.9*  HCT 26.1*  < > 30.4* 27.4* 27.4* 26.8* 27.3*  MCV 93.5  < > 88.6 89.8 90.1 89.3 88.9  PLT 236  < > 151 161 160 159 156  < > = values in this interval not displayed.   BNP (last 3 results)  Recent Labs  04/29/13 2007 05/12/13 0605 05/14/13 0545  PROBNP 9884.0* 12524.0* 14503.0*    Signed: Michele Rockers PA-C [id number]  [facility] 05/15/2013, 9:51 AM  Attending Patient  seen and examined, agree with the assessment and plan as outlined above. Per RN no further rectal bleeding overnight-flex sig done today-anal stenosis and hemorrhoids seen, will need ongoing conservative treatment. Will likely have periodic hematochezia. Stable for discharge to SNF  S Ghimire

## 2013-05-15 NOTE — Progress Notes (Signed)
10/24/134 patient discharged to [facility] ,IV removed, call placed to facility to inform of coming today.

## 2013-05-15 NOTE — Op Note (Signed)
[facility] [address] [place], [address]   FLEXIBLE SIGMOIDOSCOPY PROCEDURE REPORT  PATIENT: [name], [name]  MR#: [id number] BIRTHDATE: Jun 06, 1918 , 89  yrs. old GENDER: Female ENDOSCOPIST: Carman Ching, MD REFERRED BY:  [facility] PROCEDURE DATE:  05/15/2013 PROCEDURE:  Flexible Sigmoidoscopy ASA CLASS:    class IV INDICATIONS: continue rectal bleeding MEDICATIONS:fentanyl 25 mcg versed 1 mg IV  DESCRIPTION OF PROCEDURE:   The procedure had been discussed with the patients niece and consent obtained. The patient had been prep with tapwater enemas. A digital exam was performed. The patient had anal stenosis and probably resolving external hemorrhoids. The Pentax adult scope inserted with some resistance. The crap was adequate. We're able to advance to 55 cm which was felt to be at the junction of the sigmoid colon descending colon. This area was grossly normal other than some stool. There was no active bleeding and no sign of blood coming from above. The scope was withdrawn. The mucosa was irrigated and examined. There was no significant diverticular disease, AVMs, polyps or masses seen. The mucosa had a pink appearance and there was no sign of ischemia. In the rectum there were hemorrhoids seen in the retroflex view. Hemorrhoids were also seen in the anal canal upon what Strahl of the scope. There was one area just inside the rectum of echymoses consistent with trauma from the enema tube insertion. There was no active bleeding from this. The patient tolerated procedure well. Results were discussed with the patient's niece by phone.        COMPLICATIONS:  None  ENDOSCOPIC IMPRESSION: 1. Rectal bleeding. Probably due to internal or external hemorrhoids. The patient also appears to have anal stenosis.  RECOMMENDATIONS: 1. Would treat conservatively with Miralax and stool softener's. Would consider chronic Anusol cream. Would  not recommend surgical intervention if she responds to conservative therapy   _______________________________ eSigned:  Carman Ching, MD 05/15/2013 8:49 AM

## 2013-05-15 NOTE — Interval H&P Note (Signed)
History and Physical Interval Note:  05/15/2013 8:12 AM  [name]  has presented today for surgery, with the diagnosis of GI bleed  The various methods of treatment have been discussed with the patient and family. After consideration of risks, benefits and other options for treatment, the patient has consented to  Procedure(s): FLEXIBLE SIGMOIDOSCOPY (N/A) as a surgical intervention .  The patient's history has been reviewed, patient examined, no change in status, stable for surgery.  I have reviewed the patient's chart and labs.  Questions were answered to the patient's satisfaction.     Amaya Blakeman JR,Jezel Basto L

## 2013-05-15 NOTE — Progress Notes (Signed)
Another liter of tap water enema given. Pt still having brown, watery bowel movements with pieces of undigested food particles.

## 2013-05-15 NOTE — H&P (View-Only) (Signed)
[facility] PROGRESS NOTE Subjective Stools brown, BRB small amt in bed. Coumadin stopped, FFP given  Objective: Vital signs in last 24 hours: Temp:  [97.5 F (36.4 C)-99.1 F (37.3 C)] 97.5 F (36.4 C) (10/23 0440) Pulse Rate:  [64-96] 78 (10/23 0440) Resp:  [18-22] 18 (10/23 0440) BP: (99-139)/(51-78) 99/51 mmHg (10/23 0440) SpO2:  [95 %-98 %] 97 % (10/23 0440) Last BM Date: 05/13/13  Intake/Output from previous day: 10/22 0701 - 10/23 0700 In: 604.2 [Blood:604.2] Out: -  Intake/Output this shift:    PE: General--sleeping not awakened   Lab Results:  Recent Labs  05/11/13 1830 05/12/13 0605 05/13/13 0210 05/13/13 1236 05/13/13 2205  WBC 5.3 5.4 6.2 6.0 6.2  HGB 9.5* 7.9* 9.5* 10.3* 10.2*  HCT 28.7* 23.3* 28.0* 31.1* 30.4*  PLT 189 174 156 156 151   BMET  Recent Labs  05/12/13 0605 05/14/13 0545  NA 140 142  K 4.2 3.7  CL 107 106  CO2 22 24  CREATININE 1.81* 1.61*   LFT No results found for this basename: PROT, AST, ALT, ALKPHOS, BILITOT, BILIDIR, IBILI,  in the last 72 hours PT/INR  Recent Labs  05/12/13 0605 05/13/13 0210 05/14/13 0545  LABPROT 22.5* 21.6* 19.0*  INR 2.05* 1.94* 1.64*   PANCREAS No results found for this basename: LIPASE,  in the last 72 hours       Studies/Results: Dg Chest Port 1 View  05/13/2013   CLINICAL DATA:  CHF  EXAM: PORTABLE CHEST - 1 VIEW  COMPARISON:  05/11/2013  FINDINGS: Moderate cardiomegaly. Low volumes. Small bilateral pleural effusions. Vascular congestion. Stable left subclavian pacemaker device. No pneumothorax.  IMPRESSION: Stable small pleural effusions and vascular congestion.   Electronically Signed   By: Art  Hoss M.D.   On: 05/13/2013 13:22    Medications: I have reviewed the patient's current medications.  Assessment/Plan: 1. LGI Bleed. Probably hemorrhoids being treated conservatively. Would continue the miralax and see how she does with bleeding off of  anticoagulation.   Dejanee Thibeaux JR,Margarine Grosshans L 05/14/2013, 7:17 AM  Discussed with Ms York. We will perform sigmoidoscopy after enemas in the morning at 8 o'clock 

## 2013-05-18 ENCOUNTER — Encounter (HOSPITAL_COMMUNITY): Payer: Self-pay | Admitting: Gastroenterology

## 2013-05-22 ENCOUNTER — Encounter: Payer: Self-pay | Admitting: Cardiovascular Disease

## 2013-05-22 ENCOUNTER — Ambulatory Visit (INDEPENDENT_AMBULATORY_CARE_PROVIDER_SITE_OTHER): Payer: Medicare Other | Admitting: Cardiovascular Disease

## 2013-05-22 VITALS — BP 138/52 | HR 72 | Ht 66.0 in | Wt 130.8 lb

## 2013-05-22 DIAGNOSIS — I359 Nonrheumatic aortic valve disorder, unspecified: Secondary | ICD-10-CM

## 2013-05-22 DIAGNOSIS — Z950 Presence of cardiac pacemaker: Secondary | ICD-10-CM

## 2013-05-22 DIAGNOSIS — I059 Rheumatic mitral valve disease, unspecified: Secondary | ICD-10-CM

## 2013-05-22 DIAGNOSIS — I350 Nonrheumatic aortic (valve) stenosis: Secondary | ICD-10-CM

## 2013-05-22 DIAGNOSIS — I509 Heart failure, unspecified: Secondary | ICD-10-CM

## 2013-05-22 DIAGNOSIS — I5033 Acute on chronic diastolic (congestive) heart failure: Secondary | ICD-10-CM

## 2013-05-22 DIAGNOSIS — I4821 Permanent atrial fibrillation: Secondary | ICD-10-CM

## 2013-05-22 DIAGNOSIS — N183 Chronic kidney disease, stage 3 unspecified: Secondary | ICD-10-CM

## 2013-05-22 DIAGNOSIS — I4891 Unspecified atrial fibrillation: Secondary | ICD-10-CM

## 2013-05-22 DIAGNOSIS — I340 Nonrheumatic mitral (valve) insufficiency: Secondary | ICD-10-CM

## 2013-05-22 DIAGNOSIS — Z7901 Long term (current) use of anticoagulants: Secondary | ICD-10-CM

## 2013-05-22 DIAGNOSIS — I2510 Atherosclerotic heart disease of native coronary artery without angina pectoris: Secondary | ICD-10-CM

## 2013-05-22 DIAGNOSIS — Z79899 Other long term (current) drug therapy: Secondary | ICD-10-CM

## 2013-05-22 DIAGNOSIS — D62 Acute posthemorrhagic anemia: Secondary | ICD-10-CM

## 2013-05-22 MED ORDER — METOLAZONE 2.5 MG PO TABS: ORAL_TABLET | ORAL | Status: DC

## 2013-05-22 NOTE — Patient Instructions (Signed)
Your physician recommends that you schedule a follow-up appointment in: 2-3 weeks  Your physician recommends that you return for lab work in: 2 weeks  Your physician has recommended you make the following change in your medication: Start taking metolazone 2.5 mg every morning on Monday, Wednesday and Friday only; take 30 minutes before morning dose of furosemide

## 2013-05-23 NOTE — Assessment & Plan Note (Signed)
Is definitely not a candidate for surgical repair, I doubt she would be a good candidate for TAVR

## 2013-05-23 NOTE — Assessment & Plan Note (Signed)
The be related to ischemic tethering of the posterior leaflet

## 2013-05-23 NOTE — Progress Notes (Signed)
Patient ID: [name], female   DOB: May 21, 1918, 79 y.o.   MRN: [id number]      Reason for office visit Congestive heart failure followup  [name] was in our office just 2 weeks ago for routine followup and had evidence of continued heart failure/hypervolemia. The very next day she was admitted to the hospital for lower GI bleeding and her warfarin was stopped and aspirin was started. Her bleeding has also stopped. Note was made of rectal/anal stenosis. It was felt this was probably bleeding from hemorrhoids and no endoscopic procedures were performed. Note that INR was 2.8 the day before bleeding but was 3.9 on the day of admission. I'm not sure how reliable this elevated value is since the very next day her INR was down to 2.37. The lowest hemoglobin recorded was 7.7. She received a total of 3 units of packed red blood cell transfusion and 2 units of FFP although this was administered later during her hospitalization. At discharge her hemoglobin was 8.5. At hospital discharge she weighed roughly 138 pounds. Today she weighs 131 pounds. Her "dry weight" is estimated to be around 115 pounds. Although her ankle edema has diminished. She still has severe elevation jugular venous pulsations and has difficulty sleeping, which may be a sign of orthopnea. On discharge from the hospital on October 23 her proBNP was 14,000, and considerably higher than 10,000 on October 8 She has moderate to severe aortic valve stenosis and mitral valve regurgitation. Has a history of coronary disease with a remote bypass procedure more than 20 years ago. In addition she has moderate to severe chronic kidney disease with a estimated GFR of 25-30 mL per minute. Creatinine at time of hospital discharge was 1.6, which seems to be her baseline.  Allergies  Allergen Reactions  . Penicillins Swelling  . Simvastatin Other (See Comments)    Myalgia   . Ace Inhibitors Cough  . Amlodipine Besylate Rash  . Aspirin Nausea  Only, Rash and Other (See Comments)    GI distress  . Food Other (See Comments)    Berries,tomato  . Nutritional Supplements Other (See Comments)    Unknown.    Current Outpatient Prescriptions  Medication Sig Dispense Refill  . aspirin 81 MG tablet Take 1 tablet (81 mg total) by mouth daily.  30 tablet    . donepezil (ARICEPT) 5 MG tablet Take 5 mg by mouth at bedtime.      Marland Kitchen ezetimibe (ZETIA) 10 MG tablet Take 10 mg by mouth daily.        . furosemide (LASIX) 40 MG tablet Take 1.5 tablets (60 mg total) by mouth 2 (two) times daily.  30 tablet    . hydrALAZINE (APRESOLINE) 25 MG tablet Take 1 tablet (25 mg total) by mouth every 12 (twelve) hours.  60 tablet  5  . hydrocortisone (PROCTOSOL HC) 2.5 % rectal cream Place rectally 2 (two) times daily.  30 g  0  . hydrocortisone-pramoxine (PROCTOFOAM-HC) rectal foam Place 1 applicator rectally 2 (two) times daily.  10 g  0  . isosorbide dinitrate (ISORDIL) 10 MG tablet Take 1 tablet (10 mg total) by mouth 2 (two) times daily.  60 tablet  5  . LORazepam (ATIVAN) 0.5 MG tablet Take 1 tablet (0.5 mg total) by mouth every 8 (eight) hours.  30 tablet  0  . nitroGLYCERIN (NITROSTAT) 0.4 MG SL tablet Place 1 tablet (0.4 mg total) under the tongue every 5 (five) minutes as needed for chest pain.  20 tablet  0  . pantoprazole (PROTONIX) 40 MG tablet Take 40 mg by mouth daily.       . polyethylene glycol (MIRALAX / GLYCOLAX) packet Take 17 g by mouth 2 (two) times daily.  14 each  0  . potassium chloride (KLOR-CON M10) 10 MEQ tablet Take 1 tablet (10 mEq total) by mouth daily.  30 tablet  6  . metolazone (ZAROXOLYN) 2.5 MG tablet Take 1 tablet daily on Mondays, Wednesdays and Fridays only, take 30 minutes before morning dose of furosemide  45 tablet  3   No current facility-administered medications for this visit.    Past Medical History  Diagnosis Date  . CHF (congestive heart failure)     EF 30-35% on 2D Echo in July 2012  . High cholesterol   .  Atrial fibrillation     rate controlled, on chronic coumadin therapy  . Sick sinus syndrome     s/p pacemaker  . Chronic kidney disease (CKD), stage III (moderate)   . Anemia     baseline 7.8-9.0  . Coronary artery disease     stent in 2008 shows CABG to LAD, RCA and circumflex with 100% occlusion  . Heart murmur   . Mitral regurgitation     severe MR,EF 30-35%  . Ischemic cardiomyopathy   . Aortic stenosis     severe  . Renal insufficiency, mild     Past Surgical History  Procedure Laterality Date  . Exploratory laparotomy w/ bowel resection  2/12    in setting of SBO  . Pacemaker insertion  03/30/2009    [employer]  Zephyr  . R breast lumpectomy  05/2004  . Ureterolithotomy  01/2002  . Insert / replace / remove pacemaker    . Coronary artery bypass graft  1980's  . Flexible sigmoidoscopy N/A 05/15/2013    Procedure: FLEXIBLE SIGMOIDOSCOPY;  Surgeon: Vertell Novak., MD;  Location: [facility] ENDOSCOPY;  Service: Endoscopy;  Laterality: N/A;    No family history on file.  History   Social History  . Marital Status: Single    Spouse Name: N/A    Number of Children: N/A  . Years of Education: N/A   Occupational History  . Not on file.   Social History Main Topics  . Smoking status: Never Smoker   . Smokeless tobacco: Never Used  . Alcohol Use: No  . Drug Use: No  . Sexual Activity: No   Other Topics Concern  . 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 I think this is likely. She describes her edema has been less. She denies cough or wheezing. She has moderate shortness of breath with activity and is extremely sedentary. She denies any chest pain, syncope, palpitations, abdominal pain or any further lower GI bleeding. She is extremely hard of hearing. She denies new focal  neurological problems or abnormal bruising. She has not had fever or chills or urinary complaints. She has not had vaginal bleeding. She denies upper respiratory symptoms or visual changes or new focal neurological complaints. She does not have intermittent claudication.  PHYSICAL EXAM BP 138/52  Pulse 72  Ht 5\' 6"  (1.676 m)  Wt 130 lb 12.8 oz (59.33 kg)  BMI 21.12 kg/m2 General: Alert, oriented x3, no distress  Head: no evidence  of trauma, PERRL, EOMI, no exophtalmos or lid lag, no myxedema, no xanthelasma; normal ears, nose and oropharynx  Neck:  jugular venous pulsations are elevated to the earlobes bilaterally without any additional possible hepatojugular reflux; brisk carotid pulses without delay, with bilateral carotid bruits probably radiate from the chest  Chest: clear to auscultation, no signs of consolidation by percussion or palpation, normal fremitus, symmetrical and full respiratory excursions  Cardiovascular: Laterally displaced apical impulse, regular rhythm, normal first and second heart sounds, no rubs or gallops, grade 3/6 apical holosystolic murmur, grade 2-3/6 early to mid peaking aortic ejection murmur  Abdomen: no tenderness or distention, no masses by palpation, no abnormal pulsatility or arterial bruits, normal bowel sounds, no hepatosplenomegaly  Extremities: no clubbing, cyanosis; 2-3+ bilateral (R>L) pitting edema of the calves, to the level of her knees; 2+ radial, ulnar and brachial pulses bilaterally; 2+ right femoral, posterior tibial and dorsalis pedis pulses; 2+ left femoral, posterior tibial and dorsalis pedis pulses; no subclavian or femoral bruits  Neurological: grossly nonfocal, very hard of hearing   EKG: Atrial fibrillation  Lipid Panel     Component Value Date/Time   CHOL  Value: 115        ATP III CLASSIFICATION:  <200     mg/dL   Desirable  409-811  mg/dL   Borderline High  >=914    mg/dL   High        01/28/2955 0540   TRIG 56 03/29/2009 0540   HDL 54  03/29/2009 0540   CHOLHDL 2.1 03/29/2009 0540   VLDL 11 03/29/2009 0540   LDLCALC  Value: 50        Total Cholesterol/HDL:CHD Risk Coronary Heart Disease Risk Table                     Men   Women  1/2 Average Risk   3.4   3.3  Average Risk       5.0   4.4  2 X Average Risk   9.6   7.1  3 X Average Risk  23.4   11.0        Use the calculated Patient Ratio above and the CHD Risk Table to determine the patient's CHD Risk.        ATP III CLASSIFICATION (LDL):  <100     mg/dL   Optimal  213-086  mg/dL   Near or Above                    Optimal  130-159  mg/dL   Borderline  578-469  mg/dL   High  >629     mg/dL   Very High 11/22/8411 2440    BMET    Component Value Date/Time   NA 142 05/14/2013 0545   K 3.7 05/14/2013 0545   CL 106 05/14/2013 0545   CO2 24 05/14/2013 0545   GLUCOSE 91 05/14/2013 0545   BUN 33* 05/14/2013 0545   CREATININE 1.61* 05/14/2013 0545   CREATININE 1.61* 03/24/2013 1524   CALCIUM 8.8 05/14/2013 0545   GFRNONAA 26* 05/14/2013 0545   GFRAA 31* 05/14/2013 0545     ASSESSMENT AND PLAN Coronary artery disease - S/P remote CABG x 4 (1980's) She had bypass surgery in 1983. Catheterization in 2008 showed that all native coronary arteries were occluded and she is graft dependent. The saphenous vein graft to the right coronary artery was also occluded. Stress echo performed in the last 3 years did not show  evidence of ischemia.. The development of mitral insufficiency is probably a sign of loss of at least one of the pass the limbs to the oblique marginal vessels with ischemic MR. Unfortunately, she is by no means a candidate for revascularization with either surgical or percutaneous methods, so coronary angiography would really not serve to change her therapy.  Mitral insufficiency The be related to ischemic tethering of the posterior leaflet  Atrial fibrillation, permanent Actually, this represents her second episode of bleeding requiring transfusion within the last 2-3 years.  Warfarin will not be resumed. Her pacemaker has been reprogrammed to minimize ventricular pacing in the setting of slow ventricular response. Right ventricular pacing may be contributing to worsening heart failure.  Aortic stenosis- moderate to moderate/ severe 04/11/13 Is definitely not a candidate for surgical repair, I doubt she would be a good candidate for TAVR  Pacemaker - [employer], dual ch. programmed VVIR Programming changes have resulted in a substantial reduction in ventricular pacing frequency (previously greater than 75%, now 13%)  Acute on chronic diastolic congestive heart failure Most recent EF estimation was 45-50%. She is known to have an inferior scar. -- Angiotensin inhibition is limited by her renal insufficiency (estimated GFR 25-30). She is on hydralazine nitrates. She is still clearly hypervolemic despite a moderately high dose of furosemide. Will add metolazone 2.5 mg daily on 3 days of the week (Monday Wednesday Friday). Recheck bmet in 2 weeks with a followup visit in the office in 2-3 weeks.  Chronic renal insufficiency, stage III (moderate)     Orders Placed This Encounter  Procedures  . Basic Metabolic Panel (BMET)  . CBC   Meds ordered this encounter  Medications  . metolazone (ZAROXOLYN) 2.5 MG tablet    Sig: Take 1 tablet daily on Mondays, Wednesdays and Fridays only, take 30 minutes before morning dose of furosemide    Dispense:  45 tablet    Refill:  3    Charmayne Odell  Thurmon Fair, MD, [facility] [phone] office [phone] pager

## 2013-05-23 NOTE — Assessment & Plan Note (Deleted)
Actually, this represents her second episode of bleeding requiring transfusion within the last 2-3 years. Warfarin will not be resumed.

## 2013-05-23 NOTE — Assessment & Plan Note (Signed)
Programming changes have resulted in a substantial reduction in ventricular pacing frequency (previously greater than 75%, now 13%)

## 2013-05-23 NOTE — Assessment & Plan Note (Signed)
She had bypass surgery in 1983. Catheterization in 2008 showed that all native coronary arteries were occluded and she is graft dependent. The saphenous vein graft to the right coronary artery was also occluded. Stress echo performed in the last 3 years did not show evidence of ischemia.. The development of mitral insufficiency is probably a sign of loss of at least one of the pass the limbs to the oblique marginal vessels with ischemic MR. Unfortunately, she is by no means a candidate for revascularization with either surgical or percutaneous methods, so coronary angiography would really not serve to change her therapy.

## 2013-05-23 NOTE — Assessment & Plan Note (Signed)
Actually, this represents her second episode of bleeding requiring transfusion within the last 2-3 years. Warfarin will not be resumed. Her pacemaker has been reprogrammed to minimize ventricular pacing in the setting of slow ventricular response. Right ventricular pacing may be contributing to worsening heart failure.

## 2013-05-23 NOTE — Assessment & Plan Note (Addendum)
Most recent EF estimation was 45-50%. She is known to have an inferior scar. -- Angiotensin inhibition is limited by her renal insufficiency (estimated GFR 25-30). She is on hydralazine nitrates. She is still clearly hypervolemic despite a moderately high dose of furosemide. Will add metolazone 2.5 mg daily on 3 days of the week (Monday Wednesday Friday). Recheck bmet in 2 weeks with a followup visit in the office in 2-3 weeks.

## 2013-06-05 ENCOUNTER — Ambulatory Visit: Payer: Medicare Other | Admitting: Cardiovascular Disease

## 2013-06-29 ENCOUNTER — Other Ambulatory Visit: Payer: Self-pay | Admitting: Cardiovascular Disease

## 2013-06-29 NOTE — Telephone Encounter (Signed)
Please advise 

## 2013-08-20 ENCOUNTER — Other Ambulatory Visit: Payer: Self-pay | Admitting: Cardiovascular Disease

## 2013-08-27 ENCOUNTER — Encounter: Payer: Self-pay | Admitting: Cardiovascular Disease

## 2013-08-27 ENCOUNTER — Ambulatory Visit (INDEPENDENT_AMBULATORY_CARE_PROVIDER_SITE_OTHER): Payer: Medicare Other | Admitting: Cardiovascular Disease

## 2013-08-27 ENCOUNTER — Telehealth: Payer: Self-pay | Admitting: Cardiovascular Disease

## 2013-08-27 VITALS — BP 102/60 | HR 66 | Resp 20 | Ht 66.0 in | Wt 137.6 lb

## 2013-08-27 DIAGNOSIS — I509 Heart failure, unspecified: Secondary | ICD-10-CM

## 2013-08-27 DIAGNOSIS — Z79899 Other long term (current) drug therapy: Secondary | ICD-10-CM

## 2013-08-27 DIAGNOSIS — I4821 Permanent atrial fibrillation: Secondary | ICD-10-CM

## 2013-08-27 DIAGNOSIS — Z950 Presence of cardiac pacemaker: Secondary | ICD-10-CM

## 2013-08-27 DIAGNOSIS — I495 Sick sinus syndrome: Secondary | ICD-10-CM

## 2013-08-27 DIAGNOSIS — I4891 Unspecified atrial fibrillation: Secondary | ICD-10-CM

## 2013-08-27 LAB — MDC_IDC_ENUM_SESS_TYPE_INCLINIC
Date Time Interrogation Session: 20150205141640
Implantable Pulse Generator Model: 5826
Lead Channel Impedance Value: 532 Ohm
Lead Channel Pacing Threshold Amplitude: 0.75 V
Lead Channel Pacing Threshold Pulse Width: 0.4 ms
Lead Channel Sensing Intrinsic Amplitude: 7.8 mV
MDC IDC MSMT BATTERY IMPEDANCE: 1000 Ohm — AB
MDC IDC MSMT BATTERY VOLTAGE: 2.79 V
MDC IDC PG SERIAL: 2164647
MDC IDC SET LEADCHNL RV PACING AMPLITUDE: 2.25 V
MDC IDC SET LEADCHNL RV PACING PULSEWIDTH: 0.4 ms
MDC IDC SET LEADCHNL RV SENSING SENSITIVITY: 2 mV
MDC IDC STAT BRADY RV PERCENT PACED: 21 %

## 2013-08-27 LAB — PACEMAKER DEVICE OBSERVATION

## 2013-08-27 MED ORDER — POTASSIUM CHLORIDE CRYS ER 10 MEQ PO TBCR: 20.0000 meq | EXTENDED_RELEASE_TABLET | Freq: Every day | ORAL | Status: DC

## 2013-08-27 NOTE — Patient Instructions (Signed)
Referral made to [facility] for management of congestive heart failure.  RESTART Metolazone on Mondays/Wednesdays/Fridays.  Take 30 minutes before Furosemide.  Increase Potassium to 2 tablets a day.  BMP (lab work) to be drawn in one week.  Weigh daily and record.  Please call with weights starting next week.  Your physician recommends that you schedule a follow-up appointment in: 2 weeks with a PA/NP.

## 2013-08-27 NOTE — Telephone Encounter (Signed)
Faxed referral to [facility] for this patient.

## 2013-08-28 ENCOUNTER — Telehealth: Payer: Self-pay | Admitting: Internal Medicine

## 2013-08-28 NOTE — Telephone Encounter (Signed)
Returned call and pt verified x 2 w/ Clydie Braun, pt's niece.  Stated pt was supposed to get a prescription sent in for metolazone, but it wasn't there.  Reviewed chart.  Rx for metolazone sent in Oct. 2014 for 90-day w/ refills.  Informed RN will call pharmacy.  Call to pharmacy and informed Rx is available to fill.  Will fill now.    Call to Clydie Braun and informed.  Verbalized understanding.

## 2013-08-28 NOTE — Telephone Encounter (Signed)
Saw Dr Rennis Golden yesterday,he said he wanted her to take [facility]. When she went to the pharmacy it had not been called in.

## 2013-09-05 ENCOUNTER — Encounter: Payer: Self-pay | Admitting: Cardiovascular Disease

## 2013-09-05 NOTE — Progress Notes (Signed)
Patient ID: ELPIDIA GRAVELINE, female   DOB: 09/19/1917, 80 y.o.   MRN: [id number]      Reason for office visit Congestive heart failure, mitral regurgitation and aortic stenosis, coronary artery disease   Mrs. Corman continues to gradually deteriorate. She is less energetic than before and spends a large part of the day sleeping. She has orthopnea. She has had a couple of falls tripping over her oxygen tubing. She does not complain of shortness of breath but appears to have multiple manifestations of low cardiac output. Her edema has waxed and waned over the last one to 2 weeks and her family is adjusting her diuretics accordingly. Metolazone had been stopped but I think we'll need to resume it.  We have relaxed her ventricular rate control medications and reduced her lower rate pacing limit in an effort to prevent right ventricular apical pacing. She is now only pacing the ventricle about 21% of the time. The benefit from those interventions appears to be very temporary. Her underlying rhythm is atrial fibrillation with ventricular rates that vary from the 40s to the 80s. Warfarin has been discontinued due to gastrointestinal bleeding.  She has moderate to severe aortic valve stenosis and mitral valve regurgitation. Has a history of coronary disease with a remote bypass procedure more than 20 years ago. In addition she has moderate to severe chronic kidney disease with a estimated GFR of 25-30 mL per minute (Creatinine 1.6, seems to be her baseline).    Allergies  Allergen Reactions  . Penicillins Swelling  . Simvastatin Other (See Comments)    Myalgia   . Ace Inhibitors Cough  . Amlodipine Besylate Rash  . Aspirin Nausea Only, Rash and Other (See Comments)    GI distress  . Food Other (See Comments)    Berries,tomato  . Nutritional Supplements Other (See Comments)    Unknown.    Current Outpatient Prescriptions  Medication Sig Dispense Refill  . aspirin 81 MG tablet Take 1 tablet  (81 mg total) by mouth daily.  30 tablet    . donepezil (ARICEPT) 5 MG tablet Take 5 mg by mouth at bedtime.      Marland Kitchen ezetimibe (ZETIA) 10 MG tablet Take 10 mg by mouth daily.        . furosemide (LASIX) 40 MG tablet Take 1.5 tablets (60 mg total) by mouth 2 (two) times daily.  30 tablet    . hydrALAZINE (APRESOLINE) 25 MG tablet Take 1 tablet (25 mg total) by mouth every 12 (twelve) hours.  60 tablet  5  . hydrocortisone (PROCTOSOL HC) 2.5 % rectal cream Place rectally 2 (two) times daily.  30 g  0  . hydrocortisone-pramoxine (PROCTOFOAM-HC) rectal foam Place 1 applicator rectally 2 (two) times daily.  10 g  0  . isosorbide dinitrate (ISORDIL) 10 MG tablet Take 1 tablet (10 mg total) by mouth 2 (two) times daily.  60 tablet  5  . LORazepam (ATIVAN) 0.5 MG tablet qhs      . metolazone (ZAROXOLYN) 2.5 MG tablet Take 1 tablet daily on Mondays, Wednesdays and Fridays only, take 30 minutes before morning dose of furosemide  45 tablet  3  . nitroGLYCERIN (NITROSTAT) 0.4 MG SL tablet Place 1 tablet (0.4 mg total) under the tongue every 5 (five) minutes as needed for chest pain.  20 tablet  0  . pantoprazole (PROTONIX) 40 MG tablet Take 40 mg by mouth daily.       . polyethylene glycol (MIRALAX / GLYCOLAX) packet  Take 17 g by mouth 2 (two) times daily as needed.      . potassium chloride (KLOR-CON M10) 10 MEQ tablet Take 2 tablets (20 mEq total) by mouth daily.  60 tablet  6  . carvedilol (COREG) 3.125 MG tablet Take 3.125 mg by mouth 2 (two) times daily.       No current facility-administered medications for this visit.    Past Medical History  Diagnosis Date  . CHF (congestive heart failure)     EF 30-35% on 2D Echo in July 2012  . High cholesterol   . Atrial fibrillation     rate controlled, on chronic coumadin therapy  . Sick sinus syndrome     s/p pacemaker  . Chronic kidney disease (CKD), stage III (moderate)   . Anemia     baseline 7.8-9.0  . Coronary artery disease     stent in 2008  shows CABG to LAD, RCA and circumflex with 100% occlusion  . Heart murmur   . Mitral regurgitation     severe MR,EF 30-35%  . Ischemic cardiomyopathy   . Aortic stenosis     severe  . Renal insufficiency, mild     Past Surgical History  Procedure Laterality Date  . Exploratory laparotomy w/ bowel resection  2/12    in setting of SBO  . Pacemaker insertion  03/30/2009    [employer]  Zephyr  . R breast lumpectomy  05/2004  . Ureterolithotomy  01/2002  . Insert / replace / remove pacemaker    . Coronary artery bypass graft  1980's  . Flexible sigmoidoscopy N/A 05/15/2013    Procedure: FLEXIBLE SIGMOIDOSCOPY;  Surgeon: Vertell Novak., MD;  Location: [facility] END[facility]COPY;  Service: Endoscopy;  Laterality: N/A;    No family history on file.  History   Social History  . Marital Status: Single    Spouse Name: N/A    Number of Children: N/A  . Years of Education: N/A   Occupational History  . Not on file.   Social History Main Topics  . Smoking status: Never Smoker   . Smokeless tobacco: Never Used  . Alcohol Use: No  . Drug Use: No  . Sexual Activity: No   Other Topics Concern  . 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 I think this is likely. She describes her edema has been less. She denies cough or wheezing. She has moderate shortness of breath with activity and is extremely sedentary. She denies any chest pain, syncope, palpitations, abdominal pain or any further lower GI bleeding. She is extremely hard of hearing. She denies new focal neurological problems or abnormal bruising. She has not had fever or chills or urinary complaints. She has not had vaginal bleeding. She denies upper respiratory symptoms or visual changes or new focal neurological complaints. She does not have intermittent  claudication.   PHYSICAL EXAM BP 102/60  Pulse 66  Resp 20  Ht 5\' 6"  (1.676 m)  Wt 62.415 kg (137 lb 9.6 oz)  BMI 22.22 kg/m2 General: Alert, oriented x3, no distress  Head: no evidence of trauma, PERRL, EOMI, no exophtalmos or lid lag, no myxedema, no xanthelasma; normal ears, nose and oropharynx  Neck: jugular venous pulsations are elevated to the earlobes bilaterally without any additional possible hepatojugular reflux;  brisk carotid pulses without delay, with bilateral carotid bruits probably radiate from the chest  Chest: clear to auscultation, no signs of consolidation by percussion or palpation, normal fremitus, symmetrical and full respiratory excursions  Cardiovascular: Laterally displaced apical impulse, regular rhythm, normal first and second heart sounds, no rubs or gallops, grade 3/6 apical holosystolic murmur, grade 2-3/6 early to mid peaking aortic ejection murmur  Abdomen: no tenderness or distention, no masses by palpation, no abnormal pulsatility or arterial bruits, normal bowel sounds, no hepatosplenomegaly  Extremities: no clubbing, cyanosis; 2-3+ bilateral (R>L) pitting edema of the calves, to the level of her knees; 2+ radial, ulnar and brachial pulses bilaterally; 2+ right femoral, posterior tibial and dorsalis pedis pulses; 2+ left femoral, posterior tibial and dorsalis pedis pulses; no subclavian or femoral bruits  Neurological: grossly nonfocal, very hard of hearing   EKG: Atrial fibrillation, nonspecific IVCD with a QRS of 120 ms, slightly worsened inferior ST segment depression with T wave inversion also seen in V3 V6  Lipid Panel     Component Value Date/Time   CHOL  Value: 115        ATP III CLASSIFICATION:  <200     mg/dL   Desirable  161-096  mg/dL   Borderline High  >=045    mg/dL   High        4/0/9811 0540   TRIG 56 03/29/2009 0540   HDL 54 03/29/2009 0540   CHOLHDL 2.1 03/29/2009 0540   VLDL 11 03/29/2009 0540   LDLCALC  Value: 50        Total  Cholesterol/HDL:CHD Risk Coronary Heart Disease Risk Table                     Men   Women  1/2 Average Risk   3.4   3.3  Average Risk       5.0   4.4  2 X Average Risk   9.6   7.1  3 X Average Risk  23.4   11.0        Use the calculated Patient Ratio above and the CHD Risk Table to determine the patient's CHD Risk.        ATP III CLASSIFICATION (LDL):  <100     mg/dL   Optimal  914-782  mg/dL   Near or Above                    Optimal  130-159  mg/dL   Borderline  956-213  mg/dL   High  >086     mg/dL   Very High 11/27/8467 6295    BMET    Component Value Date/Time   NA 142 05/14/2013 0545   K 3.7 05/14/2013 0545   CL 106 05/14/2013 0545   CO2 24 05/14/2013 0545   GLUCOSE 91 05/14/2013 0545   BUN 33* 05/14/2013 0545   CREATININE 1.61* 05/14/2013 0545   CREATININE 1.61* 03/24/2013 1524   CALCIUM 8.8 05/14/2013 0545   GFRNONAA 26* 05/14/2013 0545   GFRAA 31* 05/14/2013 0545     ASSESSMENT AND PLAN  Think we are now reaching the limits of medical therapy for Mrs. Rubert is cardiac problems. The combination of moderate to severe aortic stenosis and moderate to severe mitral insufficiency.  She has limited stroke volume due to her valvular problems, she would theoretically benefit from a higher heart rate, but this can only be achieved at the expense of increased ventricular pacing. By previous echocardiography it  was apparent that her left ventricular function and the degree of mitral insufficiency substantially deteriorated when she paces the ventricle.  We'll increase her diuretics. She'll start taking metolazone 3 days a week. She will require labs to be drawn. She looks extremely weak and will need to engage home health assistance, they can also help with lab draws. We'll have to see her back in the clinic fairly promptly.  I also think it is time to further of the discussion regarding end-of-life care. Have recommended a palliative care consult and hospice evaluation.   Orders Placed  This Encounter  Procedures  . Basic Metabolic Panel (BMET)  . Ambulatory referral to Home Health  . Implantable device check  . EKG 12-Lead   Patient Instructions  Referral made to [facility] for management of congestive heart failure.  RESTART Metolazone on Mondays/Wednesdays/Fridays.  Take 30 minutes before Furosemide.  Increase Potassium to 2 tablets a day.  BMP (lab work) to be drawn in one week.  Weigh daily and record.  Please call with weights starting next week.  Your physician recommends that you schedule a follow-up appointment in: 2 weeks with a PA/NP.      Meds ordered this encounter  Medications  . polyethylene glycol (MIRALAX / GLYCOLAX) packet    Sig: Take 17 g by mouth 2 (two) times daily as needed.  Marland Kitchen. LORazepam (ATIVAN) 0.5 MG tablet    Sig: qhs  . carvedilol (COREG) 3.125 MG tablet    Sig: Take 3.125 mg by mouth 2 (two) times daily.  . potassium chloride (KLOR-CON M10) 10 MEQ tablet    Sig: Take 2 tablets (20 mEq total) by mouth daily.    Dispense:  60 tablet    Refill:  6    Umaima Scholten  Thurmon FairMihai Jefferey Lippmann, MD, [facility] [phone] office [phone] pager

## 2013-09-10 ENCOUNTER — Ambulatory Visit: Payer: Medicare Other | Admitting: Physician Assistant

## 2013-09-16 ENCOUNTER — Ambulatory Visit: Payer: Medicare Other | Admitting: Physician Assistant

## 2013-09-18 ENCOUNTER — Other Ambulatory Visit: Payer: Self-pay | Admitting: Cardiovascular Disease

## 2013-09-18 MED ORDER — LORAZEPAM 0.5 MG PO TABS: ORAL_TABLET | ORAL | Status: DC

## 2013-09-18 NOTE — Addendum Note (Signed)
Addended by: Neta Ehlers on: 09/18/2013 01:16 PM   Modules accepted: Orders

## 2013-09-18 NOTE — Telephone Encounter (Signed)
Rx for Zetia sent into pharmacy electronically.  Rx for Ativan routed to Dr Royann Shivers for approval - Dr C approved medication - Called rx into pharmacy

## 2013-09-22 ENCOUNTER — Inpatient Hospital Stay (HOSPITAL_COMMUNITY)
Admission: AD | Admit: 2013-09-22 | Discharge: 2013-09-30 | DRG: 292 | Disposition: A | Discharge status: Hospice | Payer: Medicare Other | Source: Ambulatory Visit | Attending: Cardiovascular Disease | Admitting: Cardiovascular Disease

## 2013-09-22 ENCOUNTER — Encounter: Payer: Self-pay | Admitting: Physician Assistant

## 2013-09-22 ENCOUNTER — Encounter (HOSPITAL_COMMUNITY): Payer: Self-pay | Admitting: General Practice

## 2013-09-22 ENCOUNTER — Ambulatory Visit (INDEPENDENT_AMBULATORY_CARE_PROVIDER_SITE_OTHER): Payer: Medicare Other | Admitting: Physician Assistant

## 2013-09-22 VITALS — BP 100/60 | HR 62 | Ht 67.0 in | Wt 139.0 lb

## 2013-09-22 DIAGNOSIS — I359 Nonrheumatic aortic valve disorder, unspecified: Secondary | ICD-10-CM

## 2013-09-22 DIAGNOSIS — I509 Heart failure, unspecified: Secondary | ICD-10-CM

## 2013-09-22 DIAGNOSIS — D649 Anemia, unspecified: Secondary | ICD-10-CM

## 2013-09-22 DIAGNOSIS — I4891 Unspecified atrial fibrillation: Secondary | ICD-10-CM | POA: Diagnosis present

## 2013-09-22 DIAGNOSIS — N183 Chronic kidney disease, stage 3 unspecified: Secondary | ICD-10-CM

## 2013-09-22 DIAGNOSIS — Z951 Presence of aortocoronary bypass graft: Secondary | ICD-10-CM

## 2013-09-22 DIAGNOSIS — I5032 Chronic diastolic (congestive) heart failure: Secondary | ICD-10-CM

## 2013-09-22 DIAGNOSIS — I2510 Atherosclerotic heart disease of native coronary artery without angina pectoris: Secondary | ICD-10-CM

## 2013-09-22 DIAGNOSIS — H9190 Unspecified hearing loss, unspecified ear: Secondary | ICD-10-CM

## 2013-09-22 DIAGNOSIS — I5033 Acute on chronic diastolic (congestive) heart failure: Secondary | ICD-10-CM

## 2013-09-22 DIAGNOSIS — Z515 Encounter for palliative care: Secondary | ICD-10-CM

## 2013-09-22 DIAGNOSIS — I4729 Other ventricular tachycardia: Secondary | ICD-10-CM

## 2013-09-22 DIAGNOSIS — I350 Nonrheumatic aortic (valve) stenosis: Secondary | ICD-10-CM

## 2013-09-22 DIAGNOSIS — I4821 Permanent atrial fibrillation: Secondary | ICD-10-CM

## 2013-09-22 DIAGNOSIS — N2889 Other specified disorders of kidney and ureter: Secondary | ICD-10-CM

## 2013-09-22 DIAGNOSIS — Z7982 Long term (current) use of aspirin: Secondary | ICD-10-CM

## 2013-09-22 DIAGNOSIS — E7800 Pure hypercholesterolemia, unspecified: Secondary | ICD-10-CM | POA: Diagnosis present

## 2013-09-22 DIAGNOSIS — I059 Rheumatic mitral valve disease, unspecified: Secondary | ICD-10-CM

## 2013-09-22 DIAGNOSIS — Z79899 Other long term (current) drug therapy: Secondary | ICD-10-CM

## 2013-09-22 DIAGNOSIS — I472 Ventricular tachycardia, unspecified: Secondary | ICD-10-CM

## 2013-09-22 DIAGNOSIS — I340 Nonrheumatic mitral (valve) insufficiency: Secondary | ICD-10-CM

## 2013-09-22 DIAGNOSIS — N184 Chronic kidney disease, stage 4 (severe): Secondary | ICD-10-CM | POA: Diagnosis present

## 2013-09-22 DIAGNOSIS — I080 Rheumatic disorders of both mitral and aortic valves: Secondary | ICD-10-CM | POA: Diagnosis present

## 2013-09-22 DIAGNOSIS — I2589 Other forms of chronic ischemic heart disease: Secondary | ICD-10-CM | POA: Diagnosis present

## 2013-09-22 DIAGNOSIS — Z66 Do not resuscitate: Secondary | ICD-10-CM | POA: Diagnosis not present

## 2013-09-22 DIAGNOSIS — Z7901 Long term (current) use of anticoagulants: Secondary | ICD-10-CM

## 2013-09-22 DIAGNOSIS — R0600 Dyspnea, unspecified: Secondary | ICD-10-CM

## 2013-09-22 DIAGNOSIS — I959 Hypotension, unspecified: Secondary | ICD-10-CM | POA: Diagnosis not present

## 2013-09-22 DIAGNOSIS — Z950 Presence of cardiac pacemaker: Secondary | ICD-10-CM

## 2013-09-22 DIAGNOSIS — I5043 Acute on chronic combined systolic (congestive) and diastolic (congestive) heart failure: Secondary | ICD-10-CM

## 2013-09-22 HISTORY — DX: Shortness of breath: R06.02

## 2013-09-22 LAB — BASIC METABOLIC PANEL
BUN: 91 mg/dL — AB (ref 6–23)
CHLORIDE: 101 meq/L (ref 96–112)
CO2: 23 mEq/L (ref 19–32)
Calcium: 9.1 mg/dL (ref 8.4–10.5)
Creatinine, Ser: 3.17 mg/dL — ABNORMAL HIGH (ref 0.50–1.10)
GFR calc Af Amer: 14 mL/min — ABNORMAL LOW (ref >90)
GFR calc non Af Amer: 12 mL/min — ABNORMAL LOW (ref >90)
GLUCOSE: 113 mg/dL — AB (ref 70–99)
POTASSIUM: 4.6 meq/L (ref 3.7–5.3)
Sodium: 142 mEq/L (ref 137–147)

## 2013-09-22 LAB — PRO B NATRIURETIC PEPTIDE: Pro B Natriuretic peptide (BNP): 27527 pg/mL — ABNORMAL HIGH (ref 0–450)

## 2013-09-22 MED ORDER — HYDROCORTISONE 2.5 % RE CREA
TOPICAL_CREAM | Freq: Two times a day (BID) | RECTAL | Status: DC
  Administered 2013-09-22 – 2013-09-30 (×15): via RECTAL
  Filled 2013-09-22: qty 28.35

## 2013-09-22 MED ORDER — DONEPEZIL HCL 5 MG PO TABS
5.0000 mg | ORAL_TABLET | Freq: Every day | ORAL | Status: DC
  Administered 2013-09-22 – 2013-09-29 (×8): 5 mg via ORAL
  Filled 2013-09-22 (×9): qty 1

## 2013-09-22 MED ORDER — HYDRALAZINE HCL 25 MG PO TABS
25.0000 mg | ORAL_TABLET | Freq: Two times a day (BID) | ORAL | Status: DC
  Administered 2013-09-22 – 2013-09-23 (×3): 25 mg via ORAL
  Filled 2013-09-22 (×6): qty 1

## 2013-09-22 MED ORDER — FUROSEMIDE 10 MG/ML IJ SOLN
80.0000 mg | Freq: Two times a day (BID) | INTRAMUSCULAR | Status: DC
  Administered 2013-09-22 – 2013-09-28 (×10): 80 mg via INTRAVENOUS
  Filled 2013-09-22 (×20): qty 8

## 2013-09-22 MED ORDER — SODIUM CHLORIDE 0.9 % IJ SOLN
3.0000 mL | INTRAMUSCULAR | Status: DC | PRN
Start: 2013-09-22 — End: 2013-09-30

## 2013-09-22 MED ORDER — HYDROCORTISONE ACE-PRAMOXINE 1-1 % RE FOAM
1.0000 | Freq: Two times a day (BID) | RECTAL | Status: DC
  Administered 2013-09-22 – 2013-09-27 (×10): 1 via RECTAL
  Filled 2013-09-22 (×2): qty 10

## 2013-09-22 MED ORDER — SODIUM CHLORIDE 0.9 % IV SOLN: 250.0000 mL | INTRAVENOUS | Status: DC | PRN

## 2013-09-22 MED ORDER — POLYETHYLENE GLYCOL 3350 17 G PO PACK
17.0000 g | PACK | Freq: Two times a day (BID) | ORAL | Status: DC | PRN
  Filled 2013-09-22: qty 1

## 2013-09-22 MED ORDER — CARVEDILOL 3.125 MG PO TABS
3.1250 mg | ORAL_TABLET | Freq: Two times a day (BID) | ORAL | Status: DC
  Administered 2013-09-22 – 2013-09-30 (×11): 3.125 mg via ORAL
  Filled 2013-09-22 (×18): qty 1

## 2013-09-22 MED ORDER — POTASSIUM CHLORIDE CRYS ER 20 MEQ PO TBCR
20.0000 meq | EXTENDED_RELEASE_TABLET | Freq: Two times a day (BID) | ORAL | Status: DC
  Administered 2013-09-22 – 2013-09-30 (×16): 20 meq via ORAL
  Filled 2013-09-22 (×18): qty 1

## 2013-09-22 MED ORDER — PANTOPRAZOLE SODIUM 40 MG PO TBEC
40.0000 mg | DELAYED_RELEASE_TABLET | Freq: Every day | ORAL | Status: DC
  Administered 2013-09-23 – 2013-09-30 (×8): 40 mg via ORAL
  Filled 2013-09-22 (×5): qty 1

## 2013-09-22 MED ORDER — ISOSORBIDE DINITRATE 10 MG PO TABS
10.0000 mg | ORAL_TABLET | Freq: Two times a day (BID) | ORAL | Status: DC
  Administered 2013-09-22 – 2013-09-30 (×14): 10 mg via ORAL
  Filled 2013-09-22 (×18): qty 1

## 2013-09-22 MED ORDER — SODIUM CHLORIDE 0.9 % IJ SOLN
3.0000 mL | Freq: Two times a day (BID) | INTRAMUSCULAR | Status: DC
  Administered 2013-09-22 – 2013-09-30 (×16): 3 mL via INTRAVENOUS

## 2013-09-22 NOTE — Progress Notes (Signed)
Utilization Review Completed.Cody Oliger T3/09/2013  

## 2013-09-22 NOTE — Assessment & Plan Note (Signed)
The patient will be admitted for IV diuresis.  Telemetry floor.  Will check basic labs: CBC and basic metabolic panel.  We'll also consult hospice and palliative medicine.  Strict in/outs.  Daily weights.  Supplemental potassium.

## 2013-09-22 NOTE — Progress Notes (Signed)
Date:  09/22/2013   ID:  [name], DOB 10/06/1917, MRN [id number]  PCP:  [name]  Primary Cardiologist:  Croitoru     History of Present Illness: [name] is a 80 y.o. female [name] continues to gradually deteriorate. She is less energetic than before and spends a large part of the day sleeping. She has orthopnea. She has had a couple of falls tripping over her oxygen tubing. She does not complain of shortness of breath but appears to have multiple manifestations of low cardiac output. Her edema has waxed and waned over the last one to 2 weeks and her family is adjusting her diuretics accordingly. Metolazone was restarted in the beginning of February when it was a Friday.  Her last visit Dr. Royann Shiversroitoru relaxed her ventricular rate control medications and reduced her lower rate pacing limit in an effort to prevent right ventricular apical pacing. She is now only pacing the ventricle about 21% of the time. The benefit from those interventions appears to be very temporary. Her underlying rhythm is atrial fibrillation with ventricular rates that vary from the 40s to the 80s. Warfarin has been discontinued due to gastrointestinal bleeding.   She has moderate to severe aortic valve stenosis and mitral valve regurgitation. Has a history of coronary disease with a remote bypass procedure more than 20 years ago. In addition she has moderate to severe chronic kidney disease with a estimated GFR of 25-30 mL per minute (Creatinine 1.6, seems to be her baseline).   She presents today for followup evaluation home hospice and home health nurse saw the Mathis. She has had actual 2 pounds weight gain his weight loss. Continues to have severe lower extremity edema, JVD, abdominal distention and orthopnea.  While here in the clinic she is on oxygen therapy is sitting comfortably on the table.  She sleeps in a hospital bed at home which is elevated.  The Mathis currently denies nausea,  vomiting, fever, chest pain,  dizziness, cough, congestion, abdominal pain, hematochezia, melena.  Wt Readings from Last 3 Encounters:  09/22/13 139 lb (63.05 kg)  08/27/13 137 lb 9.6 oz (62.415 kg)  05/22/13 130 lb 12.8 oz (59.33 kg)     Past Medical History  Diagnosis Date  . CHF (congestive heart failure)     EF 30-35% on 2D Echo in July 2012  . High cholesterol   . Atrial fibrillation     rate controlled, on chronic coumadin therapy  . Sick sinus syndrome     s/p pacemaker  . Chronic kidney disease (CKD), stage III (moderate)   . Anemia     baseline 7.8-9.0  . Coronary artery disease     stent in 2008 shows CABG to LAD, RCA and circumflex with 100% occlusion  . Heart murmur   . Mitral regurgitation     severe MR,EF 30-35%  . Ischemic cardiomyopathy   . Aortic stenosis     severe  . Renal insufficiency, mild     Current Outpatient Prescriptions  Medication Sig Dispense Refill  . aspirin 81 MG tablet Take 1 tablet (81 mg total) by mouth daily.  30 tablet    . carvedilol (COREG) 3.125 MG tablet Take 3.125 mg by mouth 2 (two) times daily.      Marland Kitchen. donepezil (ARICEPT) 5 MG tablet Take 5 mg by mouth at bedtime.      . furosemide (LASIX) 40 MG tablet Take 1.5 tablets (60 mg total) by mouth 2 (  two) times daily.  30 tablet    . hydrALAZINE (APRESOLINE) 25 MG tablet Take 1 tablet (25 mg total) by mouth every 12 (twelve) hours.  60 tablet  5  . hydrocortisone (PROCTOSOL HC) 2.5 % rectal cream Place rectally 2 (two) times daily.  30 g  0  . hydrocortisone-pramoxine (PROCTOFOAM-HC) rectal foam Place 1 applicator rectally 2 (two) times daily.  10 g  0  . isosorbide dinitrate (ISORDIL) 10 MG tablet Take 1 tablet (10 mg total) by mouth 2 (two) times daily.  60 tablet  5  . LORazepam (ATIVAN) 0.5 MG tablet TAKE 1 TABLET BY MOUTH EVERY 8 HOURS AS DIRECTED  30 tablet  0  . metolazone (ZAROXOLYN) 2.5 MG tablet Take 1 tablet daily on Mondays, Wednesdays and Fridays only, take 30 minutes before  morning dose of furosemide  45 tablet  3  . nitroGLYCERIN (NITROSTAT) 0.4 MG SL tablet Place 1 tablet (0.4 mg total) under the tongue every 5 (five) minutes as needed for chest pain.  20 tablet  0  . pantoprazole (PROTONIX) 40 MG tablet Take 40 mg by mouth daily.       . polyethylene glycol (MIRALAX / GLYCOLAX) packet Take 17 g by mouth 2 (two) times daily as needed.      . potassium chloride (KLOR-CON M10) 10 MEQ tablet Take 2 tablets (20 mEq total) by mouth daily.  60 tablet  6  . ZETIA 10 MG tablet TAKE 1 TABLET BY MOUTH DAILY  30 tablet  11   Katherine current facility-administered medications for this visit.    Allergies:    Allergies  Allergen Reactions  . Penicillins Swelling  . Simvastatin Other (See Comments)    Myalgia   . Ace Inhibitors Cough  . Amlodipine Besylate Rash  . Aspirin Nausea Only, Rash and Other (See Comments)    GI distress  . Food Other (See Comments)    Berries,tomato  . Nutritional Supplements Other (See Comments)    Unknown.    Social History:  The Mathis  reports that she has never smoked. She has never used smokeless tobacco. She reports that she does not drink alcohol or use illicit drugs.   Family history:  Katherine family history on file.  ROS:  Please see the history of present illness.  All other systems reviewed and negative.   PHYSICAL EXAM: VS:  BP 100/60  Pulse 62  Ht 5\' 7"  (1.702 m)  Wt 139 lb (63.05 kg)  BMI 21.77 kg/m2 Thin, well developed, in Katherine acute distress HEENT: Pupils are equal round react to light accommodation extraocular movements are intact. Very hard of hearing. Neck:  Positive JVDNo cervical lymphadenopathy. Cardiac: Irreg with 2/6 sys MM  Lungs:  clear to auscultation bilaterally, Katherine wheezing, rhonchi or rales Abd: +BS, Nontender,  Distended  Ext: 3+ tense lower extremity edema.  2+ radial and PT pulses. Skin: warm and dry Neuro:  Grossly normal      ASSESSMENT AND PLAN:  Problem List Items Addressed This Visit    Atrial fibrillation, permanent - Primary (Chronic)     Heart rate is well controlled. Continue Coreg.      Coronary artery disease - S/P remote CABG x 4 (1980's) (Chronic)   Aortic stenosis- moderate to moderate/ severe 04/11/13 (Chronic)   Chronic renal insufficiency, stage III (moderate) (Chronic)   HOH (hard of hearing) (Chronic)   Pacemaker - St. Jude, dual ch. programmed VVIR   Acute on chronic diastolic congestive heart failure  The Mathis will be admitted for IV diuresis.  Telemetry floor.  Will check basic labs: CBC and basic metabolic panel.  We'll also consult hospice and palliative medicine.  Strict in/outs.  Daily weights.  Supplemental potassium.

## 2013-09-22 NOTE — Assessment & Plan Note (Signed)
Heart rate is well controlled. Continue Coreg.

## 2013-09-22 NOTE — Patient Instructions (Signed)
1.  Admit for IV diuresis.

## 2013-09-22 NOTE — H&P (Signed)
Date:  09/22/2013   ID:  [name], DOB [birth year], MRN [id number]  PCP:  No PCP Per Patient  Primary Cardiologist:  Joaquin Knebel     History of Present Illness: [name] is a 80 y.o. female Mrs. [name] continues to gradually deteriorate. She is less energetic than before and spends a large part of the day sleeping. She has orthopnea. She has had a couple of falls tripping over her oxygen tubing. She does not complain of shortness of breath but appears to have multiple manifestations of low cardiac output. Her edema has waxed and waned over the last one to 2 weeks and her family is adjusting her diuretics accordingly. Metolazone was restarted in the beginning of February when it was a Friday.  Her last visit Dr. Royann Shivers relaxed her ventricular rate control medications and reduced her lower rate pacing limit in an effort to prevent right ventricular apical pacing. She is now only pacing the ventricle about 21% of the time. The benefit from those interventions appears to be very temporary. Her underlying rhythm is atrial fibrillation with ventricular rates that vary from the 40s to the 80s. Warfarin has been discontinued due to gastrointestinal bleeding.   She has moderate to severe aortic valve stenosis and mitral valve regurgitation. Has a history of coronary disease with a remote bypass procedure more than 20 years ago. In addition she has moderate to severe chronic kidney disease with a estimated GFR of 25-30 mL per minute (Creatinine 1.6, seems to be her baseline).   She presents today for followup evaluation home hospice and home health nurse saw the patient. She has had actual 2 pounds weight gain his weight loss. Continues to have severe lower extremity edema, JVD, abdominal distention and orthopnea.  While here in the clinic she is on oxygen therapy is sitting comfortably on the table.  She sleeps in a hospital bed at home which is elevated.  The patient currently denies  nausea, vomiting, fever, chest pain,  dizziness, cough, congestion, abdominal pain, hematochezia, melena.  Wt Readings from Last 3 Encounters:  09/22/13 139 lb (63.05 kg)  08/27/13 137 lb 9.6 oz (62.415 kg)  05/22/13 130 lb 12.8 oz (59.33 kg)     Past Medical History  Diagnosis Date  . CHF (congestive heart failure)     EF 30-35% on 2D Echo in July 2012  . High cholesterol   . Atrial fibrillation     rate controlled, on chronic coumadin therapy  . Sick sinus syndrome     s/p pacemaker  . Chronic kidney disease (CKD), stage III (moderate)   . Anemia     baseline 7.8-9.0  . Coronary artery disease     stent in 2008 shows CABG to LAD, RCA and circumflex with 100% occlusion  . Heart murmur   . Mitral regurgitation     severe MR,EF 30-35%  . Ischemic cardiomyopathy   . Aortic stenosis     severe  . Renal insufficiency, mild     Current Outpatient Prescriptions  Medication Sig Dispense Refill  . aspirin 81 MG tablet Take 1 tablet (81 mg total) by mouth daily.  30 tablet    . carvedilol (COREG) 3.125 MG tablet Take 3.125 mg by mouth 2 (two) times daily.      Marland Kitchen donepezil (ARICEPT) 5 MG tablet Take 5 mg by mouth at bedtime.      . furosemide (LASIX) 40 MG tablet Take 1.5 tablets (60 mg total) by mouth  2 (two) times daily.  30 tablet    . hydrALAZINE (APRESOLINE) 25 MG tablet Take 1 tablet (25 mg total) by mouth every 12 (twelve) hours.  60 tablet  5  . hydrocortisone (PROCTOSOL HC) 2.5 % rectal cream Place rectally 2 (two) times daily.  30 g  0  . hydrocortisone-pramoxine (PROCTOFOAM-HC) rectal foam Place 1 applicator rectally 2 (two) times daily.  10 g  0  . isosorbide dinitrate (ISORDIL) 10 MG tablet Take 1 tablet (10 mg total) by mouth 2 (two) times daily.  60 tablet  5  . LORazepam (ATIVAN) 0.5 MG tablet TAKE 1 TABLET BY MOUTH EVERY 8 HOURS AS DIRECTED  30 tablet  0  . metolazone (ZAROXOLYN) 2.5 MG tablet Take 1 tablet daily on Mondays, Wednesdays and Fridays only, take 30  minutes before morning dose of furosemide  45 tablet  3  . nitroGLYCERIN (NITROSTAT) 0.4 MG SL tablet Place 1 tablet (0.4 mg total) under the tongue every 5 (five) minutes as needed for chest pain.  20 tablet  0  . pantoprazole (PROTONIX) 40 MG tablet Take 40 mg by mouth daily.       . polyethylene glycol (MIRALAX / GLYCOLAX) packet Take 17 g by mouth 2 (two) times daily as needed.      . potassium chloride (KLOR-CON M10) 10 MEQ tablet Take 2 tablets (20 mEq total) by mouth daily.  60 tablet  6  . ZETIA 10 MG tablet TAKE 1 TABLET BY MOUTH DAILY  30 tablet  11   No current facility-administered medications for this visit.    Allergies:    Allergies  Allergen Reactions  . Penicillins Swelling  . Simvastatin Other (See Comments)    Myalgia   . Ace Inhibitors Cough  . Amlodipine Besylate Rash  . Aspirin Nausea Only, Rash and Other (See Comments)    GI distress  . Food Other (See Comments)    Berries,tomato  . Nutritional Supplements Other (See Comments)    Unknown.    Social History:  The patient  reports that she has never smoked. She has never used smokeless tobacco. She reports that she does not drink alcohol or use illicit drugs.   Family history: Not relevant in this 80 yo female  ROS:  Please see the history of present illness.  All other systems reviewed and negative.   PHYSICAL EXAM: VS:  BP 100/60  Pulse 62  Ht 5\' 7"  (1.702 m)  Wt 139 lb (63.05 kg)  BMI 21.77 kg/m2 Thin, well developed, in no acute distress HEENT: Pupils are equal round react to light accommodation extraocular movements are intact. Very hard of hearing. Neck:  Positive JVDNo cervical lymphadenopathy. Cardiac: Irreg with 2/6 sys MM  Lungs:  clear to auscultation bilaterally, no wheezing, rhonchi or rales Abd: +BS, Nontender,  Distended  Ext: 3+ tense lower extremity edema.  2+ radial and PT pulses. Skin: warm and dry Neuro:  Grossly normal      ASSESSMENT AND PLAN:  Problem List Items  Addressed This Visit   Atrial fibrillation, permanent - Primary (Chronic)     Heart rate is well controlled. Continue Coreg.      Coronary artery disease - S/P remote CABG x 4 (1980's) (Chronic)   Aortic stenosis- moderate to moderate/ severe 04/11/13 (Chronic)   Chronic renal insufficiency, stage III (moderate) (Chronic)   HOH (hard of hearing) (Chronic)   Pacemaker - St. Jude, dual ch. programmed VVIR   Acute on chronic diastolic congestive heart  failure     The patient will be admitted for IV diuresis.  Telemetry floor.  Will check basic labs: CBC and basic metabolic panel.  We'll also consult hospice and palliative medicine.  Strict in/outs.  Daily weights.  Supplemental potassium.    I have seen and examined the patient along with Wilburt Finlay, PA.  I have reviewed the chart, notes and new data.  I agree with PA's note.   PLAN: Her gradual severe deterioration continues. Admit for palliation of symptoms of HF. I believe hospice referral is appropriate.  Thurmon Fair, MD, [facility] [phone] 09/22/2013, 1:19 PM

## 2013-09-23 ENCOUNTER — Inpatient Hospital Stay (HOSPITAL_COMMUNITY): Payer: Medicare Other

## 2013-09-23 DIAGNOSIS — D649 Anemia, unspecified: Secondary | ICD-10-CM

## 2013-09-23 LAB — BASIC METABOLIC PANEL
BUN: 93 mg/dL — AB (ref 6–23)
CHLORIDE: 100 meq/L (ref 96–112)
CO2: 24 mEq/L (ref 19–32)
Calcium: 8.8 mg/dL (ref 8.4–10.5)
Creatinine, Ser: 3.28 mg/dL — ABNORMAL HIGH (ref 0.50–1.10)
GFR calc Af Amer: 13 mL/min — ABNORMAL LOW (ref >90)
GFR calc non Af Amer: 11 mL/min — ABNORMAL LOW (ref >90)
GLUCOSE: 100 mg/dL — AB (ref 70–99)
POTASSIUM: 3.8 meq/L (ref 3.7–5.3)
Sodium: 141 mEq/L (ref 137–147)

## 2013-09-23 NOTE — Progress Notes (Signed)
Subjective: Somnolent but awakens to touch. No complaints.   Objective: Vital signs in last 24 hours: Temp:  [97.4 F (36.3 C)-98.3 F (36.8 C)] 97.9 F (36.6 C) (03/04 0508) Pulse Rate:  [55-65] 60 (03/04 0508) Resp:  [18] 18 (03/04 0508) BP: (96-106)/(53-66) 96/53 mmHg (03/04 0508) SpO2:  [98 %-100 %] 100 % (03/04 0508) Weight:  [139 lb (63.05 kg)-141 lb 15.6 oz (64.4 kg)] 139 lb 1.8 oz (63.1 kg) (03/04 0508) Last BM Date: 09/21/13  Intake/Output from previous day: 03/03 0701 - 03/04 0700 In: 243 [P.O.:240; I.V.:3] Out: -  Intake/Output this shift:    Medications Current Facility-Administered Medications  Medication Dose Route Frequency Provider Last Rate Last Dose  . 0.9 %  sodium chloride infusion  250 mL Intravenous PRN Wilburt FinlayBryan Hager, PA-C      . carvedilol (COREG) tablet 3.125 mg  3.125 mg Oral BID WC Wilburt FinlayBryan Hager, PA-C   3.125 mg at 09/22/13 1645  . donepezil (ARICEPT) tablet 5 mg  5 mg Oral QHS Wilburt FinlayBryan Hager, PA-C   5 mg at 09/22/13 2128  . furosemide (LASIX) injection 80 mg  80 mg Intravenous BID Wilburt FinlayBryan Hager, PA-C   80 mg at 09/22/13 1809  . hydrALAZINE (APRESOLINE) tablet 25 mg  25 mg Oral Q12H Wilburt FinlayBryan Hager, PA-C   25 mg at 09/22/13 2128  . hydrocortisone (ANUSOL-HC) 2.5 % rectal cream   Rectal BID Wilburt FinlayBryan Hager, PA-C      . hydrocortisone-pramoxine [facility](PROCTOFOAM-HC) rectal foam 1 applicator  1 applicator Rectal BID Wilburt FinlayBryan Hager, PA-C   1 applicator at 09/22/13 2129  . isosorbide dinitrate (ISORDIL) tablet 10 mg  10 mg Oral BID Wilburt FinlayBryan Hager, PA-C   10 mg at 09/22/13 2127  . pantoprazole (PROTONIX) EC tablet 40 mg  40 mg Oral Daily Wilburt FinlayBryan Hager, PA-C      . polyethylene glycol (MIRALAX / GLYCOLAX) packet 17 g  17 g Oral BID PRN Wilburt FinlayBryan Hager, PA-C      . potassium chloride SA (K-DUR,KLOR-CON) CR tablet 20 mEq  20 mEq Oral BID Wilburt FinlayBryan Hager, PA-C   20 mEq at 09/22/13 2129  . sodium chloride 0.9 % injection 3 mL  3 mL Intravenous Q12H Wilburt FinlayBryan Hager, PA-C   3 mL at 09/22/13 2129  .  sodium chloride 0.9 % injection 3 mL  3 mL Intravenous PRN Wilburt FinlayBryan Hager, PA-C        PE: General appearance: cooperative, no distress and a bit somnolent Lungs: clear to auscultation bilaterally Heart: irregularly irregular rhythm and 3/6 SM best heard at LUSB Extremities: 2+ bilateral LEE Pulses: 2+ and symmetric Skin: warm and dry Neurologic: Grossly normal  Lab Results:  No results found for this basename: WBC, HGB, HCT, PLT,  in the last 72 hours BMET  Recent Labs  09/22/13 1450 09/23/13 0345  NA 142 141  K 4.6 3.8  CL 101 100  CO2 23 24  GLUCOSE 113* 100*  BUN 91* 93*  CREATININE 3.17* 3.28*  CALCIUM 9.1 8.8   BNP (last 3 results)  Recent Labs  05/12/13 0605 05/14/13 0545 09/22/13 1450  PROBNP 12524.0* 14503.0* 27527.0*    Assessment/Plan  Active Problems:   Acute on chronic diastolic heart failure  Plan: No change in medical therapy at this point. Atrial fibrillation is rate controlled. She continues to have significant bilateral LEE. She is on 80 mg IV Lasix BID. SCr is at 3.28.  K is WNL. She is resting peacefully and does not appear to be in  any pain/ distress. Awaiting palliative care consult.     LOS: 1 day    Brittainy M. Sharol Harness, PA-C 09/23/2013 8:04 AM  Atrial fibrillation, permanent - Primary (Chronic)  Heart rate is well controlled. Continue Coreg.  Coronary artery disease - S/P remote CABG x 4 (1980's) (Chronic)  Aortic stenosis- moderate to moderate/ severe 04/11/13 (Chronic)  Chronic renal insufficiency, stage III (moderate) (Chronic)  HOH (hard of hearing) (Chronic)  Pacemaker - St. Jude, dual ch. programmed VVIR  Acute on chronic diastolic congestive heart failure   Patient seen and examined. Agree with assessment and plan. I/O +243. BNP 27527; Cr 3.27 on furosemide 80 mg IV bid. BP low limiting further titration of hydralazine. Palliative care to see.   Lennette Bihari, MD, [facility] 09/23/2013 9:12 AM

## 2013-09-24 DIAGNOSIS — I5032 Chronic diastolic (congestive) heart failure: Secondary | ICD-10-CM

## 2013-09-24 LAB — BASIC METABOLIC PANEL
BUN: 91 mg/dL — AB (ref 6–23)
CHLORIDE: 99 meq/L (ref 96–112)
CO2: 25 mEq/L (ref 19–32)
CREATININE: 3.18 mg/dL — AB (ref 0.50–1.10)
Calcium: 8.8 mg/dL (ref 8.4–10.5)
GFR calc Af Amer: 13 mL/min — ABNORMAL LOW (ref >90)
GFR calc non Af Amer: 12 mL/min — ABNORMAL LOW (ref >90)
Glucose, Bld: 107 mg/dL — ABNORMAL HIGH (ref 70–99)
Potassium: 4.2 mEq/L (ref 3.7–5.3)
Sodium: 140 mEq/L (ref 137–147)

## 2013-09-24 MED ORDER — HYDRALAZINE HCL 25 MG PO TABS
25.0000 mg | ORAL_TABLET | Freq: Three times a day (TID) | ORAL | Status: DC
Start: 2013-09-24 — End: 2013-09-30
  Administered 2013-09-24 – 2013-09-30 (×11): 25 mg via ORAL
  Filled 2013-09-24 (×20): qty 1

## 2013-09-24 NOTE — Progress Notes (Signed)
Subjective:  No chest pain  Objective:   Vital Signs in the last 24 hours: Temp:  [97.6 F (36.4 C)-98.1 F (36.7 C)] 98.1 F (36.7 C) (03/05 0626) Pulse Rate:  [56-65] 65 (03/05 0626) Resp:  [18-20] 20 (03/05 0626) BP: (95-109)/(55-59) 109/58 mmHg (03/05 0626) SpO2:  [100 %] 100 % (03/05 0626) Weight:  [135 lb 9.3 oz (61.5 kg)] 135 lb 9.3 oz (61.5 kg) (03/05 0626)  Intake/Output from previous day: 03/04 0701 - 03/05 0700 In: 483 [P.O.:480; I.V.:3] Out: 700 [Urine:700]  Medications: . carvedilol  3.125 mg Oral BID WC  . donepezil  5 mg Oral QHS  . furosemide  80 mg Intravenous BID  . hydrALAZINE  25 mg Oral Q12H  . hydrocortisone   Rectal BID  . hydrocortisone-pramoxine  1 applicator Rectal BID  . isosorbide dinitrate  10 mg Oral BID  . pantoprazole  40 mg Oral Daily  . potassium chloride  20 mEq Oral BID  . sodium chloride  3 mL Intravenous Q12H       Physical Exam:   General appearance: somnolent Neck: thyroid not enlarged, symmetric, no tenderness/mass/nodules and JVD  8 cm Lungs: decreased BS Heart: regular rate and rhythm and 2/6 sem Abdomen: soft, non-tender; bowel sounds normal; no masses,  no organomegaly Extremities: 1+ LE edema Neurologic: Grossly normal    Rate: 60  Rhythm: V paced  Lab Results:    Recent Labs  09/23/13 0345 09/24/13 0254  NA 141 140  K 3.8 4.2  CL 100 99  CO2 24 25  GLUCOSE 100* 107*  BUN 93* 91*  CREATININE 3.28* 3.18*   No results found for this basename: TROPONINI, CK, MB,  in the last 72 hours Hepatic Function Panel No results found for this basename: PROT, ALBUMIN, AST, ALT, ALKPHOS, BILITOT, BILIDIR, IBILI,  in the last 72 hours No results found for this basename: INR,  in the last 72 hours BNP (last 3 results)  Recent Labs  05/12/13 0605 05/14/13 0545 09/22/13 1450  PROBNP 12524.0* 14503.0* 27527.0*    Lipid Panel     Component Value Date/Time   CHOL  Value: 115        ATP III CLASSIFICATION:   <200     mg/dL   Desirable  196-222  mg/dL   Borderline High  >=979    mg/dL   High        02/28/2118 0540   TRIG 56 03/29/2009 0540   HDL 54 03/29/2009 0540   CHOLHDL 2.1 03/29/2009 0540   VLDL 11 03/29/2009 0540   LDLCALC  Value: 50        Total Cholesterol/HDL:CHD Risk Coronary Heart Disease Risk Table                     Men   Women  1/2 Average Risk   3.4   3.3  Average Risk       5.0   4.4  2 X Average Risk   9.6   7.1  3 X Average Risk  23.4   11.0        Use the calculated Patient Ratio above and the CHD Risk Table to determine the patient's CHD Risk.        ATP III CLASSIFICATION (LDL):  <100     mg/dL   Optimal  417-408  mg/dL   Near or Above  Optimal  130-159  mg/dL   Borderline  [id number]  mg/dL   High  >045>190     mg/dL   Very High 4/0/98119/01/2009 [id number]      Imaging:  Dg Chest 2 View  09/23/2013   CLINICAL DATA:  Congestive heart failure exacerbation.  EXAM: CHEST  2 VIEW  COMPARISON:  Chest x-ray 05/13/2013.  FINDINGS: Low lung volumes. There is cephalization of the pulmonary vasculature and slight indistinctness of the interstitial markings suggestive of mild pulmonary edema. Small bilateral pleural effusions. Moderate cardiomegaly. Atherosclerosis in the thoracic aorta. Left-sided pacemaker device in place with lead tip projecting over the expected location of the right ventricular apex. Status post median sternotomy.  IMPRESSION: 1. Low lung volumes with evidence to suggest mild congestive heart failure, as above. 2. Atherosclerosis.   Electronically Signed   By: Trudie Reedaniel  Entrikin M.D.   On: 09/23/2013 09:33      Assessment/Plan:   Active Problems:   Acute on chronic diastolic heart failure   Atrial fibrillation, permanent - Primary (Chronic)  Heart rate is well controlled. Continue Coreg.  Coronary artery disease - S/P remote CABG x 4 (1980's) (Chronic)  Aortic stenosis- moderate to moderate/ severe 04/11/13 (Chronic)  Chronic renal insufficiency, stage III (moderate) (Chronic)    HOH (hard of hearing) (Chronic)  Pacemaker - St. Jude, dual ch. programmed VVIR  Acute on chronic diastolic congestive heart failure   I/O +26 since admission. Cr 3.18, slightly better. Will increase hydralazine to every 8 hrs from bid. Palliative care.    Lennette Biharihomas A. Kelly, MD, [facility] 09/24/2013, 9:54 AM

## 2013-09-25 LAB — BASIC METABOLIC PANEL
BUN: 90 mg/dL — ABNORMAL HIGH (ref 6–23)
CALCIUM: 8.9 mg/dL (ref 8.4–10.5)
CO2: 23 meq/L (ref 19–32)
Chloride: 98 mEq/L (ref 96–112)
Creatinine, Ser: 2.94 mg/dL — ABNORMAL HIGH (ref 0.50–1.10)
GFR calc Af Amer: 15 mL/min — ABNORMAL LOW (ref >90)
GFR calc non Af Amer: 13 mL/min — ABNORMAL LOW (ref >90)
Glucose, Bld: 97 mg/dL (ref 70–99)
Potassium: 3.9 mEq/L (ref 3.7–5.3)
SODIUM: 139 meq/L (ref 137–147)

## 2013-09-25 LAB — PRO B NATRIURETIC PEPTIDE: Pro B Natriuretic peptide (BNP): 28775 pg/mL — ABNORMAL HIGH (ref 0–450)

## 2013-09-25 MED ORDER — POLYETHYLENE GLYCOL 3350 17 G PO PACK
17.0000 g | PACK | Freq: Every day | ORAL | Status: DC
  Administered 2013-09-25 – 2013-09-30 (×6): 17 g via ORAL
  Filled 2013-09-25 (×6): qty 1

## 2013-09-25 MED ORDER — DOCUSATE SODIUM 100 MG PO CAPS
200.0000 mg | ORAL_CAPSULE | Freq: Every day | ORAL | Status: DC
  Administered 2013-09-25 – 2013-09-30 (×6): 200 mg via ORAL
  Filled 2013-09-25 (×6): qty 2

## 2013-09-25 NOTE — Progress Notes (Signed)
         Subjective: Complains of nausea, no recent BM, no chest pain, mild SOB  Objective: Vital signs in last 24 hours: Temp:  [97.3 F (36.3 C)-97.9 F (36.6 C)] 97.9 F (36.6 C) (03/06 6834) Pulse Rate:  [60-75] 60 (03/06 0608) Resp:  [18-19] 18 (03/05 2017) BP: (100-113)/(51-63) 108/52 mmHg (03/06 0608) SpO2:  [100 %] 100 % (03/06 0608) Weight:  [132 lb 7.9 oz (60.1 kg)] 132 lb 7.9 oz (60.1 kg) (03/06 0606) Weight change: -3 lb 1.4 oz (-1.4 kg) Last BM Date: 09/24/13 Intake/Output from previous day: +110 (+136 since admit)  Wt down from 141.15 to 132.7   03/05 0701 - 03/06 0700 In: 840 [P.O.:840] Out: 730 [Urine:730] Intake/Output this shift:    PE: General:Pleasant affect, NAD Skin:Warm and dry, brisk capillary refill HEENT:normocephalic, sclera clear, mucus membranes moist Neck:supple, + JVD  Heart:S1S2 RRR with murmur, no gallup, rub or click Lungs: with rales, no rhonchi, or wheezes HDQ:QIWLNLGXQ, non tender, + BS, do not palpate liver spleen or masses Ext:2+ lower ext edema, Neuro:alert and oriented, MAE, follows commands, + facial symmetry   Lab Results: No results found for this basename: WBC, HGB, HCT, PLT,  in the last 72 hours BMET  Recent Labs  09/24/13 0254 09/25/13 0330  NA 140 139  K 4.2 3.9  CL 99 98  CO2 25 23  GLUCOSE 107* 97  BUN 91* 90*  CREATININE 3.18* 2.94*  CALCIUM 8.8 8.9       Studies/Results: No results found.  Medications: I have reviewed the patient's current medications. Scheduled Meds: . carvedilol  3.125 mg Oral BID WC  . donepezil  5 mg Oral QHS  . furosemide  80 mg Intravenous BID  . hydrALAZINE  25 mg Oral 3 times per day  . hydrocortisone   Rectal BID  . hydrocortisone-pramoxine  1 applicator Rectal BID  . isosorbide dinitrate  10 mg Oral BID  . pantoprazole  40 mg Oral Daily  . potassium chloride  20 mEq Oral BID  . sodium chloride  3 mL Intravenous Q12H   Continuous Infusions:  PRN Meds:.sodium  chloride, polyethylene glycol, sodium chloride  Assessment/Plan: Principal Problem:   Acute on chronic diastolic heart failure Active Problems:   Atrial fibrillation, permanent   Chronic diastolic heart failure   Coronary artery disease - S/P remote CABG x 4 (1980's)   Aortic stenosis- moderate to moderate/ severe 04/11/13   Pacemaker - St. Jude, dual ch. programmed VVIR   Chronic renal insufficiency, stage III (moderate)  PLAN: Cr. Improving, will recheck Pro BNP in am. Will check cbc as well. Add laxative.   LOS: 3 days   Time spent with pt. :15 minutes. [facility]  Nurse Practitioner Certified Pager [phone] or after 5pm and on weekends call [phone] 09/25/2013, 9:51 AM    Patient seen and examined. Agree with assessment and plan. Tolerating increased hydralazine.  Breathing better. Weight down 9 lbs since admission. Palliative care.   Lennette Bihari, MD, [facility] 09/25/2013 10:34 AM

## 2013-09-26 LAB — CBC
HEMATOCRIT: 20.3 % — AB (ref 36.0–46.0)
Hemoglobin: 7 g/dL — ABNORMAL LOW (ref 12.0–15.0)
MCH: 31.7 pg (ref 26.0–34.0)
MCHC: 34.5 g/dL (ref 30.0–36.0)
MCV: 91.9 fL (ref 78.0–100.0)
Platelets: 217 10*3/uL (ref 150–400)
RBC: 2.21 MIL/uL — ABNORMAL LOW (ref 3.87–5.11)
RDW: 16.4 % — ABNORMAL HIGH (ref 11.5–15.5)
WBC: 4.6 10*3/uL (ref 4.0–10.5)

## 2013-09-26 LAB — BASIC METABOLIC PANEL
BUN: 88 mg/dL — AB (ref 6–23)
CHLORIDE: 99 meq/L (ref 96–112)
CO2: 23 meq/L (ref 19–32)
CREATININE: 2.78 mg/dL — AB (ref 0.50–1.10)
Calcium: 8.8 mg/dL (ref 8.4–10.5)
GFR calc Af Amer: 16 mL/min — ABNORMAL LOW (ref >90)
GFR calc non Af Amer: 14 mL/min — ABNORMAL LOW (ref >90)
GLUCOSE: 107 mg/dL — AB (ref 70–99)
Potassium: 3.9 mEq/L (ref 3.7–5.3)
Sodium: 138 mEq/L (ref 137–147)

## 2013-09-26 LAB — GLUCOSE, CAPILLARY: Glucose-Capillary: 121 mg/dL — ABNORMAL HIGH (ref 70–99)

## 2013-09-26 MED ORDER — BISACODYL 10 MG RE SUPP: 10.0000 mg | Freq: Every day | RECTAL | Status: DC | PRN

## 2013-09-26 NOTE — Progress Notes (Signed)
Thank you for consulting the Palliative Medicine Team at [facility] to meet your patient's and family's needs.   The reason that you asked [place] to see your patient is  For GOC  We have scheduled your patient for a meeting:  3/8 at 1 pm  The Surrogate decision make is: Niece  [name]  Contact information:[phone]   Other family members that need to be present:  At her discretion    Your patient is able/unable to participate: very hard of hearing  Additional Narrative:  Patient reports has not had a bowel movement in two days, no over pain or nausea/vomiting.  Added dulcolax suppository.  Jennings Stirling L. Ladona Ridgel, MD MBA The Palliative Medicine Team at [facility] Phone: [phone] Pager: [phone]

## 2013-09-26 NOTE — Progress Notes (Addendum)
PROGRESS NOTE  Subjective:   [name] is a 80 year old female who has been gradually deteriorating over the past several weeks. She's been falling frequently.  She has moderate to severe aortic stenosis and mitral regurgitation. She has a history of coronary artery disease with remote bypass grafting with 20 years ago. In addition, she has chronic kidney disease.  Objective:    Vital Signs:   Temp:  [97.6 F (36.4 C)-98 F (36.7 C)] 97.7 F (36.5 C) (03/07 0458) Pulse Rate:  [60-82] 66 (03/07 0611) Resp:  [18] 18 (03/07 0458) BP: (84-116)/(40-68) 116/67 mmHg (03/07 0611) SpO2:  [99 %-100 %] 100 % (03/07 0458) Weight:  [133 lb 9.6 oz (60.6 kg)] 133 lb 9.6 oz (60.6 kg) (03/07 0458)  Last BM Date: 09/25/13   24-hour weight change: Weight change: 1 lb 1.6 oz (0.5 kg)  Weight trends: Filed Weights   09/24/13 0626 09/25/13 0606 09/26/13 0458  Weight: 135 lb 9.3 oz (61.5 kg) 132 lb 7.9 oz (60.1 kg) 133 lb 9.6 oz (60.6 kg)    Intake/Output:  03/06 0701 - 03/07 0700 In: 720 [P.O.:720] Out: 1350 [Urine:1350] Total I/O In: 240 [P.O.:240] Out: 900 [Urine:900]   Physical Exam: BP 116/67  Pulse 66  Temp(Src) 97.7 F (36.5 C) (Oral)  Resp 18  Ht 5\' 7"  (1.702 m)  Wt 133 lb 9.6 oz (60.6 kg)  BMI 20.92 kg/m2  SpO2 100%  Wt Readings from Last 3 Encounters:  09/26/13 133 lb 9.6 oz (60.6 kg)  09/22/13 139 lb (63.05 kg)  08/27/13 137 lb 9.6 oz (62.415 kg)    General: Vital signs reviewed and noted. , would look at me but would not speak, appears to be very weak  Head: Normocephalic, atraumatic.  Eyes: conjunctivae/corneas clear.  EOM's intact.   Throat: normal  Neck:  increase JVD  Lungs:   bilateral rhonchi, rales  Heart:  IRRG, , + 2/6 systolic murmur  Abdomen:  Soft, non-tender, non-distended    Extremities: No edema   Neurologic: Hard of hearing  Psych:         Labs: BMET:  Recent Labs  09/25/13 0330 09/26/13 0315  NA 139 138  K 3.9 3.9  CL 98  99  CO2 23 23  GLUCOSE 97 107*  BUN 90* 88*  CREATININE 2.94* 2.78*  CALCIUM 8.9 8.8    Liver function tests: No results found for this basename: AST, ALT, ALKPHOS, BILITOT, PROT, ALBUMIN,  in the last 72 hours No results found for this basename: LIPASE, AMYLASE,  in the last 72 hours  CBC:  Recent Labs  09/26/13 0315  WBC 4.6  HGB 7.0*  HCT 20.3*  MCV 91.9  PLT 217    Cardiac Enzymes: No results found for this basename: CKTOTAL, CKMB, TROPONINI,  in the last 72 hours  Coagulation Studies: No results found for this basename: LABPROT, INR,  in the last 72 hours  Other: No components found with this basename: POCBNP,  No results found for this basename: DDIMER,  in the last 72 hours No results found for this basename: HGBA1C,  in the last 72 hours No results found for this basename: CHOL, HDL, LDLCALC, TRIG, CHOLHDL,  in the last 72 hours No results found for this basename: TSH, T4TOTAL, FREET3, T3FREE, THYROIDAB,  in the last 72 hours No results found for this basename: VITAMINB12, FOLATE, FERRITIN, TIBC, IRON, RETICCTPCT,  in the last 72 hours   Other results:  EKG  Medications:    Infusions:    Scheduled Medications: . carvedilol  3.125 mg Oral BID WC  . docusate sodium  200 mg Oral Daily  . donepezil  5 mg Oral QHS  . furosemide  80 mg Intravenous BID  . hydrALAZINE  25 mg Oral 3 times per day  . hydrocortisone   Rectal BID  . hydrocortisone-pramoxine  1 applicator Rectal BID  . isosorbide dinitrate  10 mg Oral BID  . pantoprazole  40 mg Oral Daily  . polyethylene glycol  17 g Oral Daily  . potassium chloride  20 mEq Oral BID  . sodium chloride  3 mL Intravenous Q12H    Assessment/ Plan:    1. Chronic atrial fibrillation: 2. Chronic diastolic congestive heart failure  3. Coronary artery disease:    4. Aortic stenosis:    5. Status post pacemaker implant:    6. Chronic renal insufficiency-stage IV .- her creatinine is 2.78.    this appears to  be baseline. 7. Anemia:  Her hemoglobin today is 7.0. His follow him from 9.1 last October.  She has numerous medical issues - I'm not sure that she has responded to our therapy yet.   I agree with consulting Hospice and Palliative care.    I have  called her niece   [name]([name] [phone]).  We discussed her poor prognosis.  She had said that she thought Hospice and Palliative care had been called but I do not see any notes from Hospice.    Will call them today.   I talked to Ag[employer] TechnologiesMelissa Taylor.  She will see patient tomorrow.    Disposition:  Length of Stay: 4  Vesta MixerPhilip J. Salimata Christenson, [name], MD, [facility]   09/26/2013, 11:32 AM Office [phone] Pager [phone]

## 2013-09-26 NOTE — Progress Notes (Signed)
Pt was complaining of left side chest pain.  Says it has been on and off a few days at times.  MD notified.  Will continue to monitor.

## 2013-09-27 DIAGNOSIS — R0989 Other specified symptoms and signs involving the circulatory and respiratory systems: Secondary | ICD-10-CM

## 2013-09-27 DIAGNOSIS — R0609 Other forms of dyspnea: Secondary | ICD-10-CM

## 2013-09-27 LAB — BASIC METABOLIC PANEL
BUN: 91 mg/dL — ABNORMAL HIGH (ref 6–23)
CO2: 23 meq/L (ref 19–32)
Calcium: 9.2 mg/dL (ref 8.4–10.5)
Chloride: 98 mEq/L (ref 96–112)
Creatinine, Ser: 2.7 mg/dL — ABNORMAL HIGH (ref 0.50–1.10)
GFR calc Af Amer: 16 mL/min — ABNORMAL LOW (ref >90)
GFR, EST NON AFRICAN AMERICAN: 14 mL/min — AB (ref >90)
GLUCOSE: 122 mg/dL — AB (ref 70–99)
POTASSIUM: 4.4 meq/L (ref 3.7–5.3)
SODIUM: 139 meq/L (ref 137–147)

## 2013-09-27 LAB — GLUCOSE, CAPILLARY: Glucose-Capillary: 120 mg/dL — ABNORMAL HIGH (ref 70–99)

## 2013-09-27 MED ORDER — MORPHINE SULFATE 2 MG/ML IJ SOLN
1.0000 mg | INTRAMUSCULAR | Status: DC | PRN
  Administered 2013-09-27: 1 mg via INTRAVENOUS
  Filled 2013-09-27: qty 1

## 2013-09-27 NOTE — Progress Notes (Addendum)
PROGRESS NOTE  Subjective:   [name] is a 80 year old female who has been gradually deteriorating over the past several weeks. She's been falling frequently.  She has moderate to severe aortic stenosis and mitral regurgitation. She has a history of coronary artery disease with remote bypass grafting with 20 years ago. In addition, she has chronic kidney disease.  She was not able to hear me this am - despite my yelling quite loudly.  Does not appear to be in any pain.  Objective:    Vital Signs:   Temp:  [97.6 F (36.4 C)-98.2 F (36.8 C)] 97.6 F (36.4 C) (03/08 0412) Pulse Rate:  [62-75] 73 (03/08 0412) Resp:  [18-20] 20 (03/08 0412) BP: (98-137)/(44-87) 137/87 mmHg (03/08 0412) SpO2:  [96 %-100 %] 100 % (03/08 0412)  Last BM Date: 09/26/13   24-hour weight change: Weight change:   Weight trends: Filed Weights   09/24/13 0626 09/25/13 0606 09/26/13 0458  Weight: 135 lb 9.3 oz (61.5 kg) 132 lb 7.9 oz (60.1 kg) 133 lb 9.6 oz (60.6 kg)    Intake/Output:  03/07 0701 - 03/08 0700 In: 530 [P.O.:530] Out: 1301 [Urine:1300; Stool:1]     Physical Exam: BP 137/87  Pulse 73  Temp(Src) 97.6 F (36.4 C) (Oral)  Resp 20  Ht 5\' 7"  (1.702 m)  Wt 133 lb 9.6 oz (60.6 kg)  BMI 20.92 kg/m2  SpO2 100%  Wt Readings from Last 3 Encounters:  09/26/13 133 lb 9.6 oz (60.6 kg)  09/22/13 139 lb (63.05 kg)  08/27/13 137 lb 9.6 oz (62.415 kg)    General: Vital signs reviewed and noted. , would look at me but would not speak, appears to be very weak  Head: Normocephalic, atraumatic.  Eyes: conjunctivae/corneas clear.  EOM's intact.   Throat: normal  Neck:  increase JVD  Lungs:  bilateral rhonchi, rales  Heart:  IRRG, , + 3/6 systolic murmur  Abdomen:  Soft, non-tender, non-distended    Extremities: No edema   Neurologic: Very hard of hearing, will roll over in response to tapping or yelling.    Psych: Unable to assess   Labs: BMET:  Recent Labs  09/26/13 0315  09/27/13 0448  NA 138 139  K 3.9 4.4  CL 99 98  CO2 23 23  GLUCOSE 107* 122*  BUN 88* 91*  CREATININE 2.78* 2.70*  CALCIUM 8.8 9.2    Liver function tests: No results found for this basename: AST, ALT, ALKPHOS, BILITOT, PROT, ALBUMIN,  in the last 72 hours No results found for this basename: LIPASE, AMYLASE,  in the last 72 hours  CBC:  Recent Labs  09/26/13 0315  WBC 4.6  HGB 7.0*  HCT 20.3*  MCV 91.9  PLT 217    Cardiac Enzymes: No results found for this basename: CKTOTAL, CKMB, TROPONINI,  in the last 72 hours  Coagulation Studies: No results found for this basename: LABPROT, INR,  in the last 72 hours   Other results:  EKG   Medications:    Infusions:    Scheduled Medications: . carvedilol  3.125 mg Oral BID WC  . docusate sodium  200 mg Oral Daily  . donepezil  5 mg Oral QHS  . furosemide  80 mg Intravenous BID  . hydrALAZINE  25 mg Oral 3 times per day  . hydrocortisone   Rectal BID  . hydrocortisone-pramoxine  1 applicator Rectal BID  . isosorbide dinitrate  10 mg Oral BID  . pantoprazole  40 mg Oral Daily  . polyethylene glycol  17 g Oral Daily  . potassium chloride  20 mEq Oral BID  . sodium chloride  3 mL Intravenous Q12H    Assessment/ Plan:    1. Chronic atrial fibrillation: 2. Chronic diastolic congestive heart failure 3. Coronary artery disease:    4. Aortic stenosis:    5. Status post pacemaker implant:    6. Chronic renal insufficiency-stage IV .- her creatinine is 2.78.    this appears to be baseline. 7. Anemia:  Her hemoglobin today is 7.0. His follow him from 9.1 last October.  She has numerous medical issues - I'm not sure that she has responded to our therapy yet.   I agree with consulting Hospice and Palliative care.    She has been seen by Dr. Ladona Ridgel of Hospice and Palliative care.   She has a meeting set up for 1 PM today. I would request transfer to the hospice service - either home or in hospital.  I do not think that  we have much to add from a cardiology standpoint.  Disposition:  Length of Stay: 5  Vesta Mixer, [name]., MD, [facility]   09/27/2013, 10:56 AM Office [phone] Pager [phone]

## 2013-09-27 NOTE — Consult Note (Signed)
Patient [name]      DOB: 1917/12/29      NZV:[id number]     Consult Note from the Palliative Medicine Team at [facility]    Consult Requested by: Dr. Melburn Popper    PCP: No PCP Per Patient Reason for Consultation: GOC     Phone Number:None  Assessment of patients Current state: 80 yr old african [place] female with known valvular disease and systolic congestive heart failure.  Admitted with decompensated heart failure.  Patient is care for by her neices, having no children of her own. Her niece Clydie Braun is legal surrogate and has worked as Lawyer and for [employer].  She recognizes her aunts decline.  We are unable to have her participate today do to low batteries in her hearing aid, but Karen's feeling is that Hospice would be an appropriate approach at this time.  Patient may be candidate for home with hospice if able to assist with ADL, vs Hospice Facility placement.  We agreed to have her evaluated by PT to see what she can do to assist in her care.  We will remeet tomorrow and provide [name] with new hearing aide batteries so that she can participate if she can.   Goals of Care: 1.  Code Status: DNR   2. Scope of Treatment: Continue to treat symptomatically but do not escalate care.  4. Disposition: to be determined either home with hospice or hospice facility   3. Symptom Management:   1. Dyspnea: continue to diuresis 2. Fall precautions 3. Hard of hearing provide batteries for hearing aids 4. Comfort feeding  4. Psychosocial:[name] worked in the [employer] office at [employer]  She never married or had children  5. Spiritual: offered spiritual care support        Patient Documents Completed or Given: Document Given Completed  Advanced Directives Pkt    MOST    DNR    Gone from My Sight    Hard Choices      Brief HPI: 80 yr old african [place] female admitted with weakness, falls, increased lower extremity edema.  Symptoms consistent with low cardiac output.   We were asked to assist with goals of care.   ROS: unable to express goals of care    PMH:  Past Medical History  Diagnosis Date  . CHF (congestive heart failure)     EF 30-35% on 2D Echo in July 2012  . High cholesterol   . Atrial fibrillation     rate controlled, on chronic coumadin therapy  . Sick sinus syndrome     s/p pacemaker  . Chronic kidney disease (CKD), stage III (moderate)   . Anemia     baseline 7.8-9.0  . Coronary artery disease     stent in 2008 shows CABG to LAD, RCA and circumflex with 100% occlusion  . Heart murmur   . Mitral regurgitation     severe MR,EF 30-35%  . Ischemic cardiomyopathy   . Aortic stenosis     severe  . Renal insufficiency, mild   . Family history of anesthesia complication   . Shortness of breath      PSH: Past Surgical History  Procedure Laterality Date  . Exploratory laparotomy w/ bowel resection  2/12    in setting of SBO  . Pacemaker insertion  03/30/2009    [employer]  [facility]  . R breast lumpectomy  05/2004  . Ureterolithotomy  01/2002  . Insert / replace / remove pacemaker    .  Coronary artery bypass graft  1980's  . Flexible sigmoidoscopy N/A 05/15/2013    Procedure: FLEXIBLE SIGMOIDOSCOPY;  Surgeon: Vertell NovakJames L Edwards Jr., MD;  [facility];  Service: Endoscopy;  Laterality: N/A;   I have reviewed the FH and SH and  If appropriate update it with new information. Allergies  Allergen Reactions  . Penicillins Swelling  . Simvastatin Other (See Comments)    Myalgia   . Ace Inhibitors Cough  . Amlodipine Besylate Rash  . Aspirin Nausea Only, Rash and Other (See Comments)    GI distress  . Food Other (See Comments)    Berries,tomato  . Nutritional Supplements Other (See Comments)    Unknown.   Scheduled Meds: . carvedilol  3.125 mg Oral BID WC  . docusate sodium  200 mg Oral Daily  . donepezil  5 mg Oral QHS  . furosemide  80 mg Intravenous BID  . hydrALAZINE  25 mg Oral 3 times per day  . hydrocortisone    Rectal BID  . hydrocortisone-pramoxine  1 applicator Rectal BID  . isosorbide dinitrate  10 mg Oral BID  . pantoprazole  40 mg Oral Daily  . polyethylene glycol  17 g Oral Daily  . potassium chloride  20 mEq Oral BID  . sodium chloride  3 mL Intravenous Q12H   Continuous Infusions:  PRN Meds:.sodium chloride, bisacodyl, morphine injection, sodium chloride    BP 137/87  Pulse 73  Temp(Src) 97.6 F (36.4 C) (Oral)  Resp 20  Ht 5\' 7"  (1.702 m)  Wt 60.6 kg (133 lb 9.6 oz)  BMI 20.92 kg/m2  SpO2 100%   PPS: 30-40 %   Intake/Output Summary (Last 24 hours) at 09/27/13 1425 Last data filed at 09/27/13 0900  Gross per 24 hour  Intake    290 ml  Output    201 ml  Net     89 ml    Physical Exam:  General: awake and alert but hard of hearing, and seemingly confused HEENT:  PERRL , EOMI, anicteric, mmm  Chest:   Decreased some rhonchi, bilateral bases CVS: irregular, S1, S2  3/6 systolic murumur Abdomen:soft, not tender or distended positive bowel sounds Ext: warm, no edema, no mottling Neuro:very hared of hearing, can read lips slightly,  Labs: CBC    Component Value Date/Time   WBC 4.6 09/26/2013 0315   WBC 5.3 05/10/2009 1318   RBC 2.21* 09/26/2013 0315   RBC 3.05* 04/09/2013 1833   RBC 3.45* 05/10/2009 1318   HGB 7.0* 09/26/2013 0315   HGB 11.3* 05/10/2009 1318   HCT 20.3* 09/26/2013 0315   HCT 33.3* 05/10/2009 1318   PLT 217 09/26/2013 0315   PLT 185 05/10/2009 1318   MCV 91.9 09/26/2013 0315   MCV 96.5 05/10/2009 1318   MCH 31.7 09/26/2013 0315   MCH 32.7 05/10/2009 1318   MCHC 34.5 09/26/2013 0315   MCHC 33.8 05/10/2009 1318   RDW 16.4* 09/26/2013 0315   RDW 16.4* 05/10/2009 1318   LYMPHSABS 0.9 05/08/2013 1353   LYMPHSABS 0.9 05/10/2009 1318   MONOABS 0.4 05/08/2013 1353   MONOABS 0.4 05/10/2009 1318   EOSABS 0.1 05/08/2013 1353   EOSABS 0.1 05/10/2009 1318   BASOSABS 0.0 05/08/2013 1353   BASOSABS 0.1 05/10/2009 1318      CMP     Component Value Date/Time    NA 139 09/27/2013 0448   K 4.4 09/27/2013 0448   CL 98 09/27/2013 0448   CO2 23 09/27/2013 0448  GLUCOSE 122* 09/27/2013 0448   BUN 91* 09/27/2013 0448   CREATININE 2.70* 09/27/2013 0448   CREATININE 1.61* 03/24/2013 1524   CALCIUM 9.2 09/27/2013 0448   PROT 6.6 05/08/2013 1954   ALBUMIN 3.7 05/08/2013 1954   AST 31 05/08/2013 1954   ALT 13 05/08/2013 1954   ALKPHOS 183* 05/08/2013 1954   BILITOT 2.1* 05/08/2013 1954   GFRNONAA 14* 09/27/2013 0448   GFRAA 16* 09/27/2013 0448    Chest Xray Reviewed/Impressions: 1. Low lung volumes with evidence to suggest mild congestive heart  failure, as above.  2. Atherosclerosis.       Time In Time Out Total Time Spent with Patient Total Overall Time  1 pm 140 pm 40 min 40 min    Greater than 50%  of this time was spent counseling and coordinating care related to the above assessment and plan.   Brenin Heidelberger L. Ladona Ridgel, MD MBA The Palliative Medicine Team at [facility] Phone: [phone] Pager: [phone]

## 2013-09-27 NOTE — Consult Note (Signed)
Patient IR:[id number] [name]      DOB: 1917/10/02      SAY:[id number]   Summary of Goals of Care; full consult to follow:  Met with patient's nieces Santiago Glad and Angie.   Santiago Glad has acted as Midwife for some time.  Franchon has been in her own home with day help up until this hospital stay.  Family reports they have been giving her medications but noted her condition not responding.    [place] is unable to participate today because her hearing aid batteries are drained and her nieces do not have any with them.    Nieces feel that residential hospice would be appropriate next step as they do not feel that she going to do well at home but they would consider her home with hospice if she can ambulate and help with her care.  Recommend:  1.  DNR affirmed  2.  PT eval in the am to see what her level of function is  3.  Nieces to bring batteries and talk with her about using hospice services.  4.  Will request SW consult in am for offer choice for Hospice Facility transfer.   Total time 1 pm- 140 pm  Demichael Traum L. Lovena Le, MD MBA The Palliative Medicine Team at [facility] Phone: [phone] Pager: [phone]

## 2013-09-28 DIAGNOSIS — Z515 Encounter for palliative care: Secondary | ICD-10-CM

## 2013-09-28 LAB — BASIC METABOLIC PANEL
BUN: 87 mg/dL — ABNORMAL HIGH (ref 6–23)
CO2: 25 meq/L (ref 19–32)
Calcium: 9.2 mg/dL (ref 8.4–10.5)
Chloride: 101 mEq/L (ref 96–112)
Creatinine, Ser: 2.59 mg/dL — ABNORMAL HIGH (ref 0.50–1.10)
GFR calc Af Amer: 17 mL/min — ABNORMAL LOW (ref >90)
GFR calc non Af Amer: 15 mL/min — ABNORMAL LOW (ref >90)
GLUCOSE: 96 mg/dL (ref 70–99)
Potassium: 4.2 mEq/L (ref 3.7–5.3)
SODIUM: 143 meq/L (ref 137–147)

## 2013-09-28 NOTE — Progress Notes (Signed)
CSW spoke with patient's niece Clydie Braun and granddaughter Lynden Ang briefly this morning.  They are meeting this afternoon with Palliative Care at 2 pm. CSW gave name and phone number and offered support; will follow up with patient and family after Palliative Care meeting to determine d/c plan. Home with Hospice vs Residential Hospice Home?  CSW will monitor and assist as needed.  Lorri Frederick. [place]  [phone]

## 2013-09-28 NOTE — Evaluation (Signed)
Physical Therapy Evaluation Patient Details Name: [name] MRN: [id number] DOB: 04/18/1918 Today's Date: 09/28/2013 Time: [id number] PT Time Calculation (min): 23 min  PT Assessment / Plan / Recommendation History of Present Illness  [name] is a 80 year old female who has been gradually deteriorating over the past several weeks. She's been falling frequently. She has moderate to severe aortic stenosis and mitral regurgitation. She has a history of coronary artery disease with remote bypass grafting with 20 years ago. In addition, she has chronic kidney disease. PT consulted for appropriateness of home with hospice support vs. residential hospice.   Clinical Impression  Pt admitted with deconditioning and CHF. Pt currently with functional limitations due to the deficits listed below (see PT Problem List). At the time of PT eval, she was able to perform transfers with min guard and ambulate 25 feet around the room. Communication barrier as pt's hearing aids not working, and could not hear therapist despite multiple attempts with raised voice.  Per chart review, pt/family looking at hospice support at d/c, and feel pt will be appropriate for home with minimal assist and hospice care.     PT Assessment  Patient needs continued PT services    Follow Up Recommendations  Other (comment) (Home with Hospice)    Does the patient have the potential to tolerate intense rehabilitation      Barriers to Discharge        Equipment Recommendations  None recommended by PT    Recommendations for Other Services     Frequency Min 3X/week    Precautions / Restrictions Precautions Precautions: Fall Restrictions Weight Bearing Restrictions: No   Pertinent Vitals/Pain Pt did not report pain throughout session and appeared to be in NAD.       Mobility  Bed Mobility Overal bed mobility: Needs Assistance Bed Mobility: Supine to Sit Supine to sit: Min assist General bed mobility comments:  Minimal support needed to elevate trunk to achieve full sitting position at EOB. Slow at initiating movements as pt unsure of what I wanted her to do at first (hearing aids not working).  Transfers Overall transfer level: Needs assistance Equipment used: Rolling walker (2 wheeled) Transfers: Sit to/from Stand Sit to Stand: Min guard General transfer comment: Pt able to power-up to full stand with close guard. Tactile cueing to push off from bed and reach back for the chair prior to initiating transfers.  Ambulation/Gait Ambulation/Gait assistance: Min guard Ambulation Distance (Feet): 25 Feet Assistive device: Rolling walker (2 wheeled) Gait Pattern/deviations: Step-through pattern;Decreased stride length Gait velocity: Decreased Gait velocity interpretation: Below normal speed for age/gender General Gait Details: Ambulated in room, and was able to negotiate with walker fairly well in tight spaces. No LOB noted. Tactile cueing for improved posture.     Exercises     PT Diagnosis: Generalized weakness  PT Problem List: Decreased strength;Decreased range of motion;Decreased activity tolerance;Decreased balance;Decreased mobility;Decreased knowledge of use of DME;Decreased safety awareness;Decreased knowledge of precautions;Cardiopulmonary status limiting activity PT Treatment Interventions: DME instruction;Gait training;Stair training;Functional mobility training;Therapeutic activities;Therapeutic exercise;Neuromuscular re-education;Patient/family education     PT Goals(Current goals can be found in the care plan section) Acute Rehab PT Goals Patient Stated Goal: None stated PT Goal Formulation: Patient unable to participate in goal setting Time For Goal Achievement: 10/12/13 Potential to Achieve Goals: Fair  Visit Information  Last PT Received On: 09/28/13 Assistance Needed: +1 History of Present Illness: [name] is a 80 year old female who has been gradually deteriorating over  the past several  weeks. She's been falling frequently. She has moderate to severe aortic stenosis and mitral regurgitation. She has a history of coronary artery disease with remote bypass grafting with 20 years ago. In addition, she has chronic kidney disease. PT consulted for appropriateness of home with hospice support vs. residential hospice.        Prior Functioning  Home Living Family/patient expects to be discharged to:: Private residence Living Arrangements: Alone;Other relatives Available Help at Discharge: Family Type of Home: House Home Access: Level entry Home Layout: One level Home Equipment: Cane - single point;Shower seat;Walker - 2 wheels Additional Comments: Home living information input from previous entries. Pt unable to participate in history due to hearing aids not working properly. One with no battery at all, and one appears to be dead.  Prior Function Level of Independence: Independent with assistive device(s) Comments: Input from previous entry. Possibly uses a cane at home? Communication Communication: HOH Dominant Hand: Right    Cognition  Cognition Arousal/Alertness: Lethargic Behavior During Therapy: Flat affect Overall Cognitive Status: No family/caregiver present to determine baseline cognitive functioning    Extremity/Trunk Assessment Upper Extremity Assessment Upper Extremity Assessment: Generalized weakness Lower Extremity Assessment Lower Extremity Assessment: Generalized weakness Cervical / Trunk Assessment Cervical / Trunk Assessment: Kyphotic   Balance Balance Overall balance assessment: Needs assistance;History of Falls Sitting-balance support: Feet supported Sitting balance-Leahy Scale: Good Standing balance support: Bilateral upper extremity supported Standing balance-Leahy Scale: Fair  End of Session PT - End of Session Equipment Utilized During Treatment: Gait belt;Oxygen Activity Tolerance: Patient limited by fatigue Patient left: in  chair;with call bell/phone within reach Nurse Communication: Mobility status  GP     Ruthann Cancer 09/28/2013, 11:08 AM  Ruthann Cancer, PT, DPT Acute Rehabilitation Services Pager: [phone]

## 2013-09-28 NOTE — Progress Notes (Signed)
CSW checked with [facility] and [facility] for availability. Both have waiting lists. Per CM, patient will most likely go home with hospice and if needed, the home hospice workers will help transition patient into a residential facility. CSW signing off at this time. Please re consult if social work needs arise.  Maree Krabbe, MSW, [facility] [phone]

## 2013-09-28 NOTE — Progress Notes (Signed)
PROGRESS NOTE  Subjective:   Mrs. [name] is a 80 year old female who has been gradually deteriorating over the past several weeks. She's been falling frequently.  She has moderate to severe aortic stenosis and mitral regurgitation. She has a history of coronary artery disease with remote bypass grafting with 20 years ago. In addition, she has chronic kidney disease.  She was not able to hear me this am - despite my yelling quite loudly.  Does not appear to be in any pain.  Objective:    Vital Signs:   Temp:  [97.5 F (36.4 C)-98 F (36.7 C)] 98 F (36.7 C) (03/09 0426) Pulse Rate:  [57-75] 75 (03/09 0426) Resp:  [16-20] 16 (03/09 0426) BP: (105-141)/(58-88) 105/58 mmHg (03/09 0426) SpO2:  [98 %-100 %] 98 % (03/09 0426) Weight:  [130 lb 15.3 oz (59.4 kg)] 130 lb 15.3 oz (59.4 kg) (03/09 0426)  Last BM Date: 09/27/13   24-hour weight change: Weight change:   Weight trends: Filed Weights   09/25/13 0606 09/26/13 0458 09/28/13 0426  Weight: 132 lb 7.9 oz (60.1 kg) 133 lb 9.6 oz (60.6 kg) 130 lb 15.3 oz (59.4 kg)    Intake/Output:  03/08 0701 - 03/09 0700 In: 240 [P.O.:240] Out: 750 [Urine:750] Total I/O In: -  Out: 100 [Urine:100]   Physical Exam: BP 105/58  Pulse 75  Temp(Src) 98 F (36.7 C) (Oral)  Resp 16  Ht 5\' 7"  (1.702 m)  Wt 130 lb 15.3 oz (59.4 kg)  BMI 20.51 kg/m2  SpO2 98%  Wt Readings from Last 3 Encounters:  09/28/13 130 lb 15.3 oz (59.4 kg)  09/22/13 139 lb (63.05 kg)  08/27/13 137 lb 9.6 oz (62.415 kg)    General: Vital signs reviewed and noted. , would look at me but would not speak, appears to be very weak  Head: Normocephalic, atraumatic.  Eyes: conjunctivae/corneas clear.  EOM's intact.   Throat: normal  Neck:  increase JVD  Lungs:  bilateral rhonchi, rales  Heart:  IRRG, , + 3/6 systolic murmur  Abdomen:  Soft, non-tender, non-distended    Extremities: No edema   Neurologic:  deaf  Psych:      Labs: BMET:  Recent Labs  09/27/13 0448 09/28/13 0700  NA 139 143  K 4.4 4.2  CL 98 101  CO2 23 25  GLUCOSE 122* 96  BUN 91* 87*  CREATININE 2.70* 2.59*  CALCIUM 9.2 9.2    Liver function tests: No results found for this basename: AST, ALT, ALKPHOS, BILITOT, PROT, ALBUMIN,  in the last 72 hours No results found for this basename: LIPASE, AMYLASE,  in the last 72 hours  CBC:  Recent Labs  09/26/13 0315  WBC 4.6  HGB 7.0*  HCT 20.3*  MCV 91.9  PLT 217    Cardiac Enzymes: No results found for this basename: CKTOTAL, CKMB, TROPONINI,  in the last 72 hours  Coagulation Studies: No results found for this basename: LABPROT, INR,  in the last 72 hours   Other results:  EKG   Medications:    Infusions:    Scheduled Medications: . carvedilol  3.125 mg Oral BID WC  . docusate sodium  200 mg Oral Daily  . donepezil  5 mg Oral QHS  . furosemide  80 mg Intravenous BID  . hydrALAZINE  25 mg Oral 3 times per day  . hydrocortisone   Rectal BID  . hydrocortisone-pramoxine  1 applicator Rectal BID  . isosorbide dinitrate  10 mg Oral BID  . pantoprazole  40 mg Oral Daily  . polyethylene glycol  17 g Oral Daily  . potassium chloride  20 mEq Oral BID  . sodium chloride  3 mL Intravenous Q12H    Assessment/ Plan:    1. Chronic atrial fibrillation: 2. Chronic diastolic congestive heart failure 3. Coronary artery disease:    4. Aortic stenosis:    5. Status post pacemaker implant:    6. Chronic renal insufficiency-stage IV .- her creatinine is 2.59   this appears to be baseline. 7. Anemia:  Her hemoglobin today is 7.0. His follow him from 9.1 last October.  She has numerous medical issues - I'm not sure that she has responded to our therapy yet.   I agree with consulting Hospice and Palliative care.    She has been seen by Dr. Ladona Ridgel of Hospice and Palliative care.     I would request transfer to the hospice service - either home or in hospital.  I do not think that we have much to add from a  cardiology standpoint.  Disposition:  Length of Stay: 6  [name], [name]., MD, [facility]   09/28/2013, 9:27 AM Office [phone] Pager [phone]

## 2013-09-28 NOTE — Progress Notes (Signed)
Patient HO:[id number] [name]      DOB: 25-May-1918      OIB:[id number]   Palliative Medicine Team at [facility] Progress Note    Subjective: Patient able to hear now with new batteries.  Reality, however, is that I don't think that she has full capacity for decision making . In general, she does not want to participate in conversations about her health .  She defers to her neices and when asked. Discussed options with family to start at home with hospice and transition to hospice facility when bed avialbe.     Filed Vitals:   09/28/13 1415  BP: 97/57  Pulse: 61  Temp: 98.3 F (36.8 C)  Resp: 18   Physical exam:  General: no acute distress PERRL, EOMI, anicteric, mm m Chest decreased with no crackles CVS:  Regular rate and rhtymt ABd: soft, not tender  Ext warm, she reports numbness in right arm Neuro: hard of hearing,  No acute distress   Assessment and plan: 80 yr old african [place] female with end stage cardiac disease and valvular disease that can not have surgery.   Family desires hospice care.  I believe [name] will decline quickly and so are options are home with movement to hospice home when bed available vs consider alterative hospice.  1.  DNR 2.  CHF: optimize medical management 3.  Hospice referral  Prognosis: weeks- days  Total time 30 min  [name] L. Ladona Ridgel, MD MBA The Palliative Medicine Team at [facility] Phone: [phone] Pager: [phone]

## 2013-09-29 LAB — BASIC METABOLIC PANEL
BUN: 84 mg/dL — AB (ref 6–23)
CO2: 26 mEq/L (ref 19–32)
Calcium: 8.8 mg/dL (ref 8.4–10.5)
Chloride: 102 mEq/L (ref 96–112)
Creatinine, Ser: 2.5 mg/dL — ABNORMAL HIGH (ref 0.50–1.10)
GFR calc non Af Amer: 15 mL/min — ABNORMAL LOW (ref >90)
GFR, EST AFRICAN AMERICAN: 18 mL/min — AB (ref >90)
Glucose, Bld: 88 mg/dL (ref 70–99)
POTASSIUM: 4.4 meq/L (ref 3.7–5.3)
Sodium: 143 mEq/L (ref 137–147)

## 2013-09-29 NOTE — Progress Notes (Addendum)
Pt BP this am at 0700 130/70, night nurse gave pt her coreg and hydralazine as scheduled. BP at 0807 111/63,HR 56, did not give IV lasix at this  time, will recheck BP,  At 1025    BP 95/62. Did not give IV lasix at this time either due to low BP.  At 1317 BP remains low, held hydralazine at this time.    MD already aware meds being held at times for low BP. Planning d/c 3/11 home with hospice care per notes.

## 2013-09-29 NOTE — Progress Notes (Signed)
Talked with niece to determine equpment needs - [employer] will arrange delivery this afternoon.  Pt already on home O2 through [facility].  Notified [facility] [place] of planned d/c 3/11.

## 2013-09-29 NOTE — Progress Notes (Signed)
PROGRESS NOTE  Subjective:   [name] is a 80 year old female who has been gradually deteriorating over the past several weeks. She's been falling frequently.  She has moderate to severe aortic stenosis and mitral regurgitation. She has a history of coronary artery disease with remote bypass grafting with 20 years ago. In addition, she has chronic kidney disease.  She had some hypotensive last night, her lasix and coreg were held.   Objective:    Vital Signs:   Temp:  [97.5 F (36.4 C)-98.3 F (36.8 C)] 97.5 F (36.4 C) (03/10 0703) Pulse Rate:  [56-78] 56 (03/10 0807) Resp:  [18] 18 (03/10 0703) BP: (94-130)/(52-70) 111/63 mmHg (03/10 0807) SpO2:  [96 %-100 %] 100 % (03/10 0703) Weight:  [131 lb 2.8 oz (59.5 kg)] 131 lb 2.8 oz (59.5 kg) (03/10 0300)  Last BM Date: 09/26/13   24-hour weight change: Weight change: 3.5 oz (0.1 kg)  Weight trends: Filed Weights   09/26/13 0458 09/28/13 0426 09/29/13 0300  Weight: 133 lb 9.6 oz (60.6 kg) 130 lb 15.3 oz (59.4 kg) 131 lb 2.8 oz (59.5 kg)    Intake/Output:  03/09 0701 - 03/10 0700 In: 240 [P.O.:240] Out: 550 [Urine:550]     Physical Exam: BP 111/63  Pulse 56  Temp(Src) 97.5 F (36.4 C) (Oral)  Resp 18  Ht 5\' 7"  (1.702 m)  Wt 131 lb 2.8 oz (59.5 kg)  BMI 20.54 kg/m2  SpO2 100%  Wt Readings from Last 3 Encounters:  09/29/13 131 lb 2.8 oz (59.5 kg)  09/22/13 139 lb (63.05 kg)  08/27/13 137 lb 9.6 oz (62.415 kg)    General: Vital signs reviewed and noted. , would look at me but would not speak, appears to be very weak  Head: Normocephalic, atraumatic.  Eyes: conjunctivae/corneas clear.  EOM's intact.   Throat: normal  Neck:  increase JVD  Lungs:  bilateral rhonchi, rales  Heart:  IRRG, , + 3/6 systolic murmur  Abdomen:  Soft, non-tender, non-distended    Extremities: No edema   Neurologic:  deaf  Psych:      Labs: BMET:  Recent Labs  09/28/13 0700 09/29/13 0420  NA 143 143  K 4.2 4.4  CL  101 102  CO2 25 26  GLUCOSE 96 88  BUN 87* 84*  CREATININE 2.59* 2.50*  CALCIUM 9.2 8.8    Liver function tests: No results found for this basename: AST, ALT, ALKPHOS, BILITOT, PROT, ALBUMIN,  in the last 72 hours No results found for this basename: LIPASE, AMYLASE,  in the last 72 hours  CBC: No results found for this basename: WBC, NEUTROABS, HGB, HCT, MCV, PLT,  in the last 72 hours  Cardiac Enzymes: No results found for this basename: CKTOTAL, CKMB, TROPONINI,  in the last 72 hours  Coagulation Studies: No results found for this basename: LABPROT, INR,  in the last 72 hours   Other results:  EKG   Medications:    Infusions:    Scheduled Medications: . carvedilol  3.125 mg Oral BID WC  . docusate sodium  200 mg Oral Daily  . donepezil  5 mg Oral QHS  . furosemide  80 mg Intravenous BID  . hydrALAZINE  25 mg Oral 3 times per day  . hydrocortisone   Rectal BID  . hydrocortisone-pramoxine  1 applicator Rectal BID  . isosorbide dinitrate  10 mg Oral BID  . pantoprazole  40 mg Oral Daily  . polyethylene glycol  17  g Oral Daily  . potassium chloride  20 mEq Oral BID  . sodium chloride  3 mL Intravenous Q12H    Assessment/ Plan:    1. Chronic atrial fibrillation: 2. Chronic diastolic congestive heart failure 3. Coronary artery disease:    4. Aortic stenosis:    5. Status post pacemaker implant:    6. Chronic renal insufficiency-stage IV .- her creatinine is 2.59   this appears to be baseline. 7. Anemia:  Her hemoglobin today is 7.0. His follow him from 9.1 last October.  She has numerous medical issues - I'm not sure that she has responded to our therapy yet.   I agree with consulting Hospice and Palliative care.    The family has decided to take her home.  Will DC to home once home hospice can be arranged.   Disposition:  Length of Stay: 7  Vesta MixerPhilip J. Nahser, [name], MD, [facility]   09/29/2013, 8:38 AM Office [phone] Pager [phone]

## 2013-09-29 NOTE — Progress Notes (Signed)
BP 114/68, BP remains low, will give coreg at this time, hold on lasix at this time so not to bottom BP out.

## 2013-09-29 NOTE — Care Management Note (Signed)
  Page 1 of 1   09/29/2013     8:37:32 AM   CARE MANAGEMENT NOTE 09/29/2013  Patient:  [name]   Account Number:  [id number]  Date Initiated:  09/28/2013  Documentation initiated by:  Aldous Housel  Subjective/Objective Assessment:   dx diastolic failure; lives alone with paid caregiver; supportive niece and granddaughter.    PCP  Dr Tracey Harries      DC Planning Services  CM consult      Choice offered to / List presented to:  C-2 HC POA / Guardian    HH arranged  HH-1 RN  HH-6 SOCIAL WORKER      HH agency  [facility] [place]   Comments:  09/28/13 1002 Franck Vinal RN MSN BSN CCM S/P palliative care mtg, PT to eval; pt to transfer to residential hospice facility or home with hospice. 1435 Per PT, pt safe to d/c home with hospice, will depend on whether 24/7 assistance can be arranged for pt.  Niece and granddaughter to decide on disposition based on their ability to provide care. 1600 Pt and family have chosen to d/c home with hospice and will transition to residential hospice from there.  List of agencies provided, referral made per choice.

## 2013-09-30 LAB — BASIC METABOLIC PANEL
BUN: 80 mg/dL — ABNORMAL HIGH (ref 6–23)
CO2: 25 meq/L (ref 19–32)
CREATININE: 2.41 mg/dL — AB (ref 0.50–1.10)
Calcium: 8.8 mg/dL (ref 8.4–10.5)
Chloride: 102 mEq/L (ref 96–112)
GFR calc Af Amer: 19 mL/min — ABNORMAL LOW (ref >90)
GFR calc non Af Amer: 16 mL/min — ABNORMAL LOW (ref >90)
Glucose, Bld: 85 mg/dL (ref 70–99)
POTASSIUM: 5.1 meq/L (ref 3.7–5.3)
Sodium: 142 mEq/L (ref 137–147)

## 2013-09-30 MED ORDER — POTASSIUM CHLORIDE CRYS ER 20 MEQ PO TBCR: 20.0000 meq | EXTENDED_RELEASE_TABLET | Freq: Two times a day (BID) | ORAL | Status: DC

## 2013-09-30 MED ORDER — DSS 100 MG PO CAPS: 200.0000 mg | ORAL_CAPSULE | Freq: Every day | ORAL | Status: AC

## 2013-09-30 MED ORDER — FUROSEMIDE 80 MG PO TABS
80.0000 mg | ORAL_TABLET | Freq: Two times a day (BID) | ORAL | Status: DC
  Administered 2013-09-30: 80 mg via ORAL
  Filled 2013-09-30 (×3): qty 1

## 2013-09-30 MED ORDER — FUROSEMIDE 80 MG PO TABS: 80.0000 mg | ORAL_TABLET | Freq: Two times a day (BID) | ORAL | Status: DC

## 2013-09-30 MED ORDER — HYDRALAZINE HCL 25 MG PO TABS: 25.0000 mg | ORAL_TABLET | Freq: Three times a day (TID) | ORAL | Status: AC

## 2013-09-30 NOTE — Discharge Summary (Signed)
Physician Discharge Summary  Patient ID: CHILD BEDWARD MRN: [id number] DOB/AGE: 1918/04/21 80 y.o.  Admit date: 09/22/2013 Discharge date: 09/30/2013  Admission Diagnoses: Acute on Chronic Diastolic Heart Failure  Discharge Diagnoses:  Principal Problem:   Acute on chronic diastolic heart failure Active Problems:   Coronary artery disease - S/P remote CABG x 4 (1980's)   Atrial fibrillation, permanent   Chronic diastolic heart failure   Aortic stenosis- moderate to moderate/ severe 04/11/13   Pacemaker - St. Jude, dual ch. programmed VVIR   Chronic renal insufficiency, stage III (moderate)   Discharged Condition: stable  Hospital Course: Ms. Carruthers is a 80 y/o female, followed by Dr. Royann Shivers, who was admitted to [facility] on 09/22/13 for A/C diastolic HF. She has moderate to severe aortic valve stenosis and mitral valve regurgitation. Has a history of coronary disease with a remote bypass procedure more than 20 years ago. In addition she has moderate to severe chronic kidney disease with a estimated GFR of 25-30 mL per minute (Creatinine 1.6, seems to be her baseline). She also has chronic atrial fibrillation and has a PPM.   She was seen by Wilburt Finlay, PA-C, in the office on day of admission. She was noted to have weight gain, severe lower extremity edema, JVD, abdominal distension and orthopnea. It should also be noted that she has continued to gradually deteriorate. She is less energetic than before and spends a large part of the day sleeping. Based on her presentation in the office, it was felt that she would require IV diuresis to alleviate her symptoms. It was also felt that due to her gradual severe deterioration, that hospice referral was most appropriate. She was admitted to [facility]. Palliative Care was consulted. The patient was evaluated by Dr. Derenda Mis. A family meeting was held and it was decided to pursue home Hospice and transition to a Hospice facility once a bed became  available. After treatment with IV Lasix, she was transitioned to PO. On hospital day 8 she was seen by Dr. Elease Hashimoto who determined she was stable for discharge home. Home Hospice has been arranged and supplies are scheduled to be delivered to the patient's home on 09/30/13.   The following recommendations were provided: She should hold the cardiac meds ( coreg, lasix, Isordil, hydranazine) for systolic BP of < 957.    Consults: palliative care  Significant Diagnostic Studies: none  Treatments: See Hospital Course  Discharge Exam: Blood pressure 127/69, pulse 80, temperature 98.1 F (36.7 C), temperature source Oral, resp. rate 18, height 5\' 7"  (1.702 m), weight 129 lb 13.6 oz (58.9 kg), SpO2 98.00%.   Disposition: 03-Skilled Nursing Facility      Discharge Orders   Future Orders Complete By Expires   Diet - low sodium heart healthy  As directed    Increase activity slowly  As directed        Medication List    STOP taking these medications       aspirin 81 MG tablet     ezetimibe 10 MG tablet  Commonly known as:  ZETIA     metolazone 2.5 MG tablet  Commonly known as:  ZAROXOLYN      TAKE these medications       carvedilol 3.125 MG tablet  Commonly known as:  COREG  Take 3.125 mg by mouth 2 (two) times daily.     donepezil 5 MG tablet  Commonly known as:  ARICEPT  Take 5 mg by mouth at bedtime.  DSS 100 MG Caps  Take 200 mg by mouth daily.     furosemide 80 MG tablet  Commonly known as:  LASIX  Take 1 tablet (80 mg total) by mouth 2 (two) times daily.     hydrALAZINE 25 MG tablet  Commonly known as:  APRESOLINE  Take 1 tablet (25 mg total) by mouth 3 (three) times daily.     hydrocortisone 2.5 % rectal cream  Commonly known as:  PROCTOSOL HC  Place rectally 2 (two) times daily.     hydrocortisone-pramoxine rectal foam  Commonly known as:  PROCTOFOAM-HC  Place 1 applicator rectally 2 (two) times daily.     isosorbide dinitrate 10 MG tablet    Commonly known as:  ISORDIL  Take 1 tablet (10 mg total) by mouth 2 (two) times daily.     LORazepam 0.5 MG tablet  Commonly known as:  ATIVAN  Take 0.5 mg by mouth every 8 (eight) hours as needed for anxiety.     LORazepam 0.5 MG tablet  Commonly known as:  ATIVAN  TAKE 1 TABLET BY MOUTH EVERY 8 HOURS AS DIRECTED     nitroGLYCERIN 0.4 MG SL tablet  Commonly known as:  NITROSTAT  Place 1 tablet (0.4 mg total) under the tongue every 5 (five) minutes as needed for chest pain.     pantoprazole 40 MG tablet  Commonly known as:  PROTONIX  Take 40 mg by mouth daily.     polyethylene glycol packet  Commonly known as:  MIRALAX / GLYCOLAX  Take 17 g by mouth 2 (two) times daily as needed.     potassium chloride SA 20 MEQ tablet  Commonly known as:  KLOR-CON M10  Take 1 tablet (20 mEq total) by mouth 2 (two) times daily.       TIME SPENT ON DISCHARGE, INCLUDING PHYSICIAN TIME: > 30 MINUTES  Signed: SIMMONS, BRITTAINY 09/30/2013, 11:21 AM  Attending Note:   The patient was seen and examined.  Agree with assessment and plan as noted above.  Changes made to the above note as needed.  [facility]  Her prognosis is poor.  She will follow up with her primary medical doctor   Vesta MixerPhilip J. Charleen Madera, [name], MD, [facility]usFACC 09/30/2013, 5:23 PM

## 2013-09-30 NOTE — Progress Notes (Signed)
PROGRESS NOTE  Subjective:   [name] is a 80 year old female who has been gradually deteriorating over the past several weeks. She's been falling frequently.  She has moderate to severe aortic stenosis and mitral regurgitation. She has a history of coronary artery disease with remote bypass grafting with 20 years ago. In addition, she has chronic kidney disease.  We have had difficulty in keeping her on meds due to hypotension. Her appetite remains poor.   Objective:    Vital Signs:   Temp:  [97.6 F (36.4 C)-98.1 F (36.7 C)] 98.1 F (36.7 C) (03/11 0540) Pulse Rate:  [56-80] 80 (03/11 0540) Resp:  [18] 18 (03/11 0540) BP: (95-127)/(57-73) 127/69 mmHg (03/11 0540) SpO2:  [98 %-100 %] 98 % (03/11 0540) Weight:  [129 lb 13.6 oz (58.9 kg)] 129 lb 13.6 oz (58.9 kg) (03/11 0540)  Last BM Date: 09/26/13   24-hour weight change: Weight change: -1 lb 5.2 oz (-0.6 kg)  Weight trends: Filed Weights   09/28/13 0426 09/29/13 0300 09/30/13 0540  Weight: 130 lb 15.3 oz (59.4 kg) 131 lb 2.8 oz (59.5 kg) 129 lb 13.6 oz (58.9 kg)    Intake/Output:  03/10 0701 - 03/11 0700 In: 840 [P.O.:840] Out: 1050 [Urine:1050]     Physical Exam: BP 127/69  Pulse 80  Temp(Src) 98.1 F (36.7 C) (Oral)  Resp 18  Ht 5\' 7"  (1.702 m)  Wt 129 lb 13.6 oz (58.9 kg)  BMI 20.33 kg/m2  SpO2 98%  Wt Readings from Last 3 Encounters:  09/30/13 129 lb 13.6 oz (58.9 kg)  09/22/13 139 lb (63.05 kg)  08/27/13 137 lb 9.6 oz (62.415 kg)    General: Vital signs reviewed and noted. , would look at me but would not speak, appears to be very weak  Head: Normocephalic, atraumatic.  Eyes: conjunctivae/corneas clear.  EOM's intact.   Throat: normal  Neck:  increase JVD  Lungs:  bilateral rhonchi, rales  Heart:  IRRG, , + 3/6 systolic murmur  Abdomen:  Soft, non-tender, non-distended    Extremities: No edema   Neurologic:  deaf, more responsive today.    Psych:      Labs: BMET:  Recent Labs  09/29/13 0420 09/30/13 0450  NA 143 142  K 4.4 5.1  CL 102 102  CO2 26 25  GLUCOSE 88 85  BUN 84* 80*  CREATININE 2.50* 2.41*  CALCIUM 8.8 8.8    Liver function tests: No results found for this basename: AST, ALT, ALKPHOS, BILITOT, PROT, ALBUMIN,  in the last 72 hours No results found for this basename: LIPASE, AMYLASE,  in the last 72 hours  CBC: No results found for this basename: WBC, NEUTROABS, HGB, HCT, MCV, PLT,  in the last 72 hours  Cardiac Enzymes: No results found for this basename: CKTOTAL, CKMB, TROPONINI,  in the last 72 hours  Coagulation Studies: No results found for this basename: LABPROT, INR,  in the last 72 hours   Other results:  EKG   Medications:    Infusions:    Scheduled Medications: . carvedilol  3.125 mg Oral BID WC  . docusate sodium  200 mg Oral Daily  . donepezil  5 mg Oral QHS  . furosemide  80 mg Intravenous BID  . hydrALAZINE  25 mg Oral 3 times per day  . hydrocortisone   Rectal BID  . hydrocortisone-pramoxine  1 applicator Rectal BID  . isosorbide dinitrate  10 mg Oral BID  . pantoprazole  40 mg Oral  Daily  . polyethylene glycol  17 g Oral Daily  . potassium chloride  20 mEq Oral BID  . sodium chloride  3 mL Intravenous Q12H    Assessment/ Plan:    1. Chronic atrial fibrillation: 2. Chronic diastolic congestive heart failure 3. Coronary artery disease:    4. Aortic stenosis:    5. Status post pacemaker implant:    6. Chronic renal insufficiency-stage IV .- her creatinine is 2.41.   this appears to be baseline. 7. Anemia:  Her hemoglobin today is 7.0. His follow him from 9.1 last October.  She has numerous medical issues - I'm not sure that she has responded to our therapy yet.   I agree with consulting Hospice and Palliative care.    The family has decided to take her home.  anticpiate DC today  The attending once she goes home should be the hospice attending or her primary medical doctor.  Continue current meds  I  have changed her lasix to PO. She should hold the cardiac meds ( coreg, lasix, Isordil,  hydranazine)  for systolic BP of < 161100.   Disposition:  Length of Stay: 8  Vesta MixerPhilip J. Nahser, [name], MD, [facility] HospitalFACC   09/30/2013, 7:52 AM Office [phone] Pager [phone]

## 2013-09-30 NOTE — Progress Notes (Addendum)
2:12 pm: Clinical Social Worker facilitated patient discharge by contacting the family and informing that Child psychotherapist called transport. Patient's niece agreeable to this plan and arranging transport via EMS . CSW will sign off, as social work intervention is no longer needed.   Update: CSW awaiting DNR form to be filled out. CSW and RN paged PA and Palliative team. Awaiting to hear back before setting up transport.  CSW called patient's niece to confirm address of where patient will be going. CSW will be setting up EMS transport for patient back home today. Niece agreeable to plan and stating the patient's bed should be delivered by 12pm.  Maree Krabbe, MSW, [facility] [phone]

## 2013-11-01 ENCOUNTER — Emergency Department (HOSPITAL_COMMUNITY)

## 2013-11-01 ENCOUNTER — Inpatient Hospital Stay (HOSPITAL_COMMUNITY)
Admission: EM | Admit: 2013-11-01 | Discharge: 2013-11-04 | DRG: 682 | Disposition: A | Discharge status: Hospice | Attending: Internal Medicine | Admitting: Internal Medicine

## 2013-11-01 ENCOUNTER — Encounter (HOSPITAL_COMMUNITY): Payer: Self-pay | Admitting: Emergency Medicine

## 2013-11-01 DIAGNOSIS — I4821 Permanent atrial fibrillation: Secondary | ICD-10-CM | POA: Diagnosis present

## 2013-11-01 DIAGNOSIS — I4891 Unspecified atrial fibrillation: Secondary | ICD-10-CM | POA: Diagnosis present

## 2013-11-01 DIAGNOSIS — Z79899 Other long term (current) drug therapy: Secondary | ICD-10-CM

## 2013-11-01 DIAGNOSIS — N289 Disorder of kidney and ureter, unspecified: Secondary | ICD-10-CM

## 2013-11-01 DIAGNOSIS — I5033 Acute on chronic diastolic (congestive) heart failure: Secondary | ICD-10-CM | POA: Diagnosis present

## 2013-11-01 DIAGNOSIS — Z950 Presence of cardiac pacemaker: Secondary | ICD-10-CM | POA: Diagnosis present

## 2013-11-01 DIAGNOSIS — I509 Heart failure, unspecified: Secondary | ICD-10-CM | POA: Diagnosis present

## 2013-11-01 DIAGNOSIS — I340 Nonrheumatic mitral (valve) insufficiency: Secondary | ICD-10-CM

## 2013-11-01 DIAGNOSIS — R112 Nausea with vomiting, unspecified: Secondary | ICD-10-CM | POA: Diagnosis present

## 2013-11-01 DIAGNOSIS — I359 Nonrheumatic aortic valve disorder, unspecified: Secondary | ICD-10-CM | POA: Diagnosis present

## 2013-11-01 DIAGNOSIS — R259 Unspecified abnormal involuntary movements: Secondary | ICD-10-CM

## 2013-11-01 DIAGNOSIS — I498 Other specified cardiac arrhythmias: Secondary | ICD-10-CM

## 2013-11-01 DIAGNOSIS — K625 Hemorrhage of anus and rectum: Secondary | ICD-10-CM

## 2013-11-01 DIAGNOSIS — I5032 Chronic diastolic (congestive) heart failure: Secondary | ICD-10-CM

## 2013-11-01 DIAGNOSIS — E860 Dehydration: Secondary | ICD-10-CM | POA: Diagnosis present

## 2013-11-01 DIAGNOSIS — I4729 Other ventricular tachycardia: Secondary | ICD-10-CM

## 2013-11-01 DIAGNOSIS — R188 Other ascites: Secondary | ICD-10-CM | POA: Diagnosis present

## 2013-11-01 DIAGNOSIS — N183 Chronic kidney disease, stage 3 unspecified: Secondary | ICD-10-CM | POA: Diagnosis present

## 2013-11-01 DIAGNOSIS — I472 Ventricular tachycardia: Secondary | ICD-10-CM

## 2013-11-01 DIAGNOSIS — K624 Stenosis of anus and rectum: Secondary | ICD-10-CM

## 2013-11-01 DIAGNOSIS — Z66 Do not resuscitate: Secondary | ICD-10-CM | POA: Diagnosis present

## 2013-11-01 DIAGNOSIS — N189 Chronic kidney disease, unspecified: Secondary | ICD-10-CM

## 2013-11-01 DIAGNOSIS — Z888 Allergy status to other drugs, medicaments and biological substances status: Secondary | ICD-10-CM

## 2013-11-01 DIAGNOSIS — Z7901 Long term (current) use of anticoagulants: Secondary | ICD-10-CM

## 2013-11-01 DIAGNOSIS — D649 Anemia, unspecified: Secondary | ICD-10-CM

## 2013-11-01 DIAGNOSIS — R197 Diarrhea, unspecified: Secondary | ICD-10-CM | POA: Diagnosis present

## 2013-11-01 DIAGNOSIS — R141 Gas pain: Secondary | ICD-10-CM | POA: Diagnosis present

## 2013-11-01 DIAGNOSIS — R143 Flatulence: Secondary | ICD-10-CM

## 2013-11-01 DIAGNOSIS — R142 Eructation: Secondary | ICD-10-CM | POA: Diagnosis present

## 2013-11-01 DIAGNOSIS — Z515 Encounter for palliative care: Secondary | ICD-10-CM

## 2013-11-01 DIAGNOSIS — Z951 Presence of aortocoronary bypass graft: Secondary | ICD-10-CM

## 2013-11-01 DIAGNOSIS — H9190 Unspecified hearing loss, unspecified ear: Secondary | ICD-10-CM | POA: Diagnosis present

## 2013-11-01 DIAGNOSIS — I350 Nonrheumatic aortic (valve) stenosis: Secondary | ICD-10-CM | POA: Diagnosis present

## 2013-11-01 DIAGNOSIS — I428 Other cardiomyopathies: Secondary | ICD-10-CM

## 2013-11-01 DIAGNOSIS — N179 Acute kidney failure, unspecified: Principal | ICD-10-CM | POA: Diagnosis present

## 2013-11-01 DIAGNOSIS — Z880 Allergy status to penicillin: Secondary | ICD-10-CM

## 2013-11-01 DIAGNOSIS — I129 Hypertensive chronic kidney disease with stage 1 through stage 4 chronic kidney disease, or unspecified chronic kidney disease: Secondary | ICD-10-CM | POA: Diagnosis present

## 2013-11-01 DIAGNOSIS — R079 Chest pain, unspecified: Secondary | ICD-10-CM

## 2013-11-01 DIAGNOSIS — I495 Sick sinus syndrome: Secondary | ICD-10-CM

## 2013-11-01 DIAGNOSIS — I2510 Atherosclerotic heart disease of native coronary artery without angina pectoris: Secondary | ICD-10-CM | POA: Diagnosis present

## 2013-11-01 DIAGNOSIS — R1110 Vomiting, unspecified: Secondary | ICD-10-CM | POA: Diagnosis present

## 2013-11-01 HISTORY — DX: Cardiac arrhythmia, unspecified: I49.9

## 2013-11-01 HISTORY — DX: Personal history of other diseases of the digestive system: Z87.19

## 2013-11-01 HISTORY — DX: Presence of cardiac pacemaker: Z95.0

## 2013-11-01 LAB — URINALYSIS, ROUTINE W REFLEX MICROSCOPIC
BILIRUBIN URINE: NEGATIVE
Glucose, UA: NEGATIVE mg/dL
Hgb urine dipstick: NEGATIVE
KETONES UR: NEGATIVE mg/dL
LEUKOCYTES UA: NEGATIVE
NITRITE: NEGATIVE
PROTEIN: NEGATIVE mg/dL
Specific Gravity, Urine: 1.016 (ref 1.005–1.030)
Urobilinogen, UA: 0.2 mg/dL (ref 0.0–1.0)
pH: 5 (ref 5.0–8.0)

## 2013-11-01 LAB — CBC
HCT: 25.8 % — ABNORMAL LOW (ref 36.0–46.0)
HEMOGLOBIN: 8.6 g/dL — AB (ref 12.0–15.0)
MCH: 29.9 pg (ref 26.0–34.0)
MCHC: 33.3 g/dL (ref 30.0–36.0)
MCV: 89.6 fL (ref 78.0–100.0)
PLATELETS: 295 10*3/uL (ref 150–400)
RBC: 2.88 MIL/uL — AB (ref 3.87–5.11)
RDW: 16.4 % — ABNORMAL HIGH (ref 11.5–15.5)
WBC: 3.9 10*3/uL — AB (ref 4.0–10.5)

## 2013-11-01 LAB — POC OCCULT BLOOD, ED: FECAL OCCULT BLD: NEGATIVE

## 2013-11-01 LAB — COMPREHENSIVE METABOLIC PANEL
ALK PHOS: 122 U/L — AB (ref 39–117)
ALT: 7 U/L (ref 0–35)
AST: 19 U/L (ref 0–37)
Albumin: 3.6 g/dL (ref 3.5–5.2)
BUN: 64 mg/dL — ABNORMAL HIGH (ref 6–23)
CALCIUM: 8.8 mg/dL (ref 8.4–10.5)
CHLORIDE: 101 meq/L (ref 96–112)
CO2: 20 meq/L (ref 19–32)
Creatinine, Ser: 3.2 mg/dL — ABNORMAL HIGH (ref 0.50–1.10)
GFR calc Af Amer: 13 mL/min — ABNORMAL LOW (ref >90)
GFR, EST NON AFRICAN AMERICAN: 11 mL/min — AB (ref >90)
Glucose, Bld: 118 mg/dL — ABNORMAL HIGH (ref 70–99)
POTASSIUM: 4 meq/L (ref 3.7–5.3)
SODIUM: 141 meq/L (ref 137–147)
Total Bilirubin: 1.2 mg/dL (ref 0.3–1.2)
Total Protein: 6.7 g/dL (ref 6.0–8.3)

## 2013-11-01 LAB — CREATININE, SERUM
CREATININE: 3.02 mg/dL — AB (ref 0.50–1.10)
GFR calc non Af Amer: 12 mL/min — ABNORMAL LOW (ref >90)
GFR, EST AFRICAN AMERICAN: 14 mL/min — AB (ref >90)

## 2013-11-01 LAB — CBC WITH DIFFERENTIAL/PLATELET
BASOS PCT: 1 % (ref 0–1)
Basophils Absolute: 0 10*3/uL (ref 0.0–0.1)
EOS ABS: 0.1 10*3/uL (ref 0.0–0.7)
Eosinophils Relative: 3 % (ref 0–5)
HCT: 25.6 % — ABNORMAL LOW (ref 36.0–46.0)
Hemoglobin: 8.7 g/dL — ABNORMAL LOW (ref 12.0–15.0)
Lymphocytes Relative: 23 % (ref 12–46)
Lymphs Abs: 0.8 10*3/uL (ref 0.7–4.0)
MCH: 30.6 pg (ref 26.0–34.0)
MCHC: 34 g/dL (ref 30.0–36.0)
MCV: 90.1 fL (ref 78.0–100.0)
Monocytes Absolute: 0.4 10*3/uL (ref 0.1–1.0)
Monocytes Relative: 11 % (ref 3–12)
NEUTROS ABS: 2.3 10*3/uL (ref 1.7–7.7)
NEUTROS PCT: 62 % (ref 43–77)
Platelets: 269 10*3/uL (ref 150–400)
RBC: 2.84 MIL/uL — ABNORMAL LOW (ref 3.87–5.11)
RDW: 16.5 % — AB (ref 11.5–15.5)
WBC: 3.6 10*3/uL — ABNORMAL LOW (ref 4.0–10.5)

## 2013-11-01 LAB — PROTIME-INR
INR: 1.21 (ref 0.00–1.49)
Prothrombin Time: 15 seconds (ref 11.6–15.2)

## 2013-11-01 MED ORDER — NITROGLYCERIN 0.4 MG SL SUBL: 0.4000 mg | SUBLINGUAL_TABLET | SUBLINGUAL | Status: DC | PRN

## 2013-11-01 MED ORDER — LORAZEPAM 2 MG/ML IJ SOLN: 1.0000 mg | Freq: Four times a day (QID) | INTRAMUSCULAR | Status: DC | PRN

## 2013-11-01 MED ORDER — ISOSORBIDE DINITRATE 10 MG PO TABS
10.0000 mg | ORAL_TABLET | Freq: Two times a day (BID) | ORAL | Status: DC
  Administered 2013-11-01 – 2013-11-02 (×3): 10 mg via ORAL
  Filled 2013-11-01 (×5): qty 1

## 2013-11-01 MED ORDER — DONEPEZIL HCL 5 MG PO TABS
5.0000 mg | ORAL_TABLET | Freq: Every day | ORAL | Status: DC
  Administered 2013-11-01 – 2013-11-03 (×3): 5 mg via ORAL
  Filled 2013-11-01 (×4): qty 1

## 2013-11-01 MED ORDER — POLYETHYLENE GLYCOL 3350 17 G PO PACK
17.0000 g | PACK | Freq: Every day | ORAL | Status: DC
  Administered 2013-11-01 – 2013-11-04 (×3): 17 g via ORAL
  Filled 2013-11-01 (×4): qty 1

## 2013-11-01 MED ORDER — SODIUM CHLORIDE 0.9 % IV BOLUS (SEPSIS)
250.0000 mL | Freq: Once | INTRAVENOUS | Status: AC
  Administered 2013-11-01: 250 mL via INTRAVENOUS

## 2013-11-01 MED ORDER — HYDRALAZINE HCL 25 MG PO TABS
25.0000 mg | ORAL_TABLET | Freq: Three times a day (TID) | ORAL | Status: DC
  Administered 2013-11-01 – 2013-11-02 (×4): 25 mg via ORAL
  Filled 2013-11-01 (×7): qty 1

## 2013-11-01 MED ORDER — FENTANYL CITRATE 0.05 MG/ML IJ SOLN: 50.0000 ug | Freq: Once | INTRAMUSCULAR | Status: DC

## 2013-11-01 MED ORDER — CARVEDILOL 3.125 MG PO TABS
3.1250 mg | ORAL_TABLET | Freq: Two times a day (BID) | ORAL | Status: DC
  Administered 2013-11-01 – 2013-11-04 (×5): 3.125 mg via ORAL
  Filled 2013-11-01 (×8): qty 1

## 2013-11-01 MED ORDER — ALBUTEROL SULFATE (2.5 MG/3ML) 0.083% IN NEBU: 2.5000 mg | INHALATION_SOLUTION | RESPIRATORY_TRACT | Status: DC | PRN

## 2013-11-01 MED ORDER — IOHEXOL 300 MG/ML  SOLN
50.0000 mL | Freq: Once | INTRAMUSCULAR | Status: AC | PRN
  Administered 2013-11-01: 50 mL via ORAL

## 2013-11-01 MED ORDER — SODIUM CHLORIDE 0.9 % IV SOLN
INTRAVENOUS | Status: DC
  Administered 2013-11-01: 20:00:00 via INTRAVENOUS
  Administered 2013-11-02: 500 mL via INTRAVENOUS

## 2013-11-01 MED ORDER — PANTOPRAZOLE SODIUM 40 MG PO TBEC
40.0000 mg | DELAYED_RELEASE_TABLET | Freq: Every day | ORAL | Status: DC
  Administered 2013-11-01 – 2013-11-04 (×3): 40 mg via ORAL
  Filled 2013-11-01 (×5): qty 1

## 2013-11-01 MED ORDER — PROMETHAZINE HCL 25 MG PO TABS: 12.5000 mg | ORAL_TABLET | Freq: Four times a day (QID) | ORAL | Status: DC | PRN

## 2013-11-01 MED ORDER — GUAIFENESIN-DM 100-10 MG/5ML PO SYRP
5.0000 mL | ORAL_SOLUTION | ORAL | Status: DC | PRN
  Filled 2013-11-01: qty 5

## 2013-11-01 MED ORDER — ACETAMINOPHEN 325 MG PO TABS: 650.0000 mg | ORAL_TABLET | Freq: Four times a day (QID) | ORAL | Status: DC | PRN

## 2013-11-01 MED ORDER — ONDANSETRON HCL 4 MG/2ML IJ SOLN
4.0000 mg | Freq: Four times a day (QID) | INTRAMUSCULAR | Status: DC
  Administered 2013-11-01 – 2013-11-02 (×3): 4 mg via INTRAVENOUS
  Filled 2013-11-01 (×3): qty 2

## 2013-11-01 MED ORDER — ZOLPIDEM TARTRATE 5 MG PO TABS: 5.0000 mg | ORAL_TABLET | Freq: Every evening | ORAL | Status: DC | PRN

## 2013-11-01 MED ORDER — ACETAMINOPHEN 650 MG RE SUPP: 650.0000 mg | Freq: Four times a day (QID) | RECTAL | Status: DC | PRN

## 2013-11-01 MED ORDER — LORAZEPAM 2 MG/ML IJ SOLN
0.5000 mg | Freq: Two times a day (BID) | INTRAMUSCULAR | Status: DC
  Administered 2013-11-01 – 2013-11-02 (×3): 0.5 mg via INTRAVENOUS
  Filled 2013-11-01 (×4): qty 1

## 2013-11-01 MED ORDER — HEPARIN SODIUM (PORCINE) 5000 UNIT/ML IJ SOLN
5000.0000 [IU] | Freq: Three times a day (TID) | INTRAMUSCULAR | Status: DC
  Administered 2013-11-01 – 2013-11-03 (×7): 5000 [IU] via SUBCUTANEOUS
  Filled 2013-11-01 (×11): qty 1

## 2013-11-01 MED ORDER — ONDANSETRON HCL 4 MG/2ML IJ SOLN
4.0000 mg | Freq: Once | INTRAMUSCULAR | Status: AC
  Administered 2013-11-01: 4 mg via INTRAVENOUS
  Filled 2013-11-01: qty 2

## 2013-11-01 MED ORDER — MORPHINE SULFATE 2 MG/ML IJ SOLN: 1.0000 mg | INTRAMUSCULAR | Status: DC | PRN

## 2013-11-01 NOTE — ED Notes (Signed)
Pt wanting to dangle on the side of the bed.  This RN notified pt that it was not safe for her to be dangling on the side of the bed.  This RN helped pt readjust in bed to a a more comfortable position.

## 2013-11-01 NOTE — H&P (Signed)
PATIENT DETAILS Name: [name] Age: 80 y.o. Sex: female Date of Birth: 1917-08-09 Admit Date: 11/01/2013 PCP:No PCP Per Patient   CHIEF COMPLAINT:  Nausea and vomiting for the past 2 days Worsening abdominal distention for the past 2 days  HPI: [name] is a 80 y.o. female with a Past Medical History of chronic diastolic heart failure, history of coronary artery disease status post CABG in the 1980s, permanent atrial fibrillation, severe aortic stenosis, Stacie chronic kidney disease, status post permanent pacemaker in place who presents today with the above noted complaint. Patient was discharged from this facility on 09/30/13 after she was admitted with acute on chronic diastolic heart failure, during that admission she had a palliative care consult, as a result she was discharged home with hospice. She returns to the emergency room today accompanied by her healthcare power of attorney-her niece-Angie, because of the above-noted complaints. Apparently she was at her usual state of health, slowly declining but able to function. Was being followed by hospice at home. For the past 2-3 days, she has had intermittent nausea, vomiting and diarrhea. She's not able to tolerate clear liquids. Her niece noted that her abdomen had become significantly distended over the past 2 days as well. She was not able to swallow her oral medications. As is her she was brought to the emergency room, she was found to have significant worsening of her renal function, a CT of the abdomen showed significant ascites. I was asked to admit this patient for further evaluation and treatment.  ALLERGIES:   Allergies  Allergen Reactions  . Penicillins Swelling  . Simvastatin Other (See Comments)    Myalgia   . Ace Inhibitors Cough  . Amlodipine Besylate Rash  . Aspirin Nausea Only, Rash and Other (See Comments)    GI distress  . Food Other (See Comments)    Berries,tomato  . Nutritional  Supplements Other (See Comments)    Unknown.    PAST MEDICAL HISTORY: Past Medical History  Diagnosis Date  . CHF (congestive heart failure)     EF 30-35% on 2D Echo in July 2012  . High cholesterol   . Atrial fibrillation     rate controlled, on chronic coumadin therapy  . Sick sinus syndrome     s/p pacemaker  . Chronic kidney disease (CKD), stage III (moderate)   . Anemia     baseline 7.8-9.0  . Coronary artery disease     stent in 2008 shows CABG to LAD, RCA and circumflex with 100% occlusion  . Heart murmur   . Mitral regurgitation     severe MR,EF 30-35%  . Ischemic cardiomyopathy   . Aortic stenosis     severe  . Renal insufficiency, mild   . Family history of anesthesia complication   . Shortness of breath     PAST SURGICAL HISTORY: Past Surgical History  Procedure Laterality Date  . Exploratory laparotomy w/ bowel resection  2/12    in setting of SBO  . Pacemaker insertion  03/30/2009    [employer]  Zephyr  . R breast lumpectomy  05/2004  . Ureterolithotomy  01/2002  . Insert / replace / remove pacemaker    . Coronary artery bypass graft  1980's  . Flexible sigmoidoscopy N/A 05/15/2013    Procedure: FLEXIBLE SIGMOIDOSCOPY;  Surgeon: Vertell Novak., MD;  Location: [facility] ENDOSCOPY;  Service: Endoscopy;  Laterality: N/A;    MEDICATIONS AT HOME: Prior to Admission  medications   Medication Sig Start Date End Date Taking? Authorizing Provider  carvedilol (COREG) 3.125 MG tablet Take 3.125 mg by mouth 2 (two) times daily. 07/27/13  Yes Historical Provider, MD  docusate sodium 100 MG CAPS Take 200 mg by mouth daily. 09/30/13  Yes Brittainy Simmons, PA-C  donepezil (ARICEPT) 5 MG tablet Take 5 mg by mouth at bedtime.   Yes Historical Provider, MD  furosemide (LASIX) 80 MG tablet Take 1 tablet (80 mg total) by mouth 2 (two) times daily. 09/30/13  Yes Brittainy Simmons, PA-C  hydrALAZINE (APRESOLINE) 25 MG tablet Take 1 tablet (25 mg total) by mouth 3 (three) times daily.  09/30/13  Yes Brittainy Sharol Harness, PA-C  hydrocortisone (PROCTOSOL HC) 2.5 % rectal cream Place rectally 2 (two) times daily. 05/15/13  Yes Marianne L York, PA-C  hydrocortisone-pramoxine (PROCTOFOAM-HC) rectal foam Place 1 applicator rectally 2 (two) times daily. 05/15/13  Yes Marianne L York, PA-C  isosorbide dinitrate (ISORDIL) 10 MG tablet Take 1 tablet (10 mg total) by mouth 2 (two) times daily. 04/16/13  Yes Wilburt Finlay, PA-C  LORazepam (ATIVAN) 0.5 MG tablet Take 0.5 mg by mouth every 8 (eight) hours as needed for anxiety.   Yes Historical Provider, MD  pantoprazole (PROTONIX) 40 MG tablet Take 40 mg by mouth daily.    Yes Historical Provider, MD  polyethylene glycol (MIRALAX / GLYCOLAX) packet Take 17 g by mouth 2 (two) times daily as needed. 05/15/13  Yes Marianne L York, PA-C  potassium chloride SA (KLOR-CON M10) 20 MEQ tablet Take 1 tablet (20 mEq total) by mouth 2 (two) times daily. 09/30/13  Yes Brittainy Sharol Harness, PA-C  prochlorperazine (COMPAZINE) 10 MG tablet Take 10 mg by mouth every 6 (six) hours as needed for nausea or vomiting.   Yes Historical Provider, MD  traMADol (ULTRAM) 50 MG tablet Take 50 mg by mouth every 6 (six) hours as needed.   Yes Historical Provider, MD  zolpidem (AMBIEN) 5 MG tablet Take 5 mg by mouth at bedtime as needed for sleep.   Yes Historical Provider, MD  nitroGLYCERIN (NITROSTAT) 0.4 MG SL tablet Place 1 tablet (0.4 mg total) under the tongue every 5 (five) minutes as needed for chest pain. 09/08/12   Richardean Canal, MD    FAMILY HISTORY: No family history of CAD  SOCIAL HISTORY:  reports that she has never smoked. She has never used smokeless tobacco. She reports that she does not drink alcohol or use illicit drugs.  REVIEW OF SYSTEMS:  Constitutional:   No  weight loss, night sweats,  Fevers, chills, fatigue.  HEENT:    No headaches, Difficulty swallowing,Tooth/dental problems,Sore throat,  No sneezing, itching, ear ache, nasal congestion, post nasal  drip,   Cardio-vascular: No chest pain,  Orthopnea, PND, swelling in lower extremities, anasarca, dizziness, palpitations  GI:  No heartburn, indigestion  Resp: No shortness of breath with exertion or at rest.  No excess mucus, no productive cough, No non-productive cough,  No coughing up of blood.No change in color of mucus.No wheezing.No chest wall deformity  Skin:  no rash or lesions.  GU:  no dysuria, change in color of urine, no urgency or frequency.  No flank pain.  Musculoskeletal: No joint pain or swelling.  No decreased range of motion.  No back pain.  Psych: No change in mood or affect. No depression or anxiety.  No memory loss.   PHYSICAL EXAM: Blood pressure 142/84, pulse 98, temperature 97.8 F (36.6 C), temperature source Oral, resp. rate  18, height 5\' 6"  (1.676 m), weight 59.693 kg (131 lb 9.6 oz), SpO2 92.00%.  General appearance :Awake, alert, In mild distress due to nausea. Speech Clear. Not toxic Looking. Her very hard of hearing HEENT: Atraumatic and Normocephalic, pupils equally reactive to light and accomodation Neck: supple, no JVD. No cervical lymphadenopathy.  Chest:Good air entry bilaterally, no added sounds  CVS: S1 S2 regular, systolic murmur all over the precordium Abdomen: Bowel sounds present, Non tender, significantly distended.  Extremities: B/L Lower Ext shows no edema, both legs are warm to touch Neurology:  Non focal Skin:No Rash Wounds:N/A  LABS ON ADMISSION:   Recent Labs  11/01/13 1415  NA 141  K 4.0  CL 101  CO2 20  GLUCOSE 118*  BUN 64*  CREATININE 3.20*  CALCIUM 8.8    Recent Labs  11/01/13 1415  AST 19  ALT 7  ALKPHOS 122*  BILITOT 1.2  PROT 6.7  ALBUMIN 3.6   No results found for this basename: LIPASE, AMYLASE,  in the last 72 hours  Recent Labs  11/01/13 1415  WBC 3.6*  NEUTROABS 2.3  HGB 8.7*  HCT 25.6*  MCV 90.1  PLT 269   No results found for this basename: CKTOTAL, CKMB, CKMBINDEX, TROPONINI,   in the last 72 hours No results found for this basename: DDIMER,  in the last 72 hours No components found with this basename: POCBNP,    RADIOLOGIC STUDIES ON ADMISSION: Ct Abdomen Pelvis Wo Contrast  11/01/2013   CLINICAL DATA:  Patient complaining of diarrhea, abdominal pain, vomiting, firm distended abdomen  EXAM: CT ABDOMEN AND PELVIS WITHOUT CONTRAST  TECHNIQUE: Multidetector CT imaging of the abdomen and pelvis was performed following the standard protocol without intravenous contrast.  COMPARISON:  CT ABD/PELV WO CM dated 05/08/2013; DG ABD ACUTE W/CHEST dated 05/08/2013  FINDINGS: Small right pleural effusion. Infiltrate right lower lobe posteriorly. Cardiac enlargement. Heavy coronary arterial calcification. Cardiac pacer leads noted.  Large volume of ascites in the abdomen and pelvis. Liver span not enlarged, at 15 cm. A few hepatic and splenic calcifications suggesting prior granulomatous exposure. Pancreas and adrenal glands negative. Heavy aortic calcification with mild dilatation mid abdominal aorta 2 3.1 cm. Bilateral renal atrophy. 1 cm high attenuation right renal lesion possibly a complicated cyst. Nonobstructing bilateral renal calculi. 1.5 cm low-attenuation left renal lesion. This is possibly a cyst.  Nonobstructive gas pattern. Ascites extends into small left inguinal and umbilical hernias. Bladder normal. Calcifications in the uterus suggests fibroids. No acute musculoskeletal findings.  IMPRESSION: Large volume of ascites increased from 2014. Small right pleural effusion. Possible pneumonitis right lung base.   Electronically Signed   By: Esperanza Heir M.D.   On: 11/01/2013 17:05     EKG: Independently reviewed. Atrial fibrillation  ASSESSMENT AND PLAN: Present on Admission:  . Ascites - Suspect cardiac ascites - worsening in spite of diuretic therapy. Unfortunately with nausea vomiting and worsening renal function, and clinically appears dehydrated. - Family agreeable for  therapeutic paracentesis for comfort purposes.  - Will not commence workup on asctic fluid.   . Vomiting and diarrhea - Supportive care - Dehydrated clinically-unfortunately complicated volume status-clinically dry but with worsening ascites - Gently hydrate, scheduled Zofran,prn Phenergan, attempt clear liquid diet. - After long discussion with family, apart from supportive care, no further escalation in treatment or investigation. If any further deterioration, family agreeable for transition to full comfort status.  . Acute on chronic renal failure - Likely prerenal, given the significant nausea,  vomiting and diarrhea.  - Hold diuretics overnight, start on gentle hydration   . Aortic stenosis- moderate to moderate/ severe 04/11/13 - Not a candidate for further intervention. On hospice care at home.   . Atrial fibrillation, permanent - Has a permanent pacemaker in place, not a candidate for anticoagulation. On hospice care, no need to monitor on telemetry.   . Coronary artery disease - S/P remote CABG x 4 (1980's) - Stable. No further workup  . Chronic diastolic heart failure  - Clinically dry, gentle hydration. Hold  Lasix for now  - Last EF in September 2014 around 45-50%.  . Pacemaker - St. Jude, dual ch. programmed VVIR  .Goals of care - DO NOT RESUSCITATE/DO NOT INTUBATE  - No heroic measures  - Symptom control for nausea vomiting and ascites - family agreeable with paracentesis  - Is any further deterioration, family agreeable to transition to comfort measures and residential hospice placement   Further plan will depend as patient's clinical course evolves and further radiologic and laboratory data become available. Patient will be monitored closely.  Above noted plan was discussed with patient/niece, they were in agreement.   DVT Prophylaxis: Prophylactic Heparin  Code Status: DNR  Total time spent for admission equals 45 minutes.  Maretta BeesShanker M Kienan Doublin [facility] Pager [phone]  If 7PM-7AM, please contact night-coverage [email or web address] Password [email or web address] 11/01/2013, 6:56 PM

## 2013-11-01 NOTE — ED Provider Notes (Signed)
Medical screening examination/treatment/procedure(s) were conducted as a shared visit with non-physician practitioner(s) and myself.  I personally evaluated the patient during the encounter. A 80-year-old female brought for evaluation of abdominal pain and distention. She has also had loose stool and vomiting for the past several days. She is on hospice do to multiple medical issues.  On exam, vitals are stable and the patient is afebrile. Head is atraumatic, normocephalic.  The abdomen is mildly distended with a fluid wave present. Bowel sounds are present. Extremities with minimal edema.  Workup reveals significant ascites on the CT scan. Due to her abdominal pain and inability to tolerate by mouth, medicine has been consulted the patient will be admitted to their service. She may need an ultrasound-guided paracentesis for both therapeutic and diagnostic purposes. This will be left to the discretion of the admitting physicians.   EKG Interpretation   Date/Time:  Sunday November 01 2013 13:13:10 EDT Ventricular Rate:  78 PR Interval:    QRS Duration: 127 QT Interval:  405 QTC Calculation: 461 R Axis:   122 Text Interpretation:  Atrial fibrillation Nonspecific intraventricular  conduction delay Anteroseptal infarct, old Borderline repolarization  abnormality No significant change since 05/08/13 Confirmed by Malva Cogan  MD,  Logen Fowle ([id number]) on 11/01/2013 2:11:43 PM       Geoffery Lyons, MD 11/01/13 1921

## 2013-11-01 NOTE — ED Notes (Signed)
Per admitting MD, pt no longer needs to be on monitor. Family at bedside in agreeance with this plan.

## 2013-11-01 NOTE — ED Provider Notes (Signed)
CSN: [id number]     Arrival date & time 11/01/13  1254 History   First MD Initiated Contact with Patient 11/01/13 1302     Chief Complaint  Patient presents with  . Abdominal Pain     (Consider location/radiation/quality/duration/timing/severity/associated sxs/prior Treatment) HPI  CODE STATUS: DNR (paperwork present) Hospice Patient  Patient here by EMS for abdominal pain, abdominal distention, loose stool.  She developed the loose stool x 5 incidents on Friday, one on Saturday. SHe had an episode of vomiting last night and then developed pain this morning.The patient is very hard of hearing and doesn't answer many questions. She tells me she feels like she is pregnant since her belly has grown so much. She has PMH of bowel ubstruction requiring bowel resection (2012), A. Fib, CHF, pace maker (sick sinus syndrome), SOB.   Past Medical History  Diagnosis Date  . CHF (congestive heart failure)     EF 30-35% on 2D Echo in July 2012  . High cholesterol   . Atrial fibrillation     rate controlled, on chronic coumadin therapy  . Sick sinus syndrome     s/p pacemaker  . Chronic kidney disease (CKD), stage III (moderate)   . Anemia     baseline 7.8-9.0  . Coronary artery disease     stent in 2008 shows CABG to LAD, RCA and circumflex with 100% occlusion  . Heart murmur   . Mitral regurgitation     severe MR,EF 30-35%  . Ischemic cardiomyopathy   . Aortic stenosis     severe  . Renal insufficiency, mild   . Family history of anesthesia complication   . Shortness of breath    Past Surgical History  Procedure Laterality Date  . Exploratory laparotomy w/ bowel resection  2/12    in setting of SBO  . Pacemaker insertion  03/30/2009    [employer]  Zephyr  . R breast lumpectomy  05/2004  . Ureterolithotomy  01/2002  . Insert / replace / remove pacemaker    . Coronary artery bypass graft  1980's  . Flexible sigmoidoscopy N/A 05/15/2013    Procedure: FLEXIBLE SIGMOIDOSCOPY;  Surgeon:  Vertell Novak., MD;  Location: [facility] ENDOSCOPY;  Service: Endoscopy;  Laterality: N/A;   No family history on file. History  Substance Use Topics  . Smoking status: Never Smoker   . Smokeless tobacco: Never Used  . Alcohol Use: No   OB History   Grav Para Term Preterm Abortions TAB SAB Ect Mult Living                 Review of Systems  Unable to perform ROS Gastrointestinal: Positive for nausea, vomiting, abdominal pain, diarrhea and abdominal distention. Negative for blood in stool and anal bleeding.      Allergies  Penicillins; Simvastatin; Ace inhibitors; Amlodipine besylate; Aspirin; Food; and Nutritional supplements  Home Medications   Current Outpatient Rx  Name  Route  Sig  Dispense  Refill  . carvedilol (COREG) 3.125 MG tablet   Oral   Take 3.125 mg by mouth 2 (two) times daily.         Marland Kitchen docusate sodium 100 MG CAPS   Oral   Take 200 mg by mouth daily.   10 capsule   0   . donepezil (ARICEPT) 5 MG tablet   Oral   Take 5 mg by mouth at bedtime.         . furosemide (LASIX) 80 MG tablet   Oral  Take 1 tablet (80 mg total) by mouth 2 (two) times daily.   60 tablet   5   . hydrALAZINE (APRESOLINE) 25 MG tablet   Oral   Take 1 tablet (25 mg total) by mouth 3 (three) times daily.   60 tablet   5   . hydrocortisone (PROCTOSOL HC) 2.5 % rectal cream   Rectal   Place rectally 2 (two) times daily.   30 g   0   . hydrocortisone-pramoxine (PROCTOFOAM-HC) rectal foam   Rectal   Place 1 applicator rectally 2 (two) times daily.   10 g   0   . isosorbide dinitrate (ISORDIL) 10 MG tablet   Oral   Take 1 tablet (10 mg total) by mouth 2 (two) times daily.   60 tablet   5   . LORazepam (ATIVAN) 0.5 MG tablet   Oral   Take 0.5 mg by mouth every 8 (eight) hours as needed for anxiety.         . pantoprazole (PROTONIX) 40 MG tablet   Oral   Take 40 mg by mouth daily.          . polyethylene glycol (MIRALAX / GLYCOLAX) packet   Oral   Take  17 g by mouth 2 (two) times daily as needed.         . potassium chloride SA (KLOR-CON M10) 20 MEQ tablet   Oral   Take 1 tablet (20 mEq total) by mouth 2 (two) times daily.   60 tablet   5   . prochlorperazine (COMPAZINE) 10 MG tablet   Oral   Take 10 mg by mouth every 6 (six) hours as needed for nausea or vomiting.         . traMADol (ULTRAM) 50 MG tablet   Oral   Take 50 mg by mouth every 6 (six) hours as needed.         . zolpidem (AMBIEN) 5 MG tablet   Oral   Take 5 mg by mouth at bedtime as needed for sleep.         . nitroGLYCERIN (NITROSTAT) 0.4 MG SL tablet   Sublingual   Place 1 tablet (0.4 mg total) under the tongue every 5 (five) minutes as needed for chest pain.   20 tablet   0    BP 116/53  Pulse 60  Temp(Src) 97.7 F (36.5 C) (Oral)  Resp 16  SpO2 100% Physical Exam  Nursing note and vitals reviewed. Constitutional: She appears well-developed and well-nourished. She appears cachectic. No distress.  HENT:  Head: Normocephalic and atraumatic.  Eyes: Pupils are equal, round, and reactive to light.  Neck: Normal range of motion. Neck supple.  Cardiovascular: Normal rate and regular rhythm.   Pulmonary/Chest: Effort normal.  Abdominal: Soft. She exhibits distension. She exhibits no shifting dullness and no fluid wave. There is tenderness (diffuse and mild). There is no guarding.  Neurological: She is alert.  Skin: Skin is warm and dry.    ED Course  Procedures (including critical care time) Labs Review Labs Reviewed  COMPREHENSIVE METABOLIC PANEL - Abnormal; Notable for the following:    Glucose, Bld 118 (*)    BUN 64 (*)    Creatinine, Ser 3.20 (*)    Alkaline Phosphatase 122 (*)    GFR calc non Af Amer 11 (*)    GFR calc Af Amer 13 (*)    All other components within normal limits  CBC WITH DIFFERENTIAL - Abnormal; Notable for the  following:    WBC 3.6 (*)    RBC 2.84 (*)    Hemoglobin 8.7 (*)    HCT 25.6 (*)    RDW 16.5 (*)    All  other components within normal limits  PROTIME-INR  URINALYSIS, ROUTINE W REFLEX MICROSCOPIC  OCCULT BLOOD X 1 CARD TO LAB, STOOL  POC OCCULT BLOOD, ED   Imaging Review Ct Abdomen Pelvis Wo Contrast  11/01/2013   CLINICAL DATA:  Patient complaining of diarrhea, abdominal pain, vomiting, firm distended abdomen  EXAM: CT ABDOMEN AND PELVIS WITHOUT CONTRAST  TECHNIQUE: Multidetector CT imaging of the abdomen and pelvis was performed following the standard protocol without intravenous contrast.  COMPARISON:  CT ABD/PELV WO CM dated 05/08/2013; DG ABD ACUTE W/CHEST dated 05/08/2013  FINDINGS: Small right pleural effusion. Infiltrate right lower lobe posteriorly. Cardiac enlargement. Heavy coronary arterial calcification. Cardiac pacer leads noted.  Large volume of ascites in the abdomen and pelvis. Liver span not enlarged, at 15 cm. A few hepatic and splenic calcifications suggesting prior granulomatous exposure. Pancreas and adrenal glands negative. Heavy aortic calcification with mild dilatation mid abdominal aorta 2 3.1 cm. Bilateral renal atrophy. 1 cm high attenuation right renal lesion possibly a complicated cyst. Nonobstructing bilateral renal calculi. 1.5 cm low-attenuation left renal lesion. This is possibly a cyst.  Nonobstructive gas pattern. Ascites extends into small left inguinal and umbilical hernias. Bladder normal. Calcifications in the uterus suggests fibroids. No acute musculoskeletal findings.  IMPRESSION: Large volume of ascites increased from 2014. Small right pleural effusion. Possible pneumonitis right lung base.   Electronically Signed   By: Esperanza Heir M.D.   On: 11/01/2013 17:05     EKG Interpretation   Date/Time:  Sunday November 01 2013 13:13:10 EDT Ventricular Rate:  78 PR Interval:    QRS Duration: 127 QT Interval:  405 QTC Calculation: 461 R Axis:   122 Text Interpretation:  Atrial fibrillation Nonspecific intraventricular  conduction delay Anteroseptal infarct, old  Borderline repolarization  abnormality No significant change since 05/08/13 Confirmed by DELOS  MD,  DOUGLAS ([id number]) on 11/01/2013 2:11:43 PM      MDM   Final diagnoses:  Ascites  Vomiting and diarrhea  Renal insufficiency    Dr. Judd Lien has seen this patient as well. Her lab results showed mainly to be at baseline however creatinine is elevated. This is probably prerenal due to decreased fluid and oral intake. She has increased ascites in her abdomen for unknown cause. Patient has not been in to tolerate by mouth at home last night or today. Has not taken any of her medications. This time I will admit to medicine for possible therapeutic diagnostic tap abdomen. Also for nutrition until she can tolerate oral.   Inpatient, [facility], team 10, [facility]  Dorthula Matas, PA-C 11/01/13 1731

## 2013-11-01 NOTE — Progress Notes (Signed)
NURSING PROGRESS NOTE  [name] [id number] Admission Data: 11/01/2013 7:13 PM Attending Provider: Maretta Bees, MD PCP:No PCP Per Patient Code Status: DNR   [name] is a 80 y.o. female patient admitted from ED:  -No acute distress noted.  -No complaints of shortness of breath.  -No complaints of chest pain.   Blood pressure 142/84, pulse 98, temperature 97.8 F (36.6 C), temperature source Oral, resp. rate 18, height 5\' 6"  (1.676 m), weight 59.693 kg (131 lb 9.6 oz), SpO2 92.00%.   IV Fluids: IV to left arm intact with no phlebitis or infiltration noted. Patient denies pain or discomfort to site at this time.  Allergies:  Penicillins; Simvastatin; Ace inhibitors; Amlodipine besylate; Aspirin; Food; and Nutritional supplements  Past Medical History:   has a past medical history of CHF (congestive heart failure); High cholesterol; Atrial fibrillation; Sick sinus syndrome; Chronic kidney disease (CKD), stage III (moderate); Anemia; Coronary artery disease; Heart murmur; Mitral regurgitation; Ischemic cardiomyopathy; Aortic stenosis; Renal insufficiency, mild; Family history of anesthesia complication; and Shortness of breath.  Past Surgical History:   has past surgical history that includes Exploratory laparotomy w/ bowel resection (2/12); Pacemaker insertion (03/30/2009); R breast lumpectomy (05/2004); Ureterolithotomy (01/2002); Insert / replace / remove pacemaker; Coronary artery bypass graft (1980's); and Flexible sigmoidoscopy (N/A, 05/15/2013).  Skin: Pt has old incision scars to mid abdomen and mid chest. Pt skin is dry with small skin tear noted to right buttocks. Pt niece stated that pt "got itchy and scratched it" area is healed with no drainage noted.   Patient/Family orientated to room. Information packet given to patient/family. Admission inpatient armband information verified with patient/family to include name and date of birth and placed on patient arm. Side  rails up x 2, fall assessment and education completed with patient/family. Patient/family able to verbalize understanding of risk associated with falls and verbalized understanding to call for assistance before getting out of bed. Call light within reach. Patient/family able to voice and demonstrate understanding of unit orientation instructions.    Will continue to evaluate and treat per MD orders.  Cathlyn Parsons, RN

## 2013-11-01 NOTE — ED Notes (Signed)
Applied bair hugger ?

## 2013-11-01 NOTE — ED Notes (Signed)
Received pt from home with c/o loose stool x 4-5 on Friday, x 1 Saturday. Pt c/o abdominal pain last night and vomited x 1 today. Pt presents with abdomen hard to touch. Pt given 4 mg of Zofran by EMS.

## 2013-11-01 NOTE — ED Notes (Addendum)
Pt transported to Ct.

## 2013-11-01 NOTE — Progress Notes (Signed)
Report received from ED nurse for pt to be admitted into 5W35.

## 2013-11-02 ENCOUNTER — Inpatient Hospital Stay (HOSPITAL_COMMUNITY)

## 2013-11-02 DIAGNOSIS — Z515 Encounter for palliative care: Secondary | ICD-10-CM

## 2013-11-02 DIAGNOSIS — I4891 Unspecified atrial fibrillation: Secondary | ICD-10-CM

## 2013-11-02 LAB — CBC
HCT: 24.3 % — ABNORMAL LOW (ref 36.0–46.0)
HEMOGLOBIN: 8.4 g/dL — AB (ref 12.0–15.0)
MCH: 30.9 pg (ref 26.0–34.0)
MCHC: 34.6 g/dL (ref 30.0–36.0)
MCV: 89.3 fL (ref 78.0–100.0)
Platelets: 278 10*3/uL (ref 150–400)
RBC: 2.72 MIL/uL — ABNORMAL LOW (ref 3.87–5.11)
RDW: 16.5 % — AB (ref 11.5–15.5)
WBC: 3.7 10*3/uL — ABNORMAL LOW (ref 4.0–10.5)

## 2013-11-02 LAB — BASIC METABOLIC PANEL
BUN: 64 mg/dL — AB (ref 6–23)
CO2: 19 mEq/L (ref 19–32)
Calcium: 8.7 mg/dL (ref 8.4–10.5)
Chloride: 102 mEq/L (ref 96–112)
Creatinine, Ser: 3.07 mg/dL — ABNORMAL HIGH (ref 0.50–1.10)
GFR, EST AFRICAN AMERICAN: 14 mL/min — AB (ref >90)
GFR, EST NON AFRICAN AMERICAN: 12 mL/min — AB (ref >90)
Glucose, Bld: 94 mg/dL (ref 70–99)
Potassium: 4.2 mEq/L (ref 3.7–5.3)
SODIUM: 141 meq/L (ref 137–147)

## 2013-11-02 LAB — CLOSTRIDIUM DIFFICILE BY PCR: CDIFFPCR: NEGATIVE

## 2013-11-02 MED ORDER — ONDANSETRON HCL 4 MG/2ML IJ SOLN: 4.0000 mg | Freq: Four times a day (QID) | INTRAMUSCULAR | Status: DC | PRN

## 2013-11-02 NOTE — Procedures (Signed)
US guided therapeutic paracentesis performed yielding 2 liters (maximum ordered) blood-tinged fluid. No immediate complications.

## 2013-11-02 NOTE — Progress Notes (Addendum)
Inpatient RN visit- [facility] 5W Room 35  -[facility] RN Visit-Karen Merilynn Finland RN  Related admission to [facility] diagnosis of CHF  Pt is DNR CODE code.   Patient not in her room, spoke with Staff RN Edgardo Roys, patient just taken down for paracentisis, shadow chart not available at this time. Spoke with [facility] Gavin Pound, patient is planned for possible discharge today via non emergent transport. Granddaughter Olegario Messier in patient's room, and aware of possible discharge,  she verified that no new equipment is needed in the home at this time. She is agreeable to patient returning home with hospice services continuing to follow. Patient seen upon return from procedure, resting quietly in bed, eyes closed. Current plan is for discharge tomorrow per [facility].  Patient home medication list,  transfer summary, and OOF DNR in place on chart Please call [facility] @ [phone]-  with any hospice needs.   Thank you. Hansel Starling, RN  [facility]  Hospice Liaison  [phone])

## 2013-11-02 NOTE — Care Management Note (Signed)
    Page 1 of 2   11/05/2013     9:01:08 AM   CARE MANAGEMENT NOTE 11/05/2013  Patient:  [name], [name]   Account Number:  [id number]  Date Initiated:  11/02/2013  Documentation initiated by:  Letha Cape  Subjective/Objective Assessment:   dx ascites  admit- from home with hospice. [facility]  Daughter Clydie Braun is poa [phone].     Action/Plan:   Anticipated DC Date:  11/04/2013   Anticipated DC Plan:  HOSPICE MEDICAL FACILITY  In-house referral  Clinical Social Worker      DC Planning Services  CM consult      PAC Choice  HOSPICE  Resumption Of Svcs/PTA Provider   Choice offered to / List presented to:          [facility] arranged  HH-1 RN      [facility] agency  [employer] [facility] [place]   Status of service:  Completed, signed off Medicare Important Message given?   (If response is "NO", the following Medicare IM given date fields will be blank) Date Medicare IM given:   Date Additional Medicare IM given:    Discharge Disposition:  HOME W HOSPICE CARE  Per UR Regulation:  Reviewed for med. necessity/level of care/duration of stay  If discussed at Long Length of Stay Meetings, dates discussed:    Comments:  11/04/13 2041 Letha Cape RN,BSN [phone] patient was determined not to be [facility] eligible, patient dc home with Hospice with [facility].   11/03/13 1148 Letha Cape RN, BSN [phone] PA has spoken with Cain Sieve, who  wants patient to transition to BP, CSW aware and the CSW for [facility] has been contacted.  11/02/13 1515 Letha Cape RN, BSN [phone] patient is from home with hosice with [facility], patient is s/p paracentesis today and will need to eat before dc, plan for dc on 4/14, will need ambulance transport, CSW referral.

## 2013-11-02 NOTE — Progress Notes (Addendum)
[facility] PROGRESS NOTE  Katherine [name] [name] ZOX:[id number] DOB: 21-Jul-1918 DOA: 11/01/2013 PCP: No PCP Per Patient  Assessment/Plan: *Present on Admission:  . Ascites  - Suspect cardiac ascites - worsening in spite of diuretic therapy.  - Unfortunately with nausea vomiting and worsening renal function, and clinically appears dehydrated.  - palliative paracentesis removed 2L of blood tinged fluid. - She was intravascularly depleted on admission and has been receiving gentle IVF.  She will resume diuretics 4/14. - Will not commence workup on asctic fluid.   . Vomiting and diarrhea  - Supportive care  - Gently hydrate, scheduled Zofran,prn Phenergan, will attempt clear diet and progress ad lib - After long discussion with family, apart from supportive care, no further escalation in treatment or investigation. If any further deterioration, family agreeable for transition to full comfort status.   . Acute on chronic renal failure  - Likely prerenal, given the significant nausea, vomiting and diarrhea.  - started on gentle hydration -Recheck renal function in AM  . Aortic stenosis- moderate to moderate/ severe 04/11/13  - Not a candidate for further intervention. On hospice care at home.   . Atrial fibrillation, permanent  - Has a permanent pacemaker in place, not a candidate for anticoagulation. On hospice care, no need to monitor on telemetry.   . Coronary artery disease - S/P remote CABG x 4 (1980's)  - Stable. No further workup   . Chronic diastolic heart failure  - Clinically dry, gentle hydration. Hold Lasix for now  - Last EF in September 2014 around 45-50%.   . Pacemaker - St. Jude, dual ch. programmed VVIR   .Goals of care  - DO NOT RESUSCITATE/DO NOT INTUBATE  - No heroic measures  - Symptom control for nausea vomiting and ascites - family agreeable with paracentesis  - Is any further deterioration, family agreeable to transition to comfort measures and residential  hospice placement   Further plan will depend as patient's clinical course evolves and further radiologic and laboratory data become available. Patient will be monitored closely.  Above noted plan was discussed with patient/niece, they were in agreement.   DVT Prophylaxis: Prophylactic Heparin  Code Status:  DNR Family Communication: Patient and patient's niece, they were in agreement Disposition Plan: plan for d/c on 4/14   Consultants:  none  Procedures:  paracentesis  Antibiotics:  none  HPI/Subjective: Katherine [name], [name] is a 80 y.o female who presented to the ED with abdominal pain, abdominal distention, and loose stool.  Katherine [name] is very hard of hearing.  This morning she was very hard to wake up.  She had no new complaints and said she slept well.  She states no pain in her abdomin.   Objective: Filed Vitals:   11/02/13 1214  BP: 107/55  Pulse:   Temp:   Resp:     Intake/Output Summary (Last 24 hours) at 11/02/13 1314 Last data filed at 11/02/13 0600  Gross per 24 hour  Intake      0 ml  Output      2 ml  Net     -2 ml   Filed Weights   11/01/13 1831 11/02/13 0500  Weight: 59.693 kg (131 lb 9.6 oz) 56.427 kg (124 lb 6.4 oz)    Exam: Physical Exam Nursing note and vitals reviewed.  Head: Normocephalic and atraumatic.  Eyes: Pupils are equal, round, and reactive to light.  Neck: No JVD present.  Cardiovascular: Normal rate and regular rhythm.  Murmur heard.  Respiratory:  Effort normal and breath sounds normal. No stridor. No respiratory distress. She has no wheezes. She exhibits no tenderness.  GI: Bowel sounds are normal. She exhibits no distension. There is no tenderness. There is no rebound and no guarding.  Musculoskeletal: Normal range of motion. She exhibits no tenderness.  Neurological: She is alert and oriented to person, place, and time.  Skin: Skin is warm and dry. She is not diaphoretic. No pallor.   Data Reviewed: Basic Metabolic  Panel:  Recent Labs Lab 11/01/13 1415 11/01/13 2209 11/02/13 0722  NA 141  --  141  K 4.0  --  4.2  CL 101  --  102  CO2 20  --  19  GLUCOSE 118*  --  94  BUN 64*  --  64*  CREATININE 3.20* 3.02* 3.07*  CALCIUM 8.8  --  8.7   Liver Function Tests:  Recent Labs Lab 11/01/13 1415  AST 19  ALT 7  ALKPHOS 122*  BILITOT 1.2  PROT 6.7  ALBUMIN 3.6   Recent Labs Lab 11/01/13 1415 11/01/13 2209 11/02/13 0722  WBC 3.6* 3.9* 3.7*  NEUTROABS 2.3  --   --   HGB 8.7* 8.6* 8.4*  HCT 25.6* 25.8* 24.3*  MCV 90.1 89.6 89.3  PLT 269 295 278   BNP (last 3 results)  Recent Labs  05/14/13 0545 09/22/13 1450 09/25/13 1200  PROBNP 14503.0* 27527.0* 28775.0*   Recent Results (from the past 240 hour(s))  CLOSTRIDIUM DIFFICILE BY PCR     Status: None   Collection Time    11/02/13  1:50 AM      Result Value Ref Range Status   C difficile by pcr NEGATIVE  NEGATIVE Final     Studies: Ct Abdomen Pelvis Wo Contrast  11/01/2013   CLINICAL DATA:  Patient complaining of diarrhea, abdominal pain, vomiting, firm distended abdomen  EXAM: CT ABDOMEN AND PELVIS WITHOUT CONTRAST  TECHNIQUE: Multidetector CT imaging of the abdomen and pelvis was performed following the standard protocol without intravenous contrast.  COMPARISON:  CT ABD/PELV WO CM dated 05/08/2013; DG ABD ACUTE W/CHEST dated 05/08/2013  FINDINGS: Small right pleural effusion. Infiltrate right lower lobe posteriorly. Cardiac enlargement. Heavy coronary arterial calcification. Cardiac pacer leads noted.  Large volume of ascites in the abdomen and pelvis. Liver span not enlarged, at 15 cm. A few hepatic and splenic calcifications suggesting prior granulomatous exposure. Pancreas and adrenal glands negative. Heavy aortic calcification with mild dilatation mid abdominal aorta 2 3.1 cm. Bilateral renal atrophy. 1 cm high attenuation right renal lesion possibly a complicated cyst. Nonobstructing bilateral renal calculi. 1.5 cm  low-attenuation left renal lesion. This is possibly a cyst.  Nonobstructive gas pattern. Ascites extends into small left inguinal and umbilical hernias. Bladder normal. Calcifications in the uterus suggests fibroids. No acute musculoskeletal findings.  IMPRESSION: Large volume of ascites increased from 2014. Small right pleural effusion. Possible pneumonitis right lung base.   Electronically Signed   By: Esperanza Heiraymond  Rubner M.D.   On: 11/01/2013 17:05    Scheduled Meds: . carvedilol  3.125 mg Oral BID WC  . donepezil  5 mg Oral QHS  . heparin  5,000 Units Subcutaneous 3 times per day  . hydrALAZINE  25 mg Oral TID  . isosorbide dinitrate  10 mg Oral BID  . LORazepam  0.5 mg Intravenous Q12H  . pantoprazole  40 mg Oral Daily  . polyethylene glycol  17 g Oral Daily   Continuous Infusions: . sodium chloride 50 mL/hr at  11/01/13 1945    Principal Problem:   Acute on chronic renal failure Active Problems:   Atrial fibrillation, permanent   Coronary artery disease - S/P remote CABG x 4 (1980's)   Aortic stenosis- moderate to moderate/ severe 04/11/13   Pacemaker - St. Jude, dual ch. programmed VVIR   Ascites   Hospice care patient    Time spent: [address] Driscilla Grammes [place], [place] [phone]  [facility]  If 7PM-7AM, please contact night-coverage at [email or web address], password [email or web address] 11/02/2013, 1:14 PM  LOS: 1 day   Attending Agree with the assessment and plan as outlined above. Home 4/14. Repeat BMET in am.  Windell Norfolk MD

## 2013-11-02 NOTE — Discharge Instructions (Signed)
Ascites °Ascites is a gathering of fluid in the belly (abdomen). This is most often caused by liver disease. It may also be caused by a number of other less common problems. It causes a ballooning out (distension) of the abdomen. °CAUSES  °Scarring of the liver (cirrhosis) is the most common cause of ascites. Other causes include: °· Infection or inflammation in the abdomen. °· Cancer in the abdomen. °· Heart failure. °· Certain forms of kidney failure (nephritic syndrome). °· Inflammation of the pancreas. °· Clots in the veins of the liver. °SYMPTOMS  °In the early stages of ascites, you may not have any symptoms. The main symptom of ascites is a sense of abdominal bloating. This is due to the presence of fluid. This may also cause an increase in abdominal or waist size. People with this condition can develop swelling in the legs, and men can develop a swollen scrotum. When there is a lot of fluid, it may be hard to breath. Stretching of the abdomen by fluid can be painful. °DIAGNOSIS  °Certain features of your medical history, such as a history of liver disease and of an enlarging abdomen, can suggest the presence of ascites. The diagnosis of ascites can be made on physical exam by your caregiver. An abdominal ultrasound examination can confirm that ascites is present, and estimate the amount of fluid. °Once ascites is confirmed, it is important to determine its cause. Again, a history of one of the conditions listed in "CAUSES" provides a strong clue. A physical exam is important, and blood and X-ray tests may be needed. During a procedure called paracentesis, a sample of fluid is removed from the abdomen. This can determine certain key features about the fluid, such as whether or not infection or cancer is present. Your caregiver will determine if a paracentesis is necessary. They will describe the procedure to you. °PREVENTION  °Ascites is a complication of other conditions. Therefore to prevent ascites, you  must seek treatment for any significant health conditions you have. Once ascites is present, careful attention to fluid and salt intake may help prevent it from getting worse. If you have ascites, you should not drink alcohol. °PROGNOSIS  °The prognosis of ascites depends on the underlying disease. If the disease is reversible, such as with certain infections or with heart failure, then ascites may improve or disappear. When ascites is caused by cirrhosis, then it indicates that the liver disease has worsened, and further evaluation and treatment of the liver disease is needed. If your ascites is caused by cancer, then the success or failure of the cancer treatment will determine whether your ascites will improve or worsen. °RISKS AND COMPLICATIONS  °Ascites is likely to worsen if it is not properly diagnosed and treated. A large amount of ascites can cause pain and difficulty breathing. The main complication, besides worsening, is infection (called spontaneous bacterial peritonitis). This requires prompt treatment. °TREATMENT  °The treatment of ascites depends on its cause. When liver disease is your cause, medical management using water pills (diuretics) and decreasing salt intake is often effective. Ascites due to peritoneal inflammation or malignancy (cancer) alone does not respond to salt restriction and diuretics. Hospitalization is sometimes required. °If the treatment of ascites cannot be managed with medications, a number of other treatments are available. Your caregivers will help you decide which will work best for you. Some of these are: °· Removal of fluid from the abdomen (paracentesis). °· Fluid from the abdomen is passed into a vein (peritoneovenous shunting). °·   Liver transplantation. °· Transjugular intrahepatic portosystemic stent shunt. °HOME CARE INSTRUCTIONS  °It is important to monitor body weight and the intake and output of fluids. Weigh yourself at the same time every day. Record your  weights. Fluid restriction may be necessary. It is also important to know your salt intake. The more salt you take in, the more fluid you will retain. Ninety percent of people with ascites respond to this approach. °· Follow any directions for medicines carefully. °· Follow up with your caregiver, as directed. °· Report any changes in your health, especially any new or worsening symptoms. °· If your ascites is from liver disease, avoid alcohol and other substances toxic to the liver. °SEEK MEDICAL CARE IF:  °· Your weight increases more than a few pounds in a few days. °· Your abdominal or waist size increases. °· You develop swelling in your legs. °· You had swelling and it worsens. °SEEK IMMEDIATE MEDICAL CARE IF:  °· You develop a fever. °· You develop new abdominal pain. °· You develop difficulty breathing. °· You develop confusion. °· You have bleeding from the mouth, stomach, or rectum. °MAKE SURE YOU:  °· Understand these instructions. °· Will watch your condition. °· Will get help right away if you are not doing well or get worse. °Document Released: 07/09/2005 Document Revised: 10/01/2011 Document Reviewed: 02/07/2007 °[employer]® Patient Information ©2014 [employer]. ° °

## 2013-11-02 NOTE — Progress Notes (Signed)
[facility] Social Work note Patient was resting in bed asleep. No family at bedside and patient did not awaken to verbal stimuli. Plan for patient to return home at time of discharge per nsg notes. Homecare SW to continue to follow. [place], [place]  [phone]

## 2013-11-02 NOTE — Discharge Summary (Signed)
Physician Discharge Summary  [name] UJW:[id number] DOB: Apr 28, 1918 DOA: 11/01/2013  PCP: No PCP Per Patient  Admit date: 11/01/2013 Discharge date: 11/04/2013  Time spent: 45 minutes  Recommendations for Outpatient Follow-up:  D/C to [facility].  Discharge Diagnoses:  Principal Problem:   Acute on chronic renal failure Active Problems:   Atrial fibrillation, permanent   Coronary artery disease - S/P remote CABG x 4 (1980's)   Aortic stenosis- moderate to moderate/ severe 04/11/13   Pacemaker - St. Jude, dual ch. programmed VVIR   Ascites   Hospice care patient   Discharge Condition: stable   Diet recommendation: Comfort feeding as tolerated.  Filed Weights   11/02/13 0500 11/03/13 0508 11/04/13 0400  Weight: 56.427 kg (124 lb 6.4 oz) 57.6 kg (126 lb 15.8 oz) 57.8 kg (127 lb 6.8 oz)    History of present illness:  [name] is a 80 y.o. female with a Past Medical History of chronic diastolic heart failure, history of coronary artery disease status post CABG in the 1980s, permanent atrial fibrillation, severe aortic stenosis, chronic kidney disease, status post permanent pacemaker in place who presents today with the above noted complaint. Patient was discharged from this facility on 09/30/13 after she was admitted with acute on chronic diastolic heart failure, during that admission she had a palliative care consult, as a result she was discharged home with hospice. She returns to the emergency room today accompanied by her healthcare power of attorney-her niece, Lana Fish of nausea and vomiting.  Was being followed by hospice at home. For the past 2-3 days, she has had intermittent nausea, vomiting and diarrhea. she was brought to the emergency room, she was found to have significant worsening of her renal function, a CT of the abdomen showed significant ascites.  Present on Admission:  . Ascites  - Suspected cardiac ascites - worsening in spite of diuretic  therapy.  - Unfortunately with nausea vomiting and worsening renal function, she is clinically dehydrated.  Rehydrated patient with IVF.   - Family agreed for therapeutic paracentesis on 4/13 for comfort purposes.  Two liters was removed on 4/13. - Patient was weak after paracentesis and so was kept over night.  . Vomiting and diarrhea  - Supportive care  - Dehydrated clinically-unfortunately she has a complicated volume status-clinically dry but with worsening ascites  - Gently hydrate, scheduled Zofran,prn Phenergan, attempted clear liquid diet .  Progressed to soft diet.  . Acute on chronic renal failure  - Likely prerenal, given the significant nausea, vomiting and diarrhea.  - started on gentle rehydration. Creatinine improved.  . Aortic stenosis- moderate to moderate/ severe 04/11/13  - Not a candidate for further intervention. On hospice care at home.   . Atrial fibrillation, permanent  - Has a permanent pacemaker in place, not a candidate for anticoagulation. On hospice care, no need to monitor on telemetry.   . Coronary artery disease - S/P remote CABG x 4 (1980's)  - Stable. No further workup   . Chronic diastolic heart failure  - Clinically dry, received gentle hydration. Hold Lasix for now.  Appears improved and stable. - Last EF in September 2014 around 45-50%.    HTN -Patient's BP was in normal range and dropped during admission. -Hydralazine was decreased to BID and Imdur was discontinued. -Would resume these medications if BP rises again.  . Pacemaker - St. Jude, dual ch. programmed VVIR   Palliative Care - Patient at home with hospice services.   -  After long discussion between attending MD and family, apart from supportive care, no further escalation in treatment.  -Family agreed for transition to full comfort status.  [facility]  Procedures:  paracentesis 4/13  Consultations:  none  Discharge Exam: Filed Vitals:    11/04/13 0400  BP: 110/64  Pulse: 72  Temp: 97.6 F (36.4 C)  Resp: 16    Physical Exam General:  Elderly, Lethargic, Lying in bed, she does not speak with me, opens eyes briefly. Head:  AT/[place] CV: Irreg irreg 3/6 systolic murmur Resp:  CTA no deep inspiration, no w/c/r Abdomen:  Soft, nt, nd, +bs Extremities:  Warm with no swelling.       Discharge Orders   Future Orders Complete By Expires   Diet - low sodium heart healthy  As directed    Diet - low sodium heart healthy  As directed    Diet general  As directed    Increase activity slowly  As directed        Medication List    STOP taking these medications       donepezil 5 MG tablet  Commonly known as:  ARICEPT     furosemide 80 MG tablet  Commonly known as:  LASIX     hydrocortisone 2.5 % rectal cream  Commonly known as:  PROCTOSOL HC     hydrocortisone-pramoxine rectal foam  Commonly known as:  PROCTOFOAM-HC     isosorbide dinitrate 10 MG tablet  Commonly known as:  ISORDIL     LORazepam 0.5 MG tablet  Commonly known as:  ATIVAN  Replaced by:  LORazepam 2 MG/ML concentrated solution     nitroGLYCERIN 0.4 MG SL tablet  Commonly known as:  NITROSTAT     pantoprazole 40 MG tablet  Commonly known as:  PROTONIX     potassium chloride SA 20 MEQ tablet  Commonly known as:  KLOR-CON M10     traMADol 50 MG tablet  Commonly known as:  ULTRAM      TAKE these medications       carvedilol 3.125 MG tablet  Commonly known as:  COREG  Take 3.125 mg by mouth 2 (two) times daily.     DSS 100 MG Caps  Take 200 mg by mouth daily.     hydrALAZINE 25 MG tablet  Commonly known as:  APRESOLINE  Take 1 tablet (25 mg total) by mouth 3 (three) times daily.     LORazepam 2 MG/ML concentrated solution  Commonly known as:  ATIVAN  Take 0.3 mLs (0.6 mg total) by mouth every 4 (four) hours as needed for anxiety or sleep.     morphine 20 MG/ML concentrated solution  Commonly known as:  ROXANOL  Take 0.03 mLs (0.6  mg total) by mouth every 2 (two) hours as needed for severe pain.     polyethylene glycol packet  Commonly known as:  MIRALAX / GLYCOLAX  Take 17 g by mouth 2 (two) times daily as needed.     prochlorperazine 10 MG tablet  Commonly known as:  COMPAZINE  Take 10 mg by mouth every 6 (six) hours as needed for nausea or vomiting.     zolpidem 5 MG tablet  Commonly known as:  AMBIEN  Take 1 tablet (5 mg total) by mouth at bedtime as needed for sleep.       Allergies  Allergen Reactions  . Penicillins Swelling  . Simvastatin Other (See Comments)  Myalgia   . Ace Inhibitors Cough  . Amlodipine Besylate Rash  . Aspirin Nausea Only, Rash and Other (See Comments)    GI distress  . Food Other (See Comments)    Berries,tomato  . Nutritional Supplements Other (See Comments)    Unknown.   Follow-up Information   Follow up with Hospice.   Contact information:   Will be followed at home by [facility].       The results of significant diagnostics from this hospitalization (including imaging, microbiology, ancillary and laboratory) are listed below for reference.    Significant Diagnostic Studies: Ct Abdomen Pelvis Wo Contrast  11/01/2013   CLINICAL DATA:  Patient complaining of diarrhea, abdominal pain, vomiting, firm distended abdomen  EXAM: CT ABDOMEN AND PELVIS WITHOUT CONTRAST  TECHNIQUE: Multidetector CT imaging of the abdomen and pelvis was performed following the standard protocol without intravenous contrast.  COMPARISON:  CT ABD/PELV WO CM dated 05/08/2013; DG ABD ACUTE W/CHEST dated 05/08/2013  FINDINGS: Small right pleural effusion. Infiltrate right lower lobe posteriorly. Cardiac enlargement. Heavy coronary arterial calcification. Cardiac pacer leads noted.  Large volume of ascites in the abdomen and pelvis. Liver span not enlarged, at 15 cm. A few hepatic and splenic calcifications suggesting prior granulomatous exposure. Pancreas and adrenal glands negative. Heavy aortic  calcification with mild dilatation mid abdominal aorta 2 3.1 cm. Bilateral renal atrophy. 1 cm high attenuation right renal lesion possibly a complicated cyst. Nonobstructing bilateral renal calculi. 1.5 cm low-attenuation left renal lesion. This is possibly a cyst.  Nonobstructive gas pattern. Ascites extends into small left inguinal and umbilical hernias. Bladder normal. Calcifications in the uterus suggests fibroids. No acute musculoskeletal findings.  IMPRESSION: Large volume of ascites increased from 2014. Small right pleural effusion. Possible pneumonitis right lung base.   Electronically Signed   By: Esperanza Heir M.D.   On: 11/01/2013 17:05    Microbiology: Recent Results (from the past 240 hour(s))  CLOSTRIDIUM DIFFICILE BY PCR     Status: None   Collection Time    11/02/13  1:50 AM      Result Value Ref Range Status   C difficile by pcr NEGATIVE  NEGATIVE Final     Labs: Basic Metabolic Panel:  Recent Labs Lab 11/01/13 1415 11/01/13 2209 11/02/13 0722 11/03/13 0650  Katherine 141  --  141 140  K 4.0  --  4.2 4.0  CL 101  --  102 104  CO2 20  --  19 19  GLUCOSE 118*  --  94 91  BUN 64*  --  64* 63*  CREATININE 3.20* 3.02* 3.07* 3.14*  CALCIUM 8.8  --  8.7 8.6   Liver Function Tests:  Recent Labs Lab 11/01/13 1415  AST 19  ALT 7  ALKPHOS 122*  BILITOT 1.2  PROT 6.7  ALBUMIN 3.6   CBC:  Recent Labs Lab 11/01/13 1415 11/01/13 2209 11/02/13 0722  WBC 3.6* 3.9* 3.7*  NEUTROABS 2.3  --   --   HGB 8.7* 8.6* 8.4*  HCT 25.6* 25.8* 24.3*  MCV 90.1 89.6 89.3  PLT 269 295 278  BNP: BNP (last 3 results)  Recent Labs  05/14/13 0545 09/22/13 1450 09/25/13 1200  PROBNP 14503.0* 27527.0* 70786.7*      Signed:  Conley Canal [phone]  [facility] 11/04/2013, 10:39 AM     Addendum  Patient seen and examined, chart and data base reviewed.  I agree with the above assessment and plan.  For full details please see  Mrs. Algis Downs  PA note.   Clint Lipps, MD [facility] Pager: [phone] 11/04/2013, 6:04 PM

## 2013-11-03 LAB — BASIC METABOLIC PANEL
BUN: 63 mg/dL — ABNORMAL HIGH (ref 6–23)
CALCIUM: 8.6 mg/dL (ref 8.4–10.5)
CO2: 19 mEq/L (ref 19–32)
Chloride: 104 mEq/L (ref 96–112)
Creatinine, Ser: 3.14 mg/dL — ABNORMAL HIGH (ref 0.50–1.10)
GFR calc non Af Amer: 12 mL/min — ABNORMAL LOW (ref >90)
GFR, EST AFRICAN AMERICAN: 14 mL/min — AB (ref >90)
Glucose, Bld: 91 mg/dL (ref 70–99)
Potassium: 4 mEq/L (ref 3.7–5.3)
Sodium: 140 mEq/L (ref 137–147)

## 2013-11-03 MED ORDER — LOPERAMIDE HCL 2 MG PO CAPS: 2.0000 mg | ORAL_CAPSULE | ORAL | Status: DC | PRN

## 2013-11-03 MED ORDER — MORPHINE SULFATE (CONCENTRATE) 20 MG/ML PO SOLN: 0.6000 mg | ORAL | Status: AC | PRN

## 2013-11-03 MED ORDER — HYDRALAZINE HCL 25 MG PO TABS
25.0000 mg | ORAL_TABLET | Freq: Two times a day (BID) | ORAL | Status: DC
  Administered 2013-11-03 – 2013-11-04 (×2): 25 mg via ORAL
  Filled 2013-11-03 (×3): qty 1

## 2013-11-03 MED ORDER — SACCHAROMYCES BOULARDII 250 MG PO CAPS: 250.0000 mg | ORAL_CAPSULE | Freq: Two times a day (BID) | ORAL | Status: DC

## 2013-11-03 MED ORDER — LORAZEPAM 2 MG/ML PO CONC: 0.5000 mg | ORAL | Status: DC | PRN

## 2013-11-03 MED ORDER — ONDANSETRON HCL 4 MG/5ML PO SOLN
4.0000 mg | Freq: Three times a day (TID) | ORAL | Status: DC | PRN
  Filled 2013-11-03: qty 5

## 2013-11-03 MED ORDER — MORPHINE SULFATE 2 MG/ML IJ SOLN: 2.0000 mg | INTRAMUSCULAR | Status: DC | PRN

## 2013-11-03 MED ORDER — MORPHINE SULFATE (CONCENTRATE) 10 MG /0.5 ML PO SOLN
5.0000 mg | ORAL | Status: DC | PRN
Start: 2013-11-03 — End: 2013-11-04

## 2013-11-03 MED ORDER — LORAZEPAM 2 MG/ML PO CONC: 0.5000 mg | ORAL | Status: AC | PRN

## 2013-11-03 MED ORDER — MORPHINE SULFATE (CONCENTRATE) 10 MG /0.5 ML PO SOLN
5.0000 mg | Freq: Two times a day (BID) | ORAL | Status: DC
  Administered 2013-11-03 – 2013-11-04 (×2): 5 mg via ORAL
  Filled 2013-11-03 (×2): qty 0.5

## 2013-11-03 MED ORDER — ZOLPIDEM TARTRATE 5 MG PO TABS: 5.0000 mg | ORAL_TABLET | Freq: Every evening | ORAL | Status: AC | PRN

## 2013-11-03 MED ORDER — SACCHAROMYCES BOULARDII 250 MG PO CAPS
250.0000 mg | ORAL_CAPSULE | Freq: Two times a day (BID) | ORAL | Status: DC
  Administered 2013-11-03 – 2013-11-04 (×2): 250 mg via ORAL
  Filled 2013-11-03 (×4): qty 1

## 2013-11-03 MED ORDER — LORAZEPAM 2 MG/ML PO CONC
1.0000 mg | Freq: Two times a day (BID) | ORAL | Status: DC
  Administered 2013-11-03 – 2013-11-04 (×2): 1 mg via ORAL
  Filled 2013-11-03 (×2): qty 1

## 2013-11-03 NOTE — Clinical Social Work Note (Signed)
CSW left message for Orson Gear, patient's home care SW to inform that per MD patient's family would like patient taken to [facility]. CSW is waiting for call back.  Roddie Mc MSW, [place], [place], [phone]

## 2013-11-03 NOTE — Progress Notes (Signed)
Patients IV is occluded, Dr. Jerral Ralph stated no need to place new IV.

## 2013-11-03 NOTE — Progress Notes (Signed)
[facility] PROGRESS NOTE  [name] ZOX:[id number] DOB: January 26, 1918 DOA: 11/01/2013 PCP: No PCP Per Patient  Assessment/Plan: *Present on Admission:    Palliative - Patient is from home with hospice. - 11/03/13 she is lethargic. - After discussions with family, they have requested residential hospice.  - Planning to d/c to [facility] when a bed is available.  . Ascites  - Suspect cardiac ascites - worsening in spite of diuretic therapy.  - Unfortunately with nausea vomiting and worsening renal function, and clinically appears dehydrated.  - Palliative paracentesis removed 2L of blood tinged fluid. - She was intravascularly depleted on admission and has been receiving gentle IVF.  Diuretics are still on hold. - Will not commence workup on asctic fluid.  - Blood pressure dropped after paracentesis so patient remained overnight.  . Vomiting and diarrhea  - Supportive care.  Appears to have resolved, but patient is too lethargic to take PO at this point. - Gently hydrate, scheduled Zofran,prn Phenergan.  Diet progressed to soft solids.  . Acute on chronic renal failure  - Likely prerenal, given the significant nausea, vomiting and diarrhea.  - Creatinine improved with IVF.  Anticipate that it will rise again when IVF is discontinued as patient is not eating.  . Aortic stenosis- moderate to moderate/ severe 04/11/13  - Not a candidate for further intervention. On hospice care at home.   . Atrial fibrillation, permanent  - Has a permanent pacemaker in place, not a candidate for anticoagulation. On hospice care, no need to monitor on telemetry.   . Coronary artery disease - S/P remote CABG x 4 (1980's)  - Stable. No further workup   . Chronic diastolic heart failure  - Clinically dry, gentle hydration. Hold Lasix for now  - Last EF in September 2014 around 45-50%.   . Pacemaker - St. Jude, dual ch. programmed VVIR   .Goals of care  - DO NOT RESUSCITATE/DO NOT  INTUBATE  - No heroic measures  - Symptom control for nausea vomiting and ascites - family agreeable with paracentesis  - family agreeable to transition to comfort measures and residential hospice placement    DVT Prophylaxis: Prophylactic Heparin  Code Status:  DNR Family Communication: Patient's Niece and Grand daughter. Disposition Plan: plan for d/c to Residential Hospice when bed available.   Consultants:  none  Procedures:  paracentesis  Antibiotics:  none  HPI/Subjective: [name], [name] is a 80 y.o female who presented to the ED with abdominal pain, abdominal distention, and loose stool.  Today she is lethargic and barely arouseable.  Family agrees to d/c to [facility] for [employer].  Active: Filed Vitals:   11/03/13 0508  BP: 101/55  Pulse: 73  Temp: 97.7 F (36.5 C)  Resp: 18    Intake/Output Summary (Last 24 hours) at 11/03/13 1337 Last data filed at 11/02/13 2200  Gross per 24 hour  Intake 870.83 ml  Output      2 ml  Net 868.83 ml   Filed Weights   11/01/13 1831 11/02/13 0500 11/03/13 0508  Weight: 59.693 kg (131 lb 9.6 oz) 56.427 kg (124 lb 6.4 oz) 57.6 kg (126 lb 15.8 oz)    Exam: Physical Exam Nursing note and vitals reviewed.  Head: Normocephalic and atraumatic.  Eyes: Pupils are equal, round, and reactive to light.  Neck: No JVD present.  Cardiovascular: Normal rate and regular rhythm.  Murmur heard.  Respiratory: Effort normal and breath sounds normal. No stridor. No respiratory distress.  She has no wheezes. She exhibits no tenderness.  GI: Bowel sounds are normal. She exhibits no distension. There is no tenderness. There is no rebound and no guarding.  Musculoskeletal: Normal range of motion. She exhibits no tenderness.  Neurological: She is lethargic, hard to awaken.   Skin: Skin is warm and dry. She is not diaphoretic. No pallor.   Data Reviewed: Basic Metabolic Panel:  Recent Labs Lab 11/01/13 1415  11/01/13 2209 11/02/13 0722 11/03/13 0650  NA 141  --  141 140  K 4.0  --  4.2 4.0  CL 101  --  102 104  CO2 20  --  19 19  GLUCOSE 118*  --  94 91  BUN 64*  --  64* 63*  CREATININE 3.20* 3.02* 3.07* 3.14*  CALCIUM 8.8  --  8.7 8.6   Liver Function Tests:  Recent Labs Lab 11/01/13 1415  AST 19  ALT 7  ALKPHOS 122*  BILITOT 1.2  PROT 6.7  ALBUMIN 3.6    Recent Labs Lab 11/01/13 1415 11/01/13 2209 11/02/13 0722  WBC 3.6* 3.9* 3.7*  NEUTROABS 2.3  --   --   HGB 8.7* 8.6* 8.4*  HCT 25.6* 25.8* 24.3*  MCV 90.1 89.6 89.3  PLT 269 295 278   BNP (last 3 results)  Recent Labs  05/14/13 0545 09/22/13 1450 09/25/13 1200  PROBNP 14503.0* 27527.0* 28775.0*   Recent Results (from the past 240 hour(s))  CLOSTRIDIUM DIFFICILE BY PCR     Status: None   Collection Time    11/02/13  1:50 AM      Result Value Ref Range Status   C difficile by pcr NEGATIVE  NEGATIVE Final     Studies: Ct Abdomen Pelvis Wo Contrast  11/01/2013   CLINICAL DATA:  Patient complaining of diarrhea, abdominal pain, vomiting, firm distended abdomen  EXAM: CT ABDOMEN AND PELVIS WITHOUT CONTRAST  TECHNIQUE: Multidetector CT imaging of the abdomen and pelvis was performed following the standard protocol without intravenous contrast.  COMPARISON:  CT ABD/PELV WO CM dated 05/08/2013; DG ABD ACUTE W/CHEST dated 05/08/2013  FINDINGS: Small right pleural effusion. Infiltrate right lower lobe posteriorly. Cardiac enlargement. Heavy coronary arterial calcification. Cardiac pacer leads noted.  Large volume of ascites in the abdomen and pelvis. Liver span not enlarged, at 15 cm. A few hepatic and splenic calcifications suggesting prior granulomatous exposure. Pancreas and adrenal glands negative. Heavy aortic calcification with mild dilatation mid abdominal aorta 2 3.1 cm. Bilateral renal atrophy. 1 cm high attenuation right renal lesion possibly a complicated cyst. Nonobstructing bilateral renal calculi. 1.5 cm  low-attenuation left renal lesion. This is possibly a cyst.  Nonobstructive gas pattern. Ascites extends into small left inguinal and umbilical hernias. Bladder normal. Calcifications in the uterus suggests fibroids. No acute musculoskeletal findings.  IMPRESSION: Large volume of ascites increased from 2014. Small right pleural effusion. Possible pneumonitis right lung base.   Electronically Signed   By: Esperanza Heiraymond  Rubner M.D.   On: 11/01/2013 17:05   [place] Paracentesis  11/02/2013   CLINICAL DATA:  CHF, chronic kidney disease, ascites. Request is made for therapeutic paracentesis up to 2 liters.  EXAM: ULTRASOUND GUIDED THERAPEUTIC  PARACENTESIS  COMPARISON:  None.  PROCEDURE: An ultrasound guided paracentesis was thoroughly discussed with the patient and questions answered. The benefits, risks, alternatives and complications were also discussed. The patient understands and wishes to proceed with the procedure. Written consent was obtained.  Ultrasound was performed to localize and mark an adequate pocket of  fluid in the left lower quadrant of the abdomen. The area was then prepped and draped in the normal sterile fashion. 1% Lidocaine was used for local anesthesia. Under ultrasound guidance a 19 gauge Yueh catheter was introduced. Paracentesis was performed. The catheter was removed and a dressing applied.  Complications: None.  FINDINGS: A total of approximately 2 liters of blood-tinged fluid was removed.  IMPRESSION: Successful ultrasound guided therapeutic paracentesis yielding 2 liters of ascites.  Read by: Jeananne Rama ,P.A.-C.   Electronically Signed   By: Malachy Moan M.D.   On: 11/02/2013 12:43    Scheduled Meds: . carvedilol  3.125 mg Oral BID WC  . donepezil  5 mg Oral QHS  . heparin  5,000 Units Subcutaneous 3 times per day  . hydrALAZINE  25 mg Oral BID  . pantoprazole  40 mg Oral Daily  . polyethylene glycol  17 g Oral Daily  . saccharomyces boulardii  250 mg Oral BID   Continuous  Infusions:    Principal Problem:   Acute on chronic renal failure Active Problems:   Atrial fibrillation, permanent   Coronary artery disease - S/P remote CABG x 4 (1980's)   Aortic stenosis- moderate to moderate/ severe 04/11/13   Pacemaker - St. Jude, dual ch. programmed VVIR   Ascites   Hospice care patient    Time spent: [address], [place] [phone]  [facility]  If 7PM-7AM, please contact night-coverage at [email or web address], password [facility] 11/03/2013, 1:37 PM  LOS: 2 days

## 2013-11-03 NOTE — Clinical Social Work Note (Signed)
Per Shawna Orleans, patient's home care SW, [facility] needs to assess patient for appropriateness. Shawna Orleans will update CSW.  Roddie Mc MSW, [place], [place], [phone]

## 2013-11-03 NOTE — Progress Notes (Signed)
Inpatient RN visit-[facility]  Room-35 [name]  -[facility] RN Visit-Karen Merilynn Finland RN  Follow up visit.   After previous visit with patient, this RN was advsd by hospital SW Beverely Pace, that MD spoke with patient's niece Clydie Braun and recomended  end of life care at [facility] ([facility]) .  Writer spoke with Dr. Jerral Ralph and PA Algis Downs who informed that they discussed via t/c symptom management with Clydie Braun at home and voiced concerns that patient will return to ED for symptom management. Attending is recommending transition to [facility] and niece Clydie Braun is in agreement at this time. [facility] team aware and medical information faxed to [facility] per [facility] SW request.  Please call [facility] @ [phone]-  with any hospice needs.   Thank you. Hansel Starling, RN  [facility]  Hospice Liaison  [phone])

## 2013-11-03 NOTE — Progress Notes (Signed)
Seen and examined, very lethargic, spoke with family-[name]-[facility]-family would like to transition to [facility], optimize comfort measures. Await [facility] placement.

## 2013-11-03 NOTE — Progress Notes (Addendum)
Inpatient RN visit-[facility] Room 35 [facility] [place] RN Visit-Karen Merilynn Finland RN  Related admission to [facility] diagnosis of  Pt is DNR code.  Pt lying in bed, with no s/s of pain or discomfort. Pt roused slightly with continued interaction, staff RN Grover Canavan in room at time of visit. Discussed patient's current status and lethargy, aware and per chart review patient recvd scheduled IV ativan last evening.  AM po scheduled  meds held due to lethargy.  Breakfst tray on over bed table untouched. Per discussion with  CMRN Gavin Pound plan is for patient to discharge later today. No family present. Call placed to patient's niece Clydie Braun [phone])  discussed potential plan for discharge via non emergent transfer back home this afternoon, Clydie Braun voiced understanding and agreement. Clydie Braun informed this RN that she has had recent knee treatment and is unable to come to the hospital at this time, would welcome call from attending provider. Information shared with staff RN and CMRN.  Patient's home medication list, transfer summary and OOF DNR in place on shadow chart.  [facility] will continue to follow after discharge.   Please call [facility] @ [phone]-  with any hospice needs.   Thank you. Hansel Starling, RN  [facility]  Hospice Liaison  [phone])

## 2013-11-04 ENCOUNTER — Other Ambulatory Visit: Payer: Self-pay | Admitting: *Deleted

## 2013-11-04 ENCOUNTER — Telehealth: Payer: Self-pay | Admitting: *Deleted

## 2013-11-04 DIAGNOSIS — I509 Heart failure, unspecified: Secondary | ICD-10-CM

## 2013-11-04 DIAGNOSIS — D649 Anemia, unspecified: Secondary | ICD-10-CM

## 2013-11-04 DIAGNOSIS — I5033 Acute on chronic diastolic (congestive) heart failure: Secondary | ICD-10-CM

## 2013-11-04 NOTE — Progress Notes (Signed)
Palliative Medicine Team consulted 4/12 based on Hospice status-[facility] patient. Chart has been reviewed and plans noted for [facility]. Symptoms are managed and goals have been established. If there is a need for formal PT consultation please call [phone] or re-consult if we can further assistance. [facility] Hospice team is following.   Anderson Malta, DO Palliative Medicine

## 2013-11-04 NOTE — Progress Notes (Signed)
Addendum  Patient seen and examined, chart and data base reviewed.  I agree with the above assessment and plan.  For full details please see Mrs. Algis Downs PA note.   Clint Lipps, MD [facility] Pager: [phone] 11/04/2013, 6:07 PM

## 2013-11-04 NOTE — Telephone Encounter (Signed)
Refills sent for Lorazepam 0.5mg  as directed #30 w/2 refills called to [facility].

## 2013-11-04 NOTE — Progress Notes (Signed)
[facility] PROGRESS NOTE  CEASIA DEFRAIN YHC:[id number] DOB: [birth year] DOA: 11/01/2013 PCP: No PCP Per Patient  Assessment/Plan: Patient was medically stable for d/c to residential hospice on 4/14.  She remained overnight as a bed is pending.   Palliative - Patient is from home with hospice. - 11/03/13 she is lethargic. - After discussions with family, they have requested residential hospice.  - Planning to d/c to [facility] when a bed is available.  . Ascites  - Suspect cardiac ascites - worsening in spite of diuretic therapy.  - Unfortunately with nausea vomiting and worsening renal function, and clinically appears dehydrated.  - Palliative paracentesis removed 2L of blood tinged fluid. - She was intravascularly depleted on admission and has been receiving gentle IVF.  Diuretics are still on hold. - Will not commence workup on asctic fluid.  - Blood pressure dropped after paracentesis so patient remained overnight.  . Vomiting and diarrhea  - Supportive care.  Appears to have resolved, but patient is too lethargic to take PO at this point. - Gently hydrate, scheduled Zofran,prn Phenergan.  Diet progressed to soft solids.  . Acute on chronic renal failure  - Likely prerenal, given the significant nausea, vomiting and diarrhea.  - Creatinine improved with IVF.  Anticipate that it will rise again when IVF is discontinued as patient is not eating.  . Aortic stenosis- moderate to moderate/ severe 04/11/13  - Not a candidate for further intervention. On hospice care at home.   . Atrial fibrillation, permanent  - Has a permanent pacemaker in place, not a candidate for anticoagulation. On hospice care, no need to monitor on telemetry.   . Coronary artery disease - S/P remote CABG x 4 (1980's)  - Stable. No further workup   . Chronic diastolic heart failure  - Clinically dry, gentle hydration. Hold Lasix for now  - Last EF in September 2014 around 45-50%.   . Pacemaker -  St. Jude, dual ch. programmed VVIR   .Goals of care  - DO NOT RESUSCITATE/DO NOT INTUBATE  - No heroic measures  - Symptom control for nausea vomiting and ascites - family agreeable with paracentesis  - family agreeable to transition to comfort measures and residential hospice placement    DVT Prophylaxis: Prophylactic Heparin  Code Status:  DNR Family Communication: Patient's Niece and Grand daughter. Disposition Plan: plan for d/c to Residential Hospice when bed available.   Consultants:  none  Procedures:  paracentesis  Antibiotics:  none  HPI/Subjective: Cassity, Depolo is a 80 y.o female who presented to the ED with abdominal pain, abdominal distention, and loose stool.  Today she is lethargic and barely arouseable.  Family agrees to d/c to [facility] for [employer].  Active: Filed Vitals:   11/04/13 0400  BP: 110/64  Pulse: 72  Temp: 97.6 F (36.4 C)  Resp: 16    Intake/Output Summary (Last 24 hours) at 11/04/13 1044 Last data filed at 11/04/13 1000  Gross per 24 hour  Intake    270 ml  Output      3 ml  Net    267 ml   Filed Weights   11/02/13 0500 11/03/13 0508 11/04/13 0400  Weight: 56.427 kg (124 lb 6.4 oz) 57.6 kg (126 lb 15.8 oz) 57.8 kg (127 lb 6.8 oz)    Exam: Physical Exam General:  Elderly thin, wrapped in several blankets, she doesn't move but gives me one word answers.  Appears comfortable. Head: Normocephalic and atraumatic.  Eyes:  Pupils are equal, round, and reactive to light.  Neck: No JVD present.  Cardiovascular: irreg irreg, 2+ systolic Murmur heard.  Respiratory: Effort normal and breath sounds normal. No stridor. No respiratory distress. She has no wheezes. She exhibits no tenderness.  GI: Bowel sounds are normal. She exhibits no distension. There is no tenderness. There is no rebound and no guarding.  Musculoskeletal: no swelling or tenderness noted. Neurological: she is awake, but does not follow commands,  very HOH   Skin: Skin is warm and dry. She is not diaphoretic. No pallor.   Data Reviewed: Basic Metabolic Panel:  Recent Labs Lab 11/01/13 1415 11/01/13 2209 11/02/13 0722 11/03/13 0650  NA 141  --  141 140  K 4.0  --  4.2 4.0  CL 101  --  102 104  CO2 20  --  19 19  GLUCOSE 118*  --  94 91  BUN 64*  --  64* 63*  CREATININE 3.20* 3.02* 3.07* 3.14*  CALCIUM 8.8  --  8.7 8.6   Liver Function Tests:  Recent Labs Lab 11/01/13 1415  AST 19  ALT 7  ALKPHOS 122*  BILITOT 1.2  PROT 6.7  ALBUMIN 3.6    Recent Labs Lab 11/01/13 1415 11/01/13 2209 11/02/13 0722  WBC 3.6* 3.9* 3.7*  NEUTROABS 2.3  --   --   HGB 8.7* 8.6* 8.4*  HCT 25.6* 25.8* 24.3*  MCV 90.1 89.6 89.3  PLT 269 295 278   BNP (last 3 results)  Recent Labs  05/14/13 0545 09/22/13 1450 09/25/13 1200  PROBNP 14503.0* 27527.0* 28775.0*   Recent Results (from the past 240 hour(s))  CLOSTRIDIUM DIFFICILE BY PCR     Status: None   Collection Time    11/02/13  1:50 AM      Result Value Ref Range Status   C difficile by pcr NEGATIVE  NEGATIVE Final     Studies: US Paracentesis  11/02/2013   CLINICAL DATA:  CHF, chronic kidney disease, ascites. Request is made for therapeutic paracentesis up to 2 liters.  EXAM: ULTRASOUND GUIDED THERAPEUTIC  PARACENTESIS  COMPARISON:  None.  PROCEDURE: An ultrasound guided paracentesis was thoroughly discussed with the patient and questions answered. The benefits, risks, alternatives and complications were also discussed. The patient understands and wishes to proceed with the procedure. Written consent was obtained.  Ultrasound was performed to localize and mark an adequate pocket of fluid in the left lower quadrant of the abdomen. The area was then prepped and draped in the normal sterile fashion. 1% Lidocaine was used for local anesthesia. Under ultrasound guidance a 19 gauge Yueh catheter was introduced. Paracentesis was performed. The catheter was removed and a dressing  applied.  Complications: None.  FINDINGS: A total of approximately 2 liters of blood-tinged fluid was removed.  IMPRESSION: Successful ultrasound guided therapeutic paracentesis yielding 2 liters of ascites.  Read by: Jeananne Rama ,P.A.-C.   Electronically Signed   By: Malachy Moan M.D.   On: 11/02/2013 12:43    Scheduled Meds: . carvedilol  3.125 mg Oral BID WC  . donepezil  5 mg Oral QHS  . heparin  5,000 Units Subcutaneous 3 times per day  . hydrALAZINE  25 mg Oral BID  . LORazepam  1 mg Oral BID  . morphine CONCENTRATE  5 mg Oral BID  . pantoprazole  40 mg Oral Daily  . polyethylene glycol  17 g Oral Daily  . saccharomyces boulardii  250 mg Oral BID   Continuous  Infusions:    Principal Problem:   Acute on chronic renal failure Active Problems:   Atrial fibrillation, permanent   Coronary artery disease - S/P remote CABG x 4 (1980's)   Aortic stenosis- moderate to moderate/ severe 04/11/13   Pacemaker - St. Jude, dual ch. programmed VVIR   Ascites   Hospice care patient    Time spent: [address]30    Candra Wegner [place], [place] [phone]  [facility]  If 7PM-7AM, please contact night-coverage at [email or web address], password [facility] Center[facility]1 11/04/2013, 10:44 AM  LOS: 3 days

## 2013-11-04 NOTE — Progress Notes (Signed)
Patient drifting in and out of sleep during shift, putting sitting up on side of bed without staff assistance, bed alarm sounding. Patient appears to be in a daze and states "i'm ok!" when questioned by nursing staff. Patient in no signs of distress. Placed back in bed, call bell within reach, bed alarm back in place. Will continue to monitor patient.

## 2013-11-04 NOTE — Progress Notes (Signed)
DC to home via ambulance, Clydie Braun (daughter) notified @ this time.

## 2013-11-04 NOTE — Progress Notes (Signed)
Inpatient RN visit-[facility] Room 35 [name] -[facility] RN Visit-Karen Merilynn Finland RN  Related admission to [facility] diagnosis of CHF Pt is DNR code.   Pt alert sitting up in recliner by the window. States "I'm cold", warm blanket applied, patient appreciative, "thats better".  Denied pain, no nonverbal s/s of pain noted.  Spoke with staff, patient ate 25% of meal this am, no N/V. Pt able to take all po medications this am which includes liquid morphine and lorazepam per chart review. Will make [facility] team aware of patient's current status. No family present at this visit.  Patient's home medication list, transfer summary and OOF DNR in place on shadow chart.   Please call [facility] @ [phone] with any hospice needs.   Thank you. Hansel Starling, RN  [facility]  Hospice Liaison  [phone])

## 2013-11-04 NOTE — Clinical Social Work Note (Signed)
Per Md patient ready to DC home. DC packet on chart. RN instructed to call patient's niece when EMS is transporting. Ambulance transport requested. CSW signing off.  Roddie Mc MSW, [place], [place], [phone]

## 2013-11-05 ENCOUNTER — Encounter: Payer: Self-pay | Admitting: Cardiovascular Disease

## 2013-12-02 ENCOUNTER — Encounter: Payer: Self-pay | Admitting: *Deleted

## 2013-12-15 ENCOUNTER — Other Ambulatory Visit: Payer: Self-pay | Admitting: *Deleted

## 2013-12-19 ENCOUNTER — Other Ambulatory Visit: Payer: Self-pay | Admitting: Cardiovascular Disease

## 2013-12-21 NOTE — Telephone Encounter (Signed)
Rx refill denied. Rx was discontinued at hospital discharge

## 2014-01-04 ENCOUNTER — Encounter: Payer: Self-pay | Admitting: *Deleted

## 2014-01-20 DEATH — deceased

## 2014-02-10 ENCOUNTER — Encounter: Payer: Self-pay | Admitting: *Deleted

## 2014-07-24 IMAGING — CT CT ABD-PELV W/O CM
2 of 4 series · 13 of 46 positions shown, 15 images · non-contrast
Comparison: CT ABD/PELV WO CM dated 05/08/2013; DG ABD ACUTE
W/CHEST dated 05/08/2013

CLINICAL DATA: Patient complaining of diarrhea, abdominal pain,
vomiting, firm distended abdomen

EXAM:
CT ABDOMEN AND PELVIS WITHOUT CONTRAST
TECHNIQUE: Multidetector CT imaging of the abdomen and pelvis was performed
following the standard protocol without intravenous contrast.

[Series 2: abd/ pelvis 5.0 mpr · axial · 0.55mm/px · z∈[-769,-384]mm · 10 of 93 slices shown, 12 images]
[im 8/93  soft-tissue]
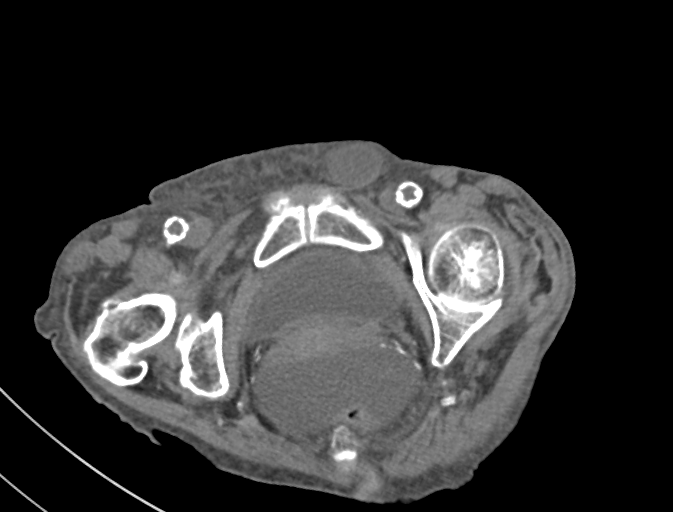
[im 8/93  bone]
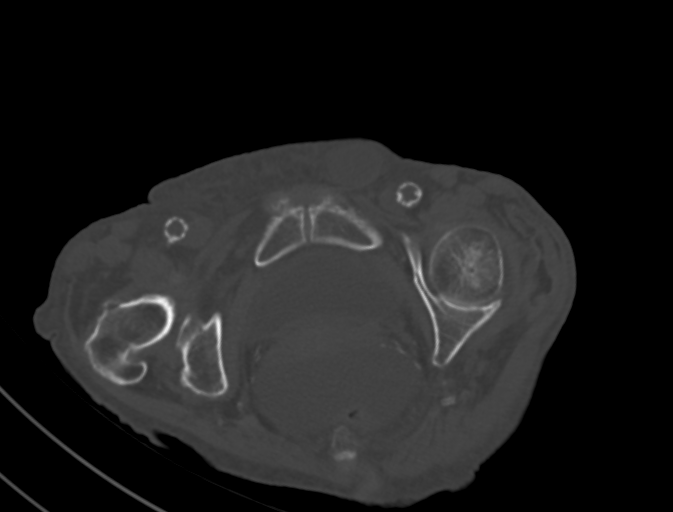
[im 16/93  soft-tissue]
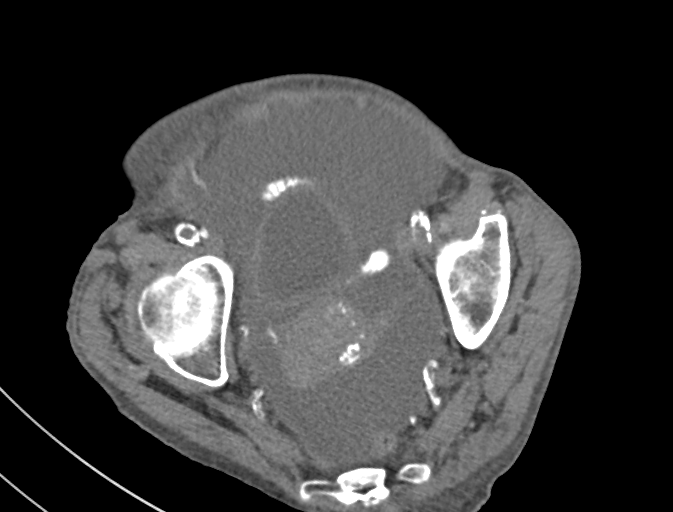
[im 24/93  soft-tissue]
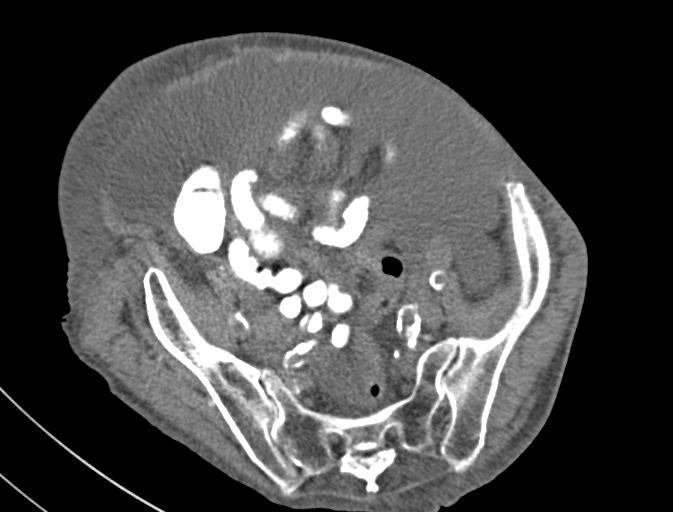
[im 35/93  soft-tissue]
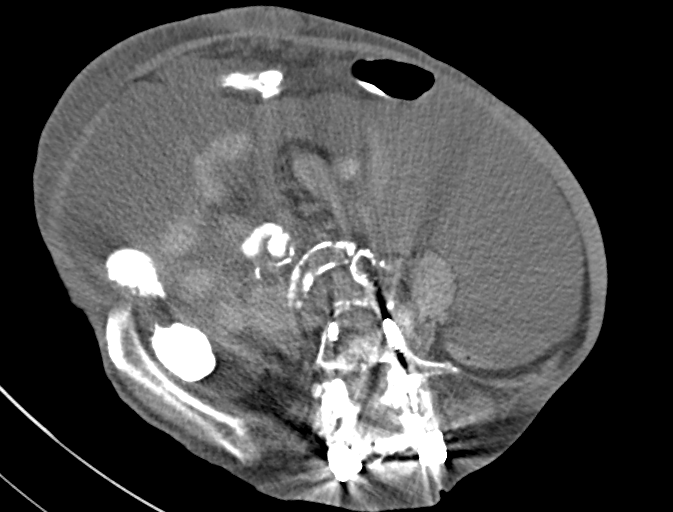
[im 43/93  soft-tissue]
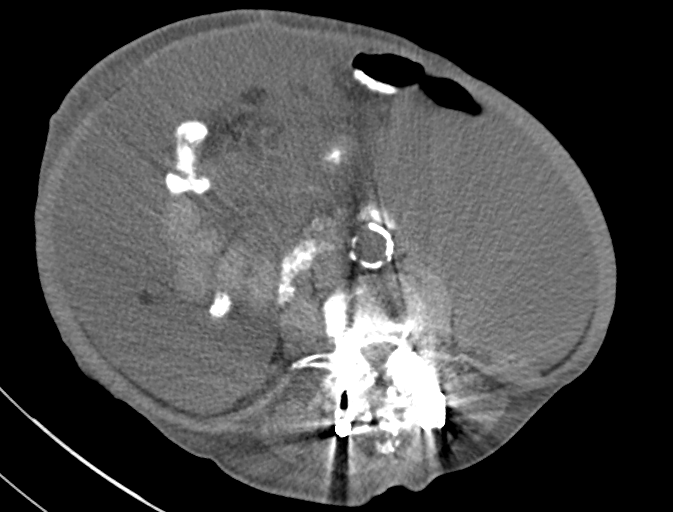
[im 50/93  soft-tissue]
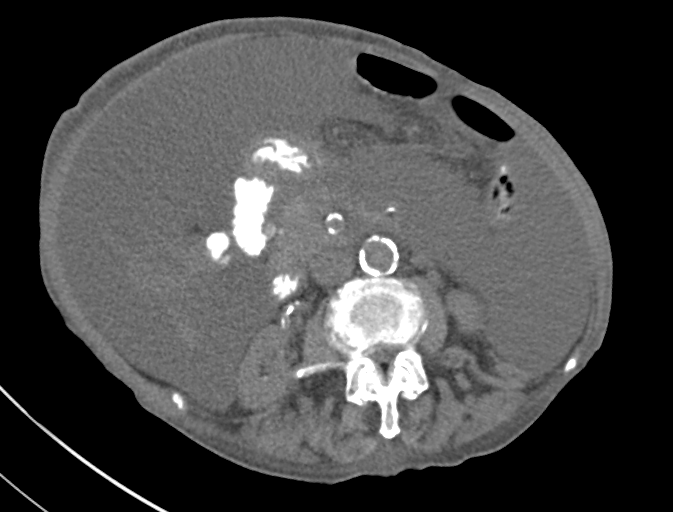
[im 58/93  soft-tissue]
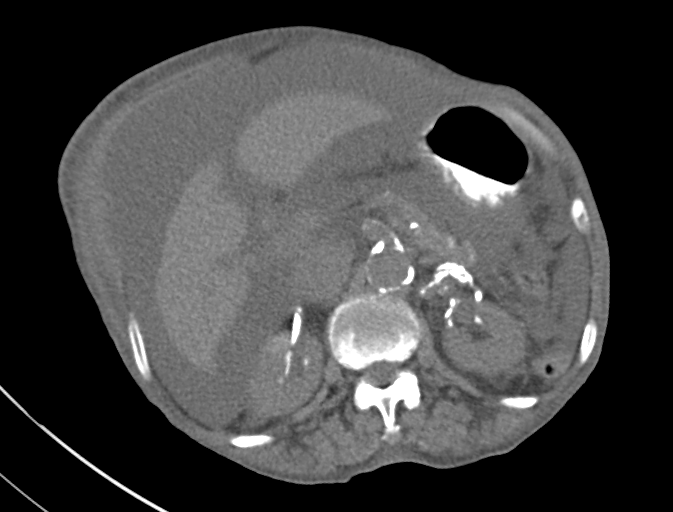
[im 70/93  soft-tissue]
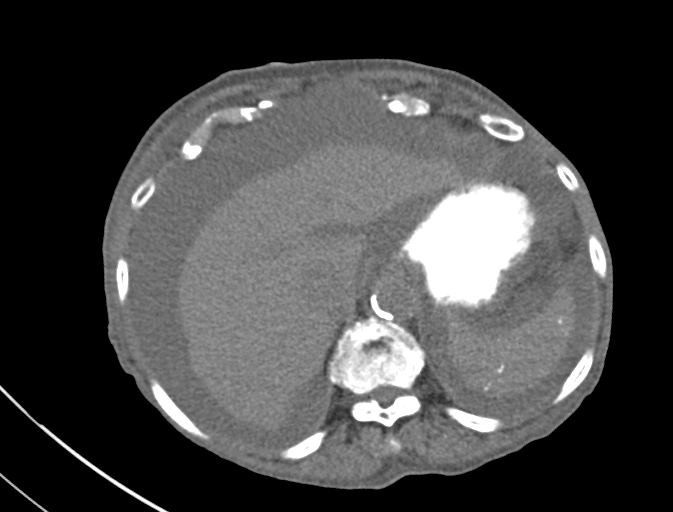
[im 77/93  soft-tissue]
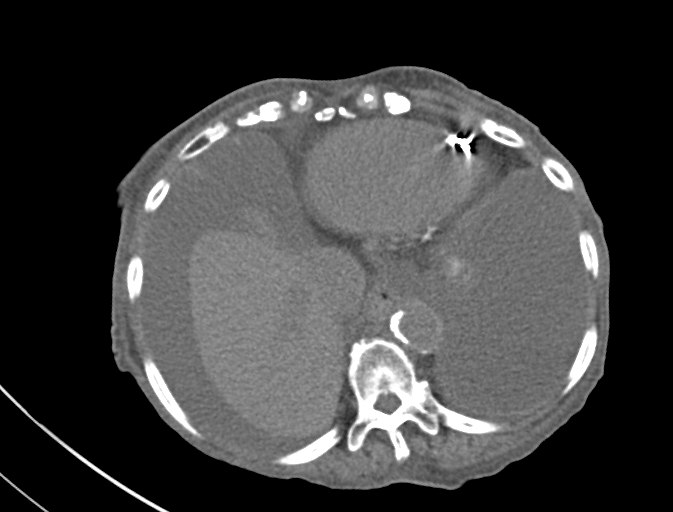
[im 77/93  bone]
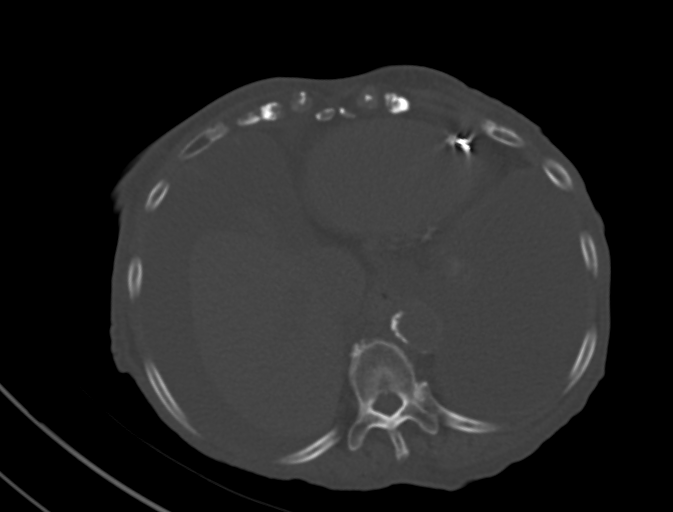
[im 85/93  soft-tissue]
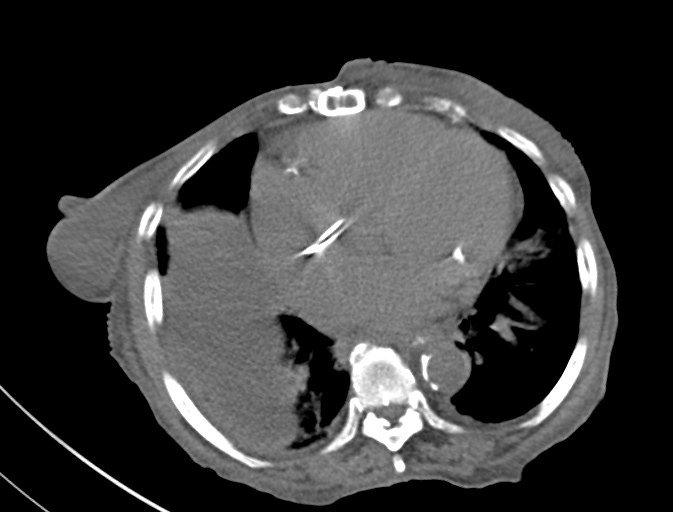

[Series 5: cor · coronal · 0.83mm/px · 3 of 134 slices shown]
[im 45/134  soft-tissue]
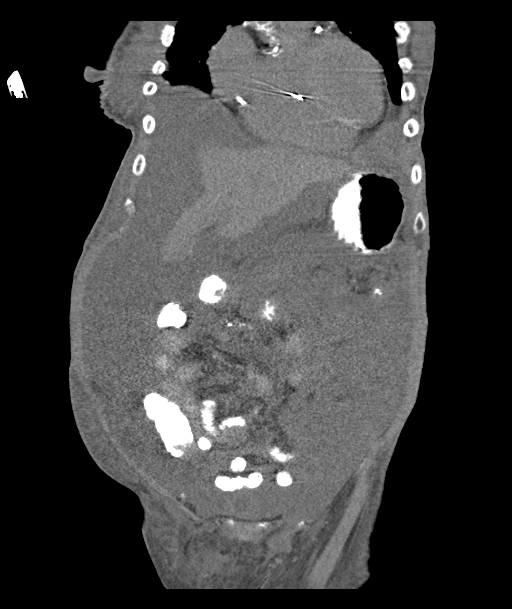
[im 60/134  soft-tissue]
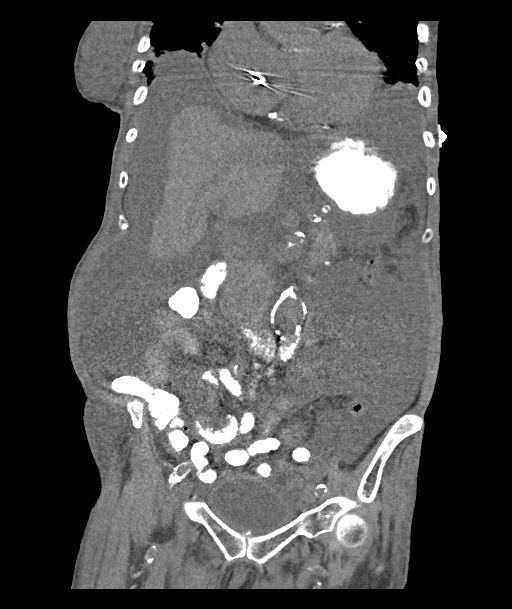
[im 74/134  soft-tissue]
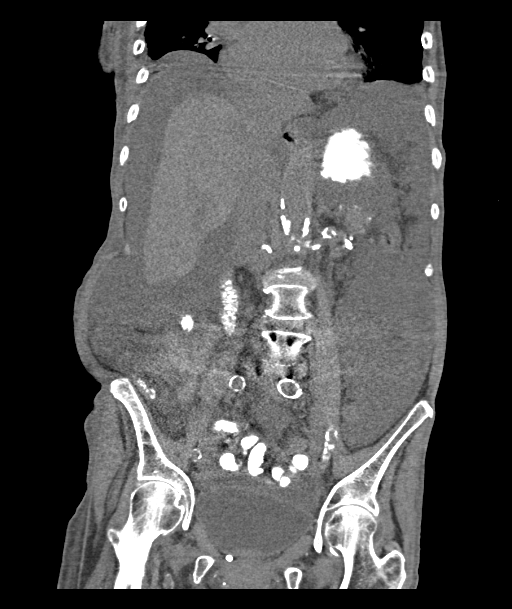

[13 of 46 positions shown; findings below may reference images not displayed]

FINDINGS: Small right pleural effusion. Infiltrate right lower lobe
posteriorly. Cardiac enlargement. Heavy coronary arterial
calcification. Cardiac pacer leads noted.

Large volume of ascites in the abdomen and pelvis. Liver span not
enlarged, at 15 cm. A few hepatic and splenic calcifications
suggesting prior granulomatous exposure. Pancreas and adrenal glands
negative. Heavy aortic calcification with mild dilatation mid
abdominal aorta 2 3.1 cm. Bilateral renal atrophy. 1 cm high
attenuation right renal lesion possibly a complicated cyst.
Nonobstructing bilateral renal calculi. 1.5 cm low-attenuation left
renal lesion. This is possibly a cyst.

Nonobstructive gas pattern. Ascites extends into small left inguinal
and umbilical hernias. Bladder normal. Calcifications in the uterus
suggests fibroids. No acute musculoskeletal findings.
IMPRESSION: Large volume of ascites increased from 5148. Small right pleural
effusion. Possible pneumonitis right lung base.
